# Patient Record
Sex: Female | Born: 1945
Health system: Southern US, Community
[De-identification: ages and names within clinical notes are randomized; demographics above are authoritative.]

## PROBLEM LIST (undated history)

## (undated) DIAGNOSIS — I10 Essential (primary) hypertension: Secondary | ICD-10-CM

## (undated) DIAGNOSIS — E669 Obesity, unspecified: Secondary | ICD-10-CM

## (undated) DIAGNOSIS — R0600 Dyspnea, unspecified: Secondary | ICD-10-CM

## (undated) DIAGNOSIS — J302 Other seasonal allergic rhinitis: Secondary | ICD-10-CM

## (undated) DIAGNOSIS — M069 Rheumatoid arthritis, unspecified: Secondary | ICD-10-CM

## (undated) DIAGNOSIS — J45909 Unspecified asthma, uncomplicated: Secondary | ICD-10-CM

## (undated) DIAGNOSIS — R011 Cardiac murmur, unspecified: Secondary | ICD-10-CM

## (undated) DIAGNOSIS — R7303 Prediabetes: Secondary | ICD-10-CM

## (undated) DIAGNOSIS — K219 Gastro-esophageal reflux disease without esophagitis: Secondary | ICD-10-CM

## (undated) DIAGNOSIS — M1990 Unspecified osteoarthritis, unspecified site: Secondary | ICD-10-CM

## (undated) DIAGNOSIS — I350 Nonrheumatic aortic (valve) stenosis: Secondary | ICD-10-CM

## (undated) DIAGNOSIS — E785 Hyperlipidemia, unspecified: Secondary | ICD-10-CM

## (undated) DIAGNOSIS — I2510 Atherosclerotic heart disease of native coronary artery without angina pectoris: Secondary | ICD-10-CM

## (undated) HISTORY — DX: Essential (primary) hypertension: I10

## (undated) HISTORY — DX: Obesity, unspecified: E66.9

## (undated) HISTORY — DX: Nonrheumatic aortic (valve) stenosis: I35.0

## (undated) HISTORY — DX: Hyperlipidemia, unspecified: E78.5

## (undated) HISTORY — DX: Rheumatoid arthritis, unspecified: M06.9

## (undated) HISTORY — DX: Atherosclerotic heart disease of native coronary artery without angina pectoris: I25.10

---

## 1998-03-30 ENCOUNTER — Other Ambulatory Visit: Admission: RE | Admit: 1998-03-30 | Discharge: 1998-03-30 | Payer: Self-pay | Admitting: Gynecology

## 1999-07-13 ENCOUNTER — Other Ambulatory Visit: Admission: RE | Admit: 1999-07-13 | Discharge: 1999-07-13 | Payer: Self-pay | Admitting: Gynecology

## 2000-09-10 ENCOUNTER — Other Ambulatory Visit: Admission: RE | Admit: 2000-09-10 | Discharge: 2000-09-10 | Payer: Self-pay | Admitting: Gynecology

## 2002-01-08 ENCOUNTER — Other Ambulatory Visit: Admission: RE | Admit: 2002-01-08 | Discharge: 2002-01-08 | Payer: Self-pay | Admitting: Gynecology

## 2003-05-25 ENCOUNTER — Other Ambulatory Visit: Admission: RE | Admit: 2003-05-25 | Discharge: 2003-05-25 | Payer: Self-pay | Admitting: Gynecology

## 2004-08-03 ENCOUNTER — Other Ambulatory Visit: Admission: RE | Admit: 2004-08-03 | Discharge: 2004-08-03 | Payer: Self-pay | Admitting: Gynecology

## 2015-02-28 DIAGNOSIS — E119 Type 2 diabetes mellitus without complications: Secondary | ICD-10-CM | POA: Diagnosis not present

## 2015-02-28 DIAGNOSIS — E782 Mixed hyperlipidemia: Secondary | ICD-10-CM | POA: Diagnosis not present

## 2015-03-04 DIAGNOSIS — E119 Type 2 diabetes mellitus without complications: Secondary | ICD-10-CM | POA: Diagnosis not present

## 2015-03-04 DIAGNOSIS — E782 Mixed hyperlipidemia: Secondary | ICD-10-CM | POA: Diagnosis not present

## 2015-03-04 DIAGNOSIS — I10 Essential (primary) hypertension: Secondary | ICD-10-CM | POA: Diagnosis not present

## 2015-03-04 DIAGNOSIS — Z9181 History of falling: Secondary | ICD-10-CM | POA: Diagnosis not present

## 2015-03-04 DIAGNOSIS — Z1389 Encounter for screening for other disorder: Secondary | ICD-10-CM | POA: Diagnosis not present

## 2015-03-04 DIAGNOSIS — E663 Overweight: Secondary | ICD-10-CM | POA: Diagnosis not present

## 2015-09-01 DIAGNOSIS — E119 Type 2 diabetes mellitus without complications: Secondary | ICD-10-CM | POA: Diagnosis not present

## 2015-09-01 DIAGNOSIS — E782 Mixed hyperlipidemia: Secondary | ICD-10-CM | POA: Diagnosis not present

## 2015-09-07 DIAGNOSIS — E119 Type 2 diabetes mellitus without complications: Secondary | ICD-10-CM | POA: Diagnosis not present

## 2015-09-07 DIAGNOSIS — I10 Essential (primary) hypertension: Secondary | ICD-10-CM | POA: Diagnosis not present

## 2015-09-07 DIAGNOSIS — E782 Mixed hyperlipidemia: Secondary | ICD-10-CM | POA: Diagnosis not present

## 2015-09-28 DIAGNOSIS — H2513 Age-related nuclear cataract, bilateral: Secondary | ICD-10-CM | POA: Diagnosis not present

## 2016-05-18 DIAGNOSIS — B369 Superficial mycosis, unspecified: Secondary | ICD-10-CM | POA: Diagnosis not present

## 2016-05-24 DIAGNOSIS — Z1231 Encounter for screening mammogram for malignant neoplasm of breast: Secondary | ICD-10-CM | POA: Diagnosis not present

## 2016-05-24 DIAGNOSIS — Z01419 Encounter for gynecological examination (general) (routine) without abnormal findings: Secondary | ICD-10-CM | POA: Diagnosis not present

## 2016-09-10 DIAGNOSIS — E119 Type 2 diabetes mellitus without complications: Secondary | ICD-10-CM | POA: Diagnosis not present

## 2016-09-10 DIAGNOSIS — E782 Mixed hyperlipidemia: Secondary | ICD-10-CM | POA: Diagnosis not present

## 2016-09-13 DIAGNOSIS — I10 Essential (primary) hypertension: Secondary | ICD-10-CM | POA: Diagnosis not present

## 2016-09-13 DIAGNOSIS — E119 Type 2 diabetes mellitus without complications: Secondary | ICD-10-CM | POA: Diagnosis not present

## 2016-09-13 DIAGNOSIS — Z139 Encounter for screening, unspecified: Secondary | ICD-10-CM | POA: Diagnosis not present

## 2016-09-13 DIAGNOSIS — Z1389 Encounter for screening for other disorder: Secondary | ICD-10-CM | POA: Diagnosis not present

## 2016-09-13 DIAGNOSIS — E782 Mixed hyperlipidemia: Secondary | ICD-10-CM | POA: Diagnosis not present

## 2016-09-13 DIAGNOSIS — Z9181 History of falling: Secondary | ICD-10-CM | POA: Diagnosis not present

## 2016-09-13 DIAGNOSIS — L239 Allergic contact dermatitis, unspecified cause: Secondary | ICD-10-CM | POA: Diagnosis not present

## 2016-10-16 DIAGNOSIS — Z23 Encounter for immunization: Secondary | ICD-10-CM | POA: Diagnosis not present

## 2016-10-18 DIAGNOSIS — Z23 Encounter for immunization: Secondary | ICD-10-CM | POA: Diagnosis not present

## 2016-10-18 DIAGNOSIS — Z0000 Encounter for general adult medical examination without abnormal findings: Secondary | ICD-10-CM | POA: Diagnosis not present

## 2016-10-18 DIAGNOSIS — Z139 Encounter for screening, unspecified: Secondary | ICD-10-CM | POA: Diagnosis not present

## 2016-10-18 DIAGNOSIS — Z136 Encounter for screening for cardiovascular disorders: Secondary | ICD-10-CM | POA: Diagnosis not present

## 2016-10-18 DIAGNOSIS — E785 Hyperlipidemia, unspecified: Secondary | ICD-10-CM | POA: Diagnosis not present

## 2016-11-01 DIAGNOSIS — H2513 Age-related nuclear cataract, bilateral: Secondary | ICD-10-CM | POA: Diagnosis not present

## 2016-11-01 DIAGNOSIS — E119 Type 2 diabetes mellitus without complications: Secondary | ICD-10-CM | POA: Diagnosis not present

## 2016-11-13 DIAGNOSIS — Z1212 Encounter for screening for malignant neoplasm of rectum: Secondary | ICD-10-CM | POA: Diagnosis not present

## 2016-11-13 DIAGNOSIS — Z1211 Encounter for screening for malignant neoplasm of colon: Secondary | ICD-10-CM | POA: Diagnosis not present

## 2016-11-15 DIAGNOSIS — L821 Other seborrheic keratosis: Secondary | ICD-10-CM | POA: Diagnosis not present

## 2016-11-15 DIAGNOSIS — L570 Actinic keratosis: Secondary | ICD-10-CM | POA: Diagnosis not present

## 2016-11-15 DIAGNOSIS — D225 Melanocytic nevi of trunk: Secondary | ICD-10-CM | POA: Diagnosis not present

## 2016-11-15 DIAGNOSIS — L814 Other melanin hyperpigmentation: Secondary | ICD-10-CM | POA: Diagnosis not present

## 2016-11-15 DIAGNOSIS — Z85828 Personal history of other malignant neoplasm of skin: Secondary | ICD-10-CM | POA: Diagnosis not present

## 2017-03-11 DIAGNOSIS — E782 Mixed hyperlipidemia: Secondary | ICD-10-CM | POA: Diagnosis not present

## 2017-03-11 DIAGNOSIS — E119 Type 2 diabetes mellitus without complications: Secondary | ICD-10-CM | POA: Diagnosis not present

## 2017-03-14 DIAGNOSIS — E782 Mixed hyperlipidemia: Secondary | ICD-10-CM | POA: Diagnosis not present

## 2017-03-14 DIAGNOSIS — I10 Essential (primary) hypertension: Secondary | ICD-10-CM | POA: Diagnosis not present

## 2017-03-14 DIAGNOSIS — Z1331 Encounter for screening for depression: Secondary | ICD-10-CM | POA: Diagnosis not present

## 2017-03-14 DIAGNOSIS — E119 Type 2 diabetes mellitus without complications: Secondary | ICD-10-CM | POA: Diagnosis not present

## 2017-03-14 DIAGNOSIS — Z9181 History of falling: Secondary | ICD-10-CM | POA: Diagnosis not present

## 2017-06-27 DIAGNOSIS — Z1231 Encounter for screening mammogram for malignant neoplasm of breast: Secondary | ICD-10-CM | POA: Diagnosis not present

## 2017-06-27 DIAGNOSIS — Z01419 Encounter for gynecological examination (general) (routine) without abnormal findings: Secondary | ICD-10-CM | POA: Diagnosis not present

## 2017-09-16 DIAGNOSIS — E782 Mixed hyperlipidemia: Secondary | ICD-10-CM | POA: Diagnosis not present

## 2017-09-16 DIAGNOSIS — E119 Type 2 diabetes mellitus without complications: Secondary | ICD-10-CM | POA: Diagnosis not present

## 2017-09-26 DIAGNOSIS — E119 Type 2 diabetes mellitus without complications: Secondary | ICD-10-CM | POA: Diagnosis not present

## 2017-09-26 DIAGNOSIS — I10 Essential (primary) hypertension: Secondary | ICD-10-CM | POA: Diagnosis not present

## 2017-09-26 DIAGNOSIS — E782 Mixed hyperlipidemia: Secondary | ICD-10-CM | POA: Diagnosis not present

## 2017-09-26 DIAGNOSIS — Z23 Encounter for immunization: Secondary | ICD-10-CM | POA: Diagnosis not present

## 2017-09-26 DIAGNOSIS — Z139 Encounter for screening, unspecified: Secondary | ICD-10-CM | POA: Diagnosis not present

## 2017-09-26 DIAGNOSIS — M8580 Other specified disorders of bone density and structure, unspecified site: Secondary | ICD-10-CM | POA: Diagnosis not present

## 2017-10-21 DIAGNOSIS — Z139 Encounter for screening, unspecified: Secondary | ICD-10-CM | POA: Diagnosis not present

## 2017-10-21 DIAGNOSIS — E785 Hyperlipidemia, unspecified: Secondary | ICD-10-CM | POA: Diagnosis not present

## 2017-10-21 DIAGNOSIS — Z0000 Encounter for general adult medical examination without abnormal findings: Secondary | ICD-10-CM | POA: Diagnosis not present

## 2017-10-21 DIAGNOSIS — Z136 Encounter for screening for cardiovascular disorders: Secondary | ICD-10-CM | POA: Diagnosis not present

## 2017-10-21 DIAGNOSIS — Z9181 History of falling: Secondary | ICD-10-CM | POA: Diagnosis not present

## 2017-11-21 DIAGNOSIS — L570 Actinic keratosis: Secondary | ICD-10-CM | POA: Diagnosis not present

## 2017-11-21 DIAGNOSIS — C44519 Basal cell carcinoma of skin of other part of trunk: Secondary | ICD-10-CM | POA: Diagnosis not present

## 2017-11-21 DIAGNOSIS — L814 Other melanin hyperpigmentation: Secondary | ICD-10-CM | POA: Diagnosis not present

## 2017-11-21 DIAGNOSIS — D0471 Carcinoma in situ of skin of right lower limb, including hip: Secondary | ICD-10-CM | POA: Diagnosis not present

## 2017-11-21 DIAGNOSIS — D1801 Hemangioma of skin and subcutaneous tissue: Secondary | ICD-10-CM | POA: Diagnosis not present

## 2017-11-21 DIAGNOSIS — L918 Other hypertrophic disorders of the skin: Secondary | ICD-10-CM | POA: Diagnosis not present

## 2017-11-21 DIAGNOSIS — Z85828 Personal history of other malignant neoplasm of skin: Secondary | ICD-10-CM | POA: Diagnosis not present

## 2018-01-09 DIAGNOSIS — C44722 Squamous cell carcinoma of skin of right lower limb, including hip: Secondary | ICD-10-CM | POA: Diagnosis not present

## 2018-01-09 DIAGNOSIS — C44519 Basal cell carcinoma of skin of other part of trunk: Secondary | ICD-10-CM | POA: Diagnosis not present

## 2018-03-25 DIAGNOSIS — E782 Mixed hyperlipidemia: Secondary | ICD-10-CM | POA: Diagnosis not present

## 2018-03-25 DIAGNOSIS — E119 Type 2 diabetes mellitus without complications: Secondary | ICD-10-CM | POA: Diagnosis not present

## 2018-03-31 DIAGNOSIS — E119 Type 2 diabetes mellitus without complications: Secondary | ICD-10-CM | POA: Diagnosis not present

## 2018-03-31 DIAGNOSIS — E782 Mixed hyperlipidemia: Secondary | ICD-10-CM | POA: Diagnosis not present

## 2018-03-31 DIAGNOSIS — I10 Essential (primary) hypertension: Secondary | ICD-10-CM | POA: Diagnosis not present

## 2018-03-31 DIAGNOSIS — M25541 Pain in joints of right hand: Secondary | ICD-10-CM | POA: Diagnosis not present

## 2018-03-31 DIAGNOSIS — M8580 Other specified disorders of bone density and structure, unspecified site: Secondary | ICD-10-CM | POA: Diagnosis not present

## 2018-04-10 DIAGNOSIS — M79643 Pain in unspecified hand: Secondary | ICD-10-CM | POA: Diagnosis not present

## 2018-04-10 DIAGNOSIS — M8580 Other specified disorders of bone density and structure, unspecified site: Secondary | ICD-10-CM | POA: Diagnosis not present

## 2018-04-10 DIAGNOSIS — M19042 Primary osteoarthritis, left hand: Secondary | ICD-10-CM | POA: Diagnosis not present

## 2018-04-10 DIAGNOSIS — R768 Other specified abnormal immunological findings in serum: Secondary | ICD-10-CM | POA: Diagnosis not present

## 2018-04-10 DIAGNOSIS — M1990 Unspecified osteoarthritis, unspecified site: Secondary | ICD-10-CM | POA: Diagnosis not present

## 2018-04-10 DIAGNOSIS — M8589 Other specified disorders of bone density and structure, multiple sites: Secondary | ICD-10-CM | POA: Diagnosis not present

## 2018-04-10 DIAGNOSIS — M79641 Pain in right hand: Secondary | ICD-10-CM | POA: Diagnosis not present

## 2018-04-10 DIAGNOSIS — M79642 Pain in left hand: Secondary | ICD-10-CM | POA: Diagnosis not present

## 2018-04-10 DIAGNOSIS — M064 Inflammatory polyarthropathy: Secondary | ICD-10-CM | POA: Diagnosis not present

## 2018-04-10 DIAGNOSIS — M19041 Primary osteoarthritis, right hand: Secondary | ICD-10-CM | POA: Diagnosis not present

## 2018-04-17 DIAGNOSIS — R768 Other specified abnormal immunological findings in serum: Secondary | ICD-10-CM | POA: Diagnosis not present

## 2018-05-15 DIAGNOSIS — M79643 Pain in unspecified hand: Secondary | ICD-10-CM | POA: Diagnosis not present

## 2018-05-15 DIAGNOSIS — R768 Other specified abnormal immunological findings in serum: Secondary | ICD-10-CM | POA: Diagnosis not present

## 2018-05-15 DIAGNOSIS — M8580 Other specified disorders of bone density and structure, unspecified site: Secondary | ICD-10-CM | POA: Diagnosis not present

## 2018-05-15 DIAGNOSIS — M1990 Unspecified osteoarthritis, unspecified site: Secondary | ICD-10-CM | POA: Diagnosis not present

## 2018-05-15 DIAGNOSIS — M064 Inflammatory polyarthropathy: Secondary | ICD-10-CM | POA: Diagnosis not present

## 2018-05-30 DIAGNOSIS — R768 Other specified abnormal immunological findings in serum: Secondary | ICD-10-CM | POA: Diagnosis not present

## 2018-05-30 DIAGNOSIS — M1990 Unspecified osteoarthritis, unspecified site: Secondary | ICD-10-CM | POA: Diagnosis not present

## 2018-05-30 DIAGNOSIS — M8580 Other specified disorders of bone density and structure, unspecified site: Secondary | ICD-10-CM | POA: Diagnosis not present

## 2018-05-30 DIAGNOSIS — M79643 Pain in unspecified hand: Secondary | ICD-10-CM | POA: Diagnosis not present

## 2018-05-30 DIAGNOSIS — M064 Inflammatory polyarthropathy: Secondary | ICD-10-CM | POA: Diagnosis not present

## 2018-08-14 DIAGNOSIS — Z1231 Encounter for screening mammogram for malignant neoplasm of breast: Secondary | ICD-10-CM | POA: Diagnosis not present

## 2018-08-21 DIAGNOSIS — N390 Urinary tract infection, site not specified: Secondary | ICD-10-CM | POA: Diagnosis not present

## 2018-08-28 DIAGNOSIS — M79643 Pain in unspecified hand: Secondary | ICD-10-CM | POA: Diagnosis not present

## 2018-08-28 DIAGNOSIS — M8580 Other specified disorders of bone density and structure, unspecified site: Secondary | ICD-10-CM | POA: Diagnosis not present

## 2018-08-28 DIAGNOSIS — M1990 Unspecified osteoarthritis, unspecified site: Secondary | ICD-10-CM | POA: Diagnosis not present

## 2018-08-28 DIAGNOSIS — M064 Inflammatory polyarthropathy: Secondary | ICD-10-CM | POA: Diagnosis not present

## 2018-08-28 DIAGNOSIS — R768 Other specified abnormal immunological findings in serum: Secondary | ICD-10-CM | POA: Diagnosis not present

## 2018-09-29 DIAGNOSIS — E119 Type 2 diabetes mellitus without complications: Secondary | ICD-10-CM | POA: Diagnosis not present

## 2018-09-29 DIAGNOSIS — E782 Mixed hyperlipidemia: Secondary | ICD-10-CM | POA: Diagnosis not present

## 2018-09-29 DIAGNOSIS — M0600 Rheumatoid arthritis without rheumatoid factor, unspecified site: Secondary | ICD-10-CM | POA: Diagnosis not present

## 2018-10-01 DIAGNOSIS — Z23 Encounter for immunization: Secondary | ICD-10-CM | POA: Diagnosis not present

## 2018-10-01 DIAGNOSIS — M0600 Rheumatoid arthritis without rheumatoid factor, unspecified site: Secondary | ICD-10-CM | POA: Diagnosis not present

## 2018-10-01 DIAGNOSIS — I10 Essential (primary) hypertension: Secondary | ICD-10-CM | POA: Diagnosis not present

## 2018-10-01 DIAGNOSIS — E782 Mixed hyperlipidemia: Secondary | ICD-10-CM | POA: Diagnosis not present

## 2018-10-01 DIAGNOSIS — E119 Type 2 diabetes mellitus without complications: Secondary | ICD-10-CM | POA: Diagnosis not present

## 2018-10-16 DIAGNOSIS — H2513 Age-related nuclear cataract, bilateral: Secondary | ICD-10-CM | POA: Diagnosis not present

## 2018-10-16 DIAGNOSIS — E119 Type 2 diabetes mellitus without complications: Secondary | ICD-10-CM | POA: Diagnosis not present

## 2018-11-06 DIAGNOSIS — Z9181 History of falling: Secondary | ICD-10-CM | POA: Diagnosis not present

## 2018-11-06 DIAGNOSIS — Z139 Encounter for screening, unspecified: Secondary | ICD-10-CM | POA: Diagnosis not present

## 2018-11-06 DIAGNOSIS — E785 Hyperlipidemia, unspecified: Secondary | ICD-10-CM | POA: Diagnosis not present

## 2018-11-06 DIAGNOSIS — Z0000 Encounter for general adult medical examination without abnormal findings: Secondary | ICD-10-CM | POA: Diagnosis not present

## 2018-11-20 DIAGNOSIS — M1990 Unspecified osteoarthritis, unspecified site: Secondary | ICD-10-CM | POA: Diagnosis not present

## 2018-11-20 DIAGNOSIS — M79643 Pain in unspecified hand: Secondary | ICD-10-CM | POA: Diagnosis not present

## 2018-11-20 DIAGNOSIS — M8580 Other specified disorders of bone density and structure, unspecified site: Secondary | ICD-10-CM | POA: Diagnosis not present

## 2018-11-20 DIAGNOSIS — M064 Inflammatory polyarthropathy: Secondary | ICD-10-CM | POA: Diagnosis not present

## 2018-11-20 DIAGNOSIS — R768 Other specified abnormal immunological findings in serum: Secondary | ICD-10-CM | POA: Diagnosis not present

## 2019-01-01 DIAGNOSIS — M0600 Rheumatoid arthritis without rheumatoid factor, unspecified site: Secondary | ICD-10-CM | POA: Diagnosis not present

## 2019-01-01 DIAGNOSIS — M1990 Unspecified osteoarthritis, unspecified site: Secondary | ICD-10-CM | POA: Diagnosis not present

## 2019-01-01 DIAGNOSIS — M8580 Other specified disorders of bone density and structure, unspecified site: Secondary | ICD-10-CM | POA: Diagnosis not present

## 2019-01-01 DIAGNOSIS — R768 Other specified abnormal immunological findings in serum: Secondary | ICD-10-CM | POA: Diagnosis not present

## 2019-01-01 DIAGNOSIS — Z79899 Other long term (current) drug therapy: Secondary | ICD-10-CM | POA: Diagnosis not present

## 2019-01-08 DIAGNOSIS — D225 Melanocytic nevi of trunk: Secondary | ICD-10-CM | POA: Diagnosis not present

## 2019-01-08 DIAGNOSIS — Z85828 Personal history of other malignant neoplasm of skin: Secondary | ICD-10-CM | POA: Diagnosis not present

## 2019-01-08 DIAGNOSIS — B078 Other viral warts: Secondary | ICD-10-CM | POA: Diagnosis not present

## 2019-01-08 DIAGNOSIS — L918 Other hypertrophic disorders of the skin: Secondary | ICD-10-CM | POA: Diagnosis not present

## 2019-01-08 DIAGNOSIS — L570 Actinic keratosis: Secondary | ICD-10-CM | POA: Diagnosis not present

## 2019-02-05 DIAGNOSIS — R768 Other specified abnormal immunological findings in serum: Secondary | ICD-10-CM | POA: Diagnosis not present

## 2019-03-30 DIAGNOSIS — E119 Type 2 diabetes mellitus without complications: Secondary | ICD-10-CM | POA: Diagnosis not present

## 2019-03-30 DIAGNOSIS — M0600 Rheumatoid arthritis without rheumatoid factor, unspecified site: Secondary | ICD-10-CM | POA: Diagnosis not present

## 2019-03-30 DIAGNOSIS — E782 Mixed hyperlipidemia: Secondary | ICD-10-CM | POA: Diagnosis not present

## 2019-04-01 DIAGNOSIS — M0600 Rheumatoid arthritis without rheumatoid factor, unspecified site: Secondary | ICD-10-CM | POA: Diagnosis not present

## 2019-04-01 DIAGNOSIS — I10 Essential (primary) hypertension: Secondary | ICD-10-CM | POA: Diagnosis not present

## 2019-04-01 DIAGNOSIS — E119 Type 2 diabetes mellitus without complications: Secondary | ICD-10-CM | POA: Diagnosis not present

## 2019-04-01 DIAGNOSIS — M8580 Other specified disorders of bone density and structure, unspecified site: Secondary | ICD-10-CM | POA: Diagnosis not present

## 2019-04-01 DIAGNOSIS — E782 Mixed hyperlipidemia: Secondary | ICD-10-CM | POA: Diagnosis not present

## 2019-04-02 DIAGNOSIS — M8580 Other specified disorders of bone density and structure, unspecified site: Secondary | ICD-10-CM | POA: Diagnosis not present

## 2019-04-02 DIAGNOSIS — R768 Other specified abnormal immunological findings in serum: Secondary | ICD-10-CM | POA: Diagnosis not present

## 2019-04-02 DIAGNOSIS — M0600 Rheumatoid arthritis without rheumatoid factor, unspecified site: Secondary | ICD-10-CM | POA: Diagnosis not present

## 2019-04-02 DIAGNOSIS — Z79899 Other long term (current) drug therapy: Secondary | ICD-10-CM | POA: Diagnosis not present

## 2019-04-02 DIAGNOSIS — M1990 Unspecified osteoarthritis, unspecified site: Secondary | ICD-10-CM | POA: Diagnosis not present

## 2019-07-02 DIAGNOSIS — Z79899 Other long term (current) drug therapy: Secondary | ICD-10-CM | POA: Diagnosis not present

## 2019-07-02 DIAGNOSIS — M0600 Rheumatoid arthritis without rheumatoid factor, unspecified site: Secondary | ICD-10-CM | POA: Diagnosis not present

## 2019-09-30 DIAGNOSIS — E119 Type 2 diabetes mellitus without complications: Secondary | ICD-10-CM | POA: Diagnosis not present

## 2019-09-30 DIAGNOSIS — E782 Mixed hyperlipidemia: Secondary | ICD-10-CM | POA: Diagnosis not present

## 2019-09-30 DIAGNOSIS — Z79899 Other long term (current) drug therapy: Secondary | ICD-10-CM | POA: Diagnosis not present

## 2019-10-02 DIAGNOSIS — Z23 Encounter for immunization: Secondary | ICD-10-CM | POA: Diagnosis not present

## 2019-10-02 DIAGNOSIS — E119 Type 2 diabetes mellitus without complications: Secondary | ICD-10-CM | POA: Diagnosis not present

## 2019-10-02 DIAGNOSIS — E782 Mixed hyperlipidemia: Secondary | ICD-10-CM | POA: Diagnosis not present

## 2019-10-02 DIAGNOSIS — K219 Gastro-esophageal reflux disease without esophagitis: Secondary | ICD-10-CM | POA: Diagnosis not present

## 2019-10-02 DIAGNOSIS — I10 Essential (primary) hypertension: Secondary | ICD-10-CM | POA: Diagnosis not present

## 2019-11-05 DIAGNOSIS — M0600 Rheumatoid arthritis without rheumatoid factor, unspecified site: Secondary | ICD-10-CM | POA: Diagnosis not present

## 2019-11-05 DIAGNOSIS — M1990 Unspecified osteoarthritis, unspecified site: Secondary | ICD-10-CM | POA: Diagnosis not present

## 2019-11-05 DIAGNOSIS — R768 Other specified abnormal immunological findings in serum: Secondary | ICD-10-CM | POA: Diagnosis not present

## 2019-11-05 DIAGNOSIS — Z79899 Other long term (current) drug therapy: Secondary | ICD-10-CM | POA: Diagnosis not present

## 2019-11-05 DIAGNOSIS — M8580 Other specified disorders of bone density and structure, unspecified site: Secondary | ICD-10-CM | POA: Diagnosis not present

## 2019-12-01 DIAGNOSIS — Z1211 Encounter for screening for malignant neoplasm of colon: Secondary | ICD-10-CM | POA: Diagnosis not present

## 2019-12-01 DIAGNOSIS — Z1212 Encounter for screening for malignant neoplasm of rectum: Secondary | ICD-10-CM | POA: Diagnosis not present

## 2019-12-08 LAB — COLOGUARD: COLOGUARD: NEGATIVE

## 2019-12-17 DIAGNOSIS — E119 Type 2 diabetes mellitus without complications: Secondary | ICD-10-CM | POA: Diagnosis not present

## 2019-12-17 DIAGNOSIS — M1380 Other specified arthritis, unspecified site: Secondary | ICD-10-CM | POA: Diagnosis not present

## 2019-12-17 DIAGNOSIS — H2513 Age-related nuclear cataract, bilateral: Secondary | ICD-10-CM | POA: Diagnosis not present

## 2019-12-17 DIAGNOSIS — Z79899 Other long term (current) drug therapy: Secondary | ICD-10-CM | POA: Diagnosis not present

## 2019-12-24 DIAGNOSIS — Z1231 Encounter for screening mammogram for malignant neoplasm of breast: Secondary | ICD-10-CM | POA: Diagnosis not present

## 2020-01-14 DIAGNOSIS — L308 Other specified dermatitis: Secondary | ICD-10-CM | POA: Diagnosis not present

## 2020-01-14 DIAGNOSIS — Z85828 Personal history of other malignant neoplasm of skin: Secondary | ICD-10-CM | POA: Diagnosis not present

## 2020-01-14 DIAGNOSIS — L814 Other melanin hyperpigmentation: Secondary | ICD-10-CM | POA: Diagnosis not present

## 2020-01-14 DIAGNOSIS — R21 Rash and other nonspecific skin eruption: Secondary | ICD-10-CM | POA: Diagnosis not present

## 2020-01-14 DIAGNOSIS — C44519 Basal cell carcinoma of skin of other part of trunk: Secondary | ICD-10-CM | POA: Diagnosis not present

## 2020-01-14 DIAGNOSIS — L570 Actinic keratosis: Secondary | ICD-10-CM | POA: Diagnosis not present

## 2020-01-14 DIAGNOSIS — D692 Other nonthrombocytopenic purpura: Secondary | ICD-10-CM | POA: Diagnosis not present

## 2020-01-14 DIAGNOSIS — L821 Other seborrheic keratosis: Secondary | ICD-10-CM | POA: Diagnosis not present

## 2020-01-14 DIAGNOSIS — L400 Psoriasis vulgaris: Secondary | ICD-10-CM | POA: Diagnosis not present

## 2020-01-14 DIAGNOSIS — L918 Other hypertrophic disorders of the skin: Secondary | ICD-10-CM | POA: Diagnosis not present

## 2020-02-11 DIAGNOSIS — M1990 Unspecified osteoarthritis, unspecified site: Secondary | ICD-10-CM | POA: Diagnosis not present

## 2020-02-11 DIAGNOSIS — M25519 Pain in unspecified shoulder: Secondary | ICD-10-CM | POA: Diagnosis not present

## 2020-02-11 DIAGNOSIS — Z79899 Other long term (current) drug therapy: Secondary | ICD-10-CM | POA: Diagnosis not present

## 2020-02-11 DIAGNOSIS — M0600 Rheumatoid arthritis without rheumatoid factor, unspecified site: Secondary | ICD-10-CM | POA: Diagnosis not present

## 2020-02-11 DIAGNOSIS — M8580 Other specified disorders of bone density and structure, unspecified site: Secondary | ICD-10-CM | POA: Diagnosis not present

## 2020-02-11 DIAGNOSIS — R768 Other specified abnormal immunological findings in serum: Secondary | ICD-10-CM | POA: Diagnosis not present

## 2020-03-29 DIAGNOSIS — Z79899 Other long term (current) drug therapy: Secondary | ICD-10-CM | POA: Diagnosis not present

## 2020-03-29 DIAGNOSIS — E1169 Type 2 diabetes mellitus with other specified complication: Secondary | ICD-10-CM | POA: Diagnosis not present

## 2020-04-01 DIAGNOSIS — E785 Hyperlipidemia, unspecified: Secondary | ICD-10-CM | POA: Diagnosis not present

## 2020-04-01 DIAGNOSIS — E782 Mixed hyperlipidemia: Secondary | ICD-10-CM | POA: Diagnosis not present

## 2020-04-01 DIAGNOSIS — M7918 Myalgia, other site: Secondary | ICD-10-CM | POA: Diagnosis not present

## 2020-04-01 DIAGNOSIS — Z9181 History of falling: Secondary | ICD-10-CM | POA: Diagnosis not present

## 2020-04-01 DIAGNOSIS — I10 Essential (primary) hypertension: Secondary | ICD-10-CM | POA: Diagnosis not present

## 2020-04-01 DIAGNOSIS — M0600 Rheumatoid arthritis without rheumatoid factor, unspecified site: Secondary | ICD-10-CM | POA: Diagnosis not present

## 2020-04-01 DIAGNOSIS — K219 Gastro-esophageal reflux disease without esophagitis: Secondary | ICD-10-CM | POA: Diagnosis not present

## 2020-04-01 DIAGNOSIS — R011 Cardiac murmur, unspecified: Secondary | ICD-10-CM | POA: Diagnosis not present

## 2020-04-01 DIAGNOSIS — Z139 Encounter for screening, unspecified: Secondary | ICD-10-CM | POA: Diagnosis not present

## 2020-04-01 DIAGNOSIS — E1169 Type 2 diabetes mellitus with other specified complication: Secondary | ICD-10-CM | POA: Diagnosis not present

## 2020-04-04 ENCOUNTER — Other Ambulatory Visit (HOSPITAL_COMMUNITY): Payer: Self-pay | Admitting: Family Medicine

## 2020-04-04 DIAGNOSIS — R011 Cardiac murmur, unspecified: Secondary | ICD-10-CM

## 2020-04-28 DIAGNOSIS — L400 Psoriasis vulgaris: Secondary | ICD-10-CM | POA: Diagnosis not present

## 2020-04-28 DIAGNOSIS — Z85828 Personal history of other malignant neoplasm of skin: Secondary | ICD-10-CM | POA: Diagnosis not present

## 2020-05-06 ENCOUNTER — Other Ambulatory Visit: Payer: Self-pay

## 2020-05-06 ENCOUNTER — Ambulatory Visit (HOSPITAL_COMMUNITY)
Admission: RE | Admit: 2020-05-06 | Discharge: 2020-05-06 | Disposition: A | Discharge status: Home / Self Care | Payer: Medicare Other | Source: Ambulatory Visit | Attending: Family Medicine | Admitting: Family Medicine

## 2020-05-06 DIAGNOSIS — I10 Essential (primary) hypertension: Secondary | ICD-10-CM | POA: Insufficient documentation

## 2020-05-06 DIAGNOSIS — M0600 Rheumatoid arthritis without rheumatoid factor, unspecified site: Secondary | ICD-10-CM | POA: Insufficient documentation

## 2020-05-06 DIAGNOSIS — E1169 Type 2 diabetes mellitus with other specified complication: Secondary | ICD-10-CM | POA: Diagnosis not present

## 2020-05-06 DIAGNOSIS — K219 Gastro-esophageal reflux disease without esophagitis: Secondary | ICD-10-CM | POA: Insufficient documentation

## 2020-05-06 DIAGNOSIS — E782 Mixed hyperlipidemia: Secondary | ICD-10-CM | POA: Diagnosis not present

## 2020-05-06 DIAGNOSIS — E785 Hyperlipidemia, unspecified: Secondary | ICD-10-CM | POA: Diagnosis not present

## 2020-05-06 DIAGNOSIS — R011 Cardiac murmur, unspecified: Secondary | ICD-10-CM

## 2020-05-06 DIAGNOSIS — M7918 Myalgia, other site: Secondary | ICD-10-CM | POA: Diagnosis not present

## 2020-05-06 LAB — ECHOCARDIOGRAM COMPLETE
AR max vel: 0.69 cm2
AV Area VTI: 0.7 cm2
AV Area mean vel: 0.63 cm2
AV Mean grad: 27.6 mmHg
AV Peak grad: 43 mmHg
Ao pk vel: 3.28 m/s
Area-P 1/2: 3.27 cm2
S' Lateral: 2.1 cm

## 2020-05-06 NOTE — Progress Notes (Signed)
  Echocardiogram 2D Echocardiogram has been performed.  Shelley Berg 05/06/2020, 4:04 PM

## 2020-07-05 ENCOUNTER — Other Ambulatory Visit: Payer: Self-pay

## 2020-07-05 ENCOUNTER — Ambulatory Visit: Payer: Medicare Other | Admitting: Cardiology

## 2020-07-05 ENCOUNTER — Encounter: Payer: Self-pay | Admitting: Cardiology

## 2020-07-05 VITALS — BP 138/74 | HR 71 | Ht 59.0 in | Wt 149.0 lb

## 2020-07-05 DIAGNOSIS — E7800 Pure hypercholesterolemia, unspecified: Secondary | ICD-10-CM

## 2020-07-05 DIAGNOSIS — R072 Precordial pain: Secondary | ICD-10-CM | POA: Diagnosis not present

## 2020-07-05 DIAGNOSIS — I350 Nonrheumatic aortic (valve) stenosis: Secondary | ICD-10-CM

## 2020-07-05 DIAGNOSIS — I10 Essential (primary) hypertension: Secondary | ICD-10-CM | POA: Diagnosis not present

## 2020-07-05 MED ORDER — NITROGLYCERIN 0.4 MG SL SUBL: 0.4000 mg | SUBLINGUAL_TABLET | SUBLINGUAL | 0 refills | Status: DC | PRN

## 2020-07-05 NOTE — Progress Notes (Signed)
Cardiology Office Note:    Date:  07/05/2020   ID:  Shelley Berg, DOB 10/16/1945, MRN [id number]  PCP:  Leonides Sake, MD   [facility] Providers Cardiologist:  Kate Sable, MD     Referring MD: Leonides Sake, MD   Chief Complaint  Patient presents with   New Patient (Initial Visit)    Referred by PCP for murmur. Meds reviewed verbally with patient.     History of Present Illness:    Shelley Berg is a 75 y.o. female with a hx of hypertension, hyperlipidemia who presents due to a cardiac murmur.  Patient went to see primary care provider for scheduled visit, murmur was noted on cardiac exam.  States having chest pain/tightness associated with exertion, ongoing over the past year.  Denies shortness of breath, denies edema.  Family history of MI in her dad age 73s, 2 brothers had bypass surgeries around age 20s.  Takes medications as prescribed.  Last cholesterol checked at PCP was noted to be normal.  Past Medical History:  Diagnosis Date   Aortic stenosis    Hyperlipidemia    Hypertension     History reviewed. No pertinent surgical history.  Current Medications: Current Meds  Medication Sig   aspirin 81 MG chewable tablet Chew 81 mg by mouth daily.   augmented betamethasone dipropionate (DIPROLENE-AF) 0.05 % cream Apply 1 application topically 2 (two) times daily.   folic acid (FOLVITE) 1 MG tablet Take 1 mg by mouth daily.   hydroxychloroquine (PLAQUENIL) 200 MG tablet Take 200 mg by mouth 2 (two) times daily.   lisinopril (ZESTRIL) 20 MG tablet Take 20 mg by mouth daily.   loratadine (CLARITIN) 10 MG tablet Claritin 10 mg tablet  Take 1 tablet every day by oral route.   methotrexate (RHEUMATREX) 2.5 MG tablet Take 4 tablets every week   nitroGLYCERIN (NITROSTAT) 0.4 MG SL tablet Place 1 tablet (0.4 mg total) under the tongue every 5 (five) minutes as needed for chest pain. Up to 3 tabs daily maximum.   omeprazole (PRILOSEC) 20 MG capsule Take 20 mg  by mouth daily.   pravastatin (PRAVACHOL) 80 MG tablet Take 80 mg by mouth daily.     Allergies:   Penicillins   Social History   Socioeconomic History   Marital status: Married    Spouse name: Not on file   Number of children: Not on file   Years of education: Not on file   Highest education level: Not on file  Occupational History   Not on file  Tobacco Use   Smoking status: Never    Passive exposure: Never   Smokeless tobacco: Never  Vaping Use   Vaping Use: Never used  Substance and Sexual Activity   Alcohol use: Never   Drug use: Never   Sexual activity: Not on file  Other Topics Concern   Not on file  Social History Narrative   Not on file   Social Determinants of Health   Financial Resource Strain: Not on file  Food Insecurity: Not on file  Transportation Needs: Not on file  Physical Activity: Not on file  Stress: Not on file  Social Connections: Not on file     Family History: The patient's family history includes Heart attack in her father; Stroke in her father and mother.  ROS:   Please see the history of present illness.     All other systems reviewed and are negative.  EKGs/Labs/Other Studies Reviewed:  The following studies were reviewed today:   EKG:  EKG is  ordered today.  The ekg ordered today demonstrates normal sinus rhythm, possible old inferior infarct  Recent Labs: No results found for requested labs within last 8760 hours.  Recent Lipid Panel No results found for: CHOL, TRIG, HDL, CHOLHDL, VLDL, LDLCALC, LDLDIRECT   Risk Assessment/Calculations:        Physical Exam:    VS:  BP 138/74 (BP Location: Right Arm, Patient Position: Sitting, Cuff Size: Normal)   Pulse 71   Ht 4\' 11"  (1.499 m)   Wt 149 lb (67.6 kg)   SpO2 96%   BMI 30.09 kg/m     Wt Readings from Last 3 Encounters:  07/05/20 149 lb (67.6 kg)     GEN:  Well nourished, well developed in no acute distress HEENT: Normal NECK: No JVD; No carotid  bruits LYMPHATICS: No lymphadenopathy CARDIAC: RRR, 2/6 systolic murmur RESPIRATORY:  Clear to auscultation without rales, wheezing or rhonchi  ABDOMEN: Soft, non-tender, non-distended MUSCULOSKELETAL:  No edema; No deformity  SKIN: Warm and dry NEUROLOGIC:  Alert and oriented x 3 PSYCHIATRIC:  Normal affect   ASSESSMENT:    1. Precordial pain   2. Aortic valve stenosis, etiology of cardiac valve disease unspecified   3. Primary hypertension   4. Pure hypercholesterolemia    PLAN:    In order of problems listed above:  Chest pain consistent with angina.  History of hypertension, hyperlipidemia, family history of CAD.  Continue aspirin, statin as prescribed.  Echo with normal systolic function, EF 29%.  Obtain Lexiscan Myoview to evaluate presence of ischemia.  Provide sublingual nitro glycerin as needed in the interim.  Patient advised to obtain stress testing either this week or early next week, states wanting to go on vacation and would like to do testing after. Moderate aortic valve stenosis, likely cause for murmur.  Monitor serially with echocardiograms yearly. Hypertension, BP controlled.  Continue lisinopril. Hyperlipidemia, continue Pravachol.  Obtain cholesterol results from PCP.  Follow-up after stress testing.   Shared Decision Making/Informed Consent The risks [chest pain, shortness of breath, cardiac arrhythmias, dizziness, blood pressure fluctuations, myocardial infarction, stroke/transient ischemic attack, nausea, vomiting, allergic reaction, radiation exposure, metallic taste sensation and life-threatening complications (estimated to be 1 in 10,000)], benefits (risk stratification, diagnosing coronary artery disease, treatment guidance) and alternatives of a nuclear stress test were discussed in detail with Ms. Lundquist and she agrees to proceed.    Medication Adjustments/Labs and Tests Ordered: Current medicines are reviewed at length with the patient today.   Concerns regarding medicines are outlined above.  Orders Placed This Encounter  Procedures   NM Myocar Multi W/Spect W/Wall Motion / EF   EKG 12-Lead    Meds ordered this encounter  Medications   nitroGLYCERIN (NITROSTAT) 0.4 MG SL tablet    Sig: Place 1 tablet (0.4 mg total) under the tongue every 5 (five) minutes as needed for chest pain. Up to 3 tabs daily maximum.    Dispense:  30 tablet    Refill:  0     Patient Instructions  Medication Instructions:   Your physician has recommended you make the following change in your medication:   Take Nitroglycerin 0.4 MG Sublingual every 5 minutes for a maximum of 3 doses as needed for chest pain.  *If you need a refill on your cardiac medications before your next appointment, please call your pharmacy*   Lab Work: None ordered If you have labs (blood work) drawn  today and your tests are completely normal, you will receive your results only by: MyChart Message (if you have MyChart) OR A paper copy in the mail If you have any lab test that is abnormal or we need to change your treatment, we will call you to review the results.   Testing/Procedures:  [place]   Your caregiver has ordered a Stress Test with nuclear imaging. The purpose of this test is to evaluate the blood supply to your heart muscle. This procedure is referred to as a "Non-Invasive Stress Test." This is because other than having an IV started in your vein, nothing is inserted or "invades" your body. Cardiac stress tests are done to find areas of poor blood flow to the heart by determining the extent of coronary artery disease (CAD). Some patients exercise on a treadmill, which naturally increases the blood flow to your heart, while others who are  unable to walk on a treadmill due to physical limitations have a pharmacologic/chemical stress agent called Lexiscan . This medicine will mimic walking on a treadmill by temporarily increasing your coronary blood flow.       PLEASE REPORT TO [facility] MEDICAL MALL ENTRANCE   THE VOLUNTEERS AT THE FIRST DESK WILL DIRECT YOU WHERE TO GO     *Please note: these test may take anywhere between 2-4 hours to complete       Date of Procedure:_____________________________________   Arrival Time for Procedure:______________________________    PLEASE NOTIFY THE OFFICE AT LEAST 24 HOURS IN ADVANCE IF YOU ARE UNABLE TO KEEP YOUR APPOINTMENT.  [place] 24 HOURS IN ADVANCE IF YOU ARE UNABLE TO KEEP YOUR APPOINTMENT. [phone]       How to prepare for your Myoview test:       1. Do not eat or drink after midnight  2. No caffeine for 24 hours prior to test  3. No smoking 24 hours prior to test.  4. Unless instructed otherwise, Take your medication with a small sips of water.    5.         Ladies, please do not wear dresses. Skirts or pants are appropriate. Please wear a short sleeve shirt.  6. No perfume, cologne or lotion.  7. Wear comfortable walking shoes. No heels!     Follow-Up: At [facility], you and your health needs are our priority.  As part of our continuing mission to provide you with exceptional heart care, we have created designated Provider Care Teams.  These Care Teams include your primary Cardiologist (physician) and Advanced Practice Providers (APPs -  Physician Assistants and Nurse Practitioners) who all work together to provide you with the care you need, when you need it.  We recommend signing up for the patient portal called "MyChart".  Sign up information is provided on this After Visit Summary.  MyChart is used to connect with patients for Virtual Visits (Telemedicine).  Patients are able to view lab/test results, encounter notes, upcoming appointments, etc.  Non-urgent messages can be sent to your provider as well.   To learn more about what you can do with MyChart, go to [email or web address].    Your next appointment:   Follow up  after testing   The format for your next appointment:   In Person  Provider:   Kate Sable, MD   Other Instructions    Signed, Kate Sable, MD  07/05/2020 10:37 AM    [place]

## 2020-07-05 NOTE — Patient Instructions (Signed)
Medication Instructions:   Your physician has recommended you make the following change in your medication:   Take Nitroglycerin 0.4 MG Sublingual every 5 minutes for a maximum of 3 doses as needed for chest pain.  *If you need a refill on your cardiac medications before your next appointment, please call your pharmacy*   Lab Work: None ordered If you have labs (blood work) drawn today and your tests are completely normal, you will receive your results only by: [place] (if you have MyChart) OR A paper copy in the mail If you have any lab test that is abnormal or we need to change your treatment, we will call you to review the results.   Testing/Procedures:  [place]   Your caregiver has ordered a Stress Test with nuclear imaging. The purpose of this test is to evaluate the blood supply to your heart muscle. This procedure is referred to as a "Non-Invasive Stress Test." This is because other than having an IV started in your vein, nothing is inserted or "invades" your body. Cardiac stress tests are done to find areas of poor blood flow to the heart by determining the extent of coronary artery disease (CAD). Some patients exercise on a treadmill, which naturally increases the blood flow to your heart, while others who are  unable to walk on a treadmill due to physical limitations have a pharmacologic/chemical stress agent called Lexiscan . This medicine will mimic walking on a treadmill by temporarily increasing your coronary blood flow.      PLEASE REPORT TO [facility] MEDICAL MALL ENTRANCE   THE VOLUNTEERS AT THE FIRST DESK WILL DIRECT YOU WHERE TO GO     *Please note: these test may take anywhere between 2-4 hours to complete       Date of Procedure:_____________________________________   Arrival Time for Procedure:______________________________    PLEASE NOTIFY THE OFFICE AT LEAST 24 HOURS IN ADVANCE IF YOU ARE UNABLE TO KEEP YOUR APPOINTMENT.  [place] 24 HOURS IN ADVANCE IF YOU ARE UNABLE TO KEEP YOUR APPOINTMENT. [phone]       How to prepare for your Myoview test:       1. Do not eat or drink after midnight  2. No caffeine for 24 hours prior to test  3. No smoking 24 hours prior to test.  4. Unless instructed otherwise, Take your medication with a small sips of water.    5.         Ladies, please do not wear dresses. Skirts or pants are appropriate. Please wear a short sleeve shirt.  6. No perfume, cologne or lotion.  7. Wear comfortable walking shoes. No heels!     Follow-Up: At [facility], you and your health needs are our priority.  As part of our continuing mission to provide you with exceptional heart care, we have created designated Provider Care Teams.  These Care Teams include your primary Cardiologist (physician) and Advanced Practice Providers (APPs -  Physician Assistants and Nurse Practitioners) who all work together to provide you with the care you need, when you need it.  We recommend signing up for the patient portal called "MyChart".  Sign up information is provided on this After Visit Summary.  MyChart is used to connect with patients for Virtual Visits (Telemedicine).  Patients are able to view lab/test results, encounter notes, upcoming appointments, etc.  Non-urgent messages can be sent to your provider as well.   To  learn more about what you can do with MyChart, go to [email or web address].    Your next appointment:   Follow up after testing   The format for your next appointment:   In Person  Provider:   Kate Sable, MD   Other Instructions

## 2020-07-28 ENCOUNTER — Other Ambulatory Visit: Payer: Self-pay

## 2020-07-28 ENCOUNTER — Ambulatory Visit
Admission: RE | Admit: 2020-07-28 | Discharge: 2020-07-28 | Disposition: A | Discharge status: Home / Self Care | Payer: Medicare Other | Source: Ambulatory Visit | Attending: Cardiology | Admitting: Cardiology

## 2020-07-28 DIAGNOSIS — M8580 Other specified disorders of bone density and structure, unspecified site: Secondary | ICD-10-CM | POA: Diagnosis not present

## 2020-07-28 DIAGNOSIS — R768 Other specified abnormal immunological findings in serum: Secondary | ICD-10-CM | POA: Diagnosis not present

## 2020-07-28 DIAGNOSIS — M0600 Rheumatoid arthritis without rheumatoid factor, unspecified site: Secondary | ICD-10-CM | POA: Diagnosis not present

## 2020-07-28 DIAGNOSIS — M1990 Unspecified osteoarthritis, unspecified site: Secondary | ICD-10-CM | POA: Diagnosis not present

## 2020-07-28 DIAGNOSIS — R072 Precordial pain: Secondary | ICD-10-CM | POA: Diagnosis not present

## 2020-07-28 DIAGNOSIS — M25519 Pain in unspecified shoulder: Secondary | ICD-10-CM | POA: Diagnosis not present

## 2020-07-28 DIAGNOSIS — Z79899 Other long term (current) drug therapy: Secondary | ICD-10-CM | POA: Diagnosis not present

## 2020-07-28 LAB — NM MYOCAR MULTI W/SPECT W/WALL MOTION / EF
Estimated workload: 1 METS
Exercise duration (min): 0 min
Exercise duration (sec): 0 s
LV dias vol: 82 mL (ref 46–106)
LV sys vol: 32 mL
MPHR: 147 {beats}/min
Peak HR: 90 {beats}/min
Percent HR: 61 %
Rest HR: 66 {beats}/min
SDS: 6
SRS: 9
SSS: 15
TID: 1.16

## 2020-07-28 MED ORDER — TECHNETIUM TC 99M TETROFOSMIN IV KIT
10.9000 | PACK | Freq: Once | INTRAVENOUS | Status: AC | PRN
  Administered 2020-07-28: 10.9 via INTRAVENOUS

## 2020-07-28 MED ORDER — REGADENOSON 0.4 MG/5ML IV SOLN
0.4000 mg | Freq: Once | INTRAVENOUS | Status: AC
  Administered 2020-07-28: 0.4 mg via INTRAVENOUS

## 2020-07-28 MED ORDER — TECHNETIUM TC 99M TETROFOSMIN IV KIT
31.6600 | PACK | Freq: Once | INTRAVENOUS | Status: AC | PRN
  Administered 2020-07-28: 31.66 via INTRAVENOUS

## 2020-08-01 ENCOUNTER — Telehealth: Payer: Self-pay | Admitting: Cardiology

## 2020-08-01 NOTE — Telephone Encounter (Signed)
Called the patient to give stress test results. Lmtcb on the home telephone listed. Tried the patients cell. Unable to lmom, voicemail is not set up.

## 2020-08-01 NOTE — Telephone Encounter (Signed)
Kate Sable, MD  08/01/2020  1:53 PM EDT Back to Top     Stress test was noted to be abnormal.  Please schedule follow-up appointment for patient with myself if schedule permits or an APP for left heart cathconsideration.  Thank you

## 2020-08-02 NOTE — Telephone Encounter (Signed)
Patient returning call.

## 2020-08-02 NOTE — Telephone Encounter (Signed)
Patient made aware of stress test results and Dr. Thereasa Solo recommendation with verbalized understanding. Patients appt with Dr. Garen Lah moved up to 08/08/20 @ 9am.

## 2020-08-08 ENCOUNTER — Encounter: Payer: Self-pay | Admitting: Cardiology

## 2020-08-08 ENCOUNTER — Ambulatory Visit: Payer: Medicare Other | Admitting: Cardiology

## 2020-08-08 ENCOUNTER — Other Ambulatory Visit: Payer: Self-pay

## 2020-08-08 VITALS — BP 138/82 | HR 72 | Ht 59.0 in | Wt 150.0 lb

## 2020-08-08 DIAGNOSIS — R072 Precordial pain: Secondary | ICD-10-CM | POA: Diagnosis not present

## 2020-08-08 DIAGNOSIS — I350 Nonrheumatic aortic (valve) stenosis: Secondary | ICD-10-CM | POA: Diagnosis not present

## 2020-08-08 DIAGNOSIS — E7800 Pure hypercholesterolemia, unspecified: Secondary | ICD-10-CM | POA: Diagnosis not present

## 2020-08-08 DIAGNOSIS — I10 Essential (primary) hypertension: Secondary | ICD-10-CM | POA: Diagnosis not present

## 2020-08-08 MED ORDER — ATORVASTATIN CALCIUM 80 MG PO TABS: 80.0000 mg | ORAL_TABLET | Freq: Every day | ORAL | 5 refills | Status: DC

## 2020-08-08 NOTE — Patient Instructions (Signed)
Medication Instructions:   Your physician has recommended you make the following change in your medication:    STOP taking Pravachol.  2.    START taking Lipitor 80 MG once a day.  *If you need a refill on your cardiac medications before your next appointment, please call your pharmacy*   Lab Work:  CBC and BMP  drawn in office.  Your physician recommends that you return for a FASTING lipid profile: at your earliest [place] will need to be fasting. Please do not have anything to eat or drink after midnight the morning you have the lab work. You may only have water or black coffee with no cream or sugar.  - Please go to the [facility]. You will check in at the front desk to the right as you walk into the atrium. Valet Parking is offered if needed. - No appointment needed. You may go any day between 7 am and 6 pm.   Testing/Procedures:   YAILENE BASSI  08/08/2020  You are scheduled for a Cardiac Catheterization on Wednesday, August 10 with Dr. Kathlyn [place].  1. Please arrive at the [facility] (Main Entrance A) at [facility]: [address] [place], [place] [address] at 10:30 AM (This time is two hours before your procedure to ensure your preparation). Free valet parking service is available.   Special note: Every effort is made to have your procedure done on time. Please understand that emergencies sometimes delay scheduled procedures.  2. Diet: Do not eat solid foods after midnight.  The patient may have clear liquids until 5am upon the day of the procedure.  3. Labs: Drawn In office  4. Medication instructions in preparation for your procedure:   Contrast Allergy: No    Stop taking, HTCZ (Hydrochlorothiazide) Wednesday, August 10,   On the morning of your procedure, take your Aspirin and any morning medicines NOT listed above.  You may use sips of water.  5. Plan for one night stay--bring personal belongings. 6. Bring a current list of  your medications and current insurance cards. 7. You MUST have a responsible person to drive you home. 8. Someone MUST be with you the first 24 hours after you arrive home or your discharge will be delayed. 9. Please wear clothes that are easy to get on and off and wear slip-on shoes.  Thank you for allowing [place] to care for you!   --  Invasive Cardiovascular services    Follow-Up: At [facility], you and your health needs are our priority.  As part of our continuing mission to provide you with exceptional heart care, we have created designated Provider Care Teams.  These Care Teams include your primary Cardiologist (physician) and Advanced Practice Providers (APPs -  Physician Assistants and Nurse Practitioners) who all work together to provide you with the care you need, when you need it.  We recommend signing up for the patient portal called "MyChart".  Sign up information is provided on this After Visit Summary.  MyChart is used to connect with patients for Virtual Visits (Telemedicine).  Patients are able to view lab/test results, encounter notes, upcoming appointments, etc.  Non-urgent messages can be sent to your provider as well.   To learn more about what you can do with MyChart, go to [email or web address].    Your next appointment:    6 weeks  The format for your next appointment:   In Person  Provider:   Kate Sable,  MD   Other Instructions

## 2020-08-08 NOTE — H&P (View-Only) (Signed)
Cardiology Office Note:    Date:  08/08/2020   ID:  Shelley Berg, DOB Nov 17, 1945, MRN [id number]  PCP:  Shelley Sake, MD   [facility] Shelley Berg Cardiologist:  Shelley Sable, MD     Referring MD: Shelley Sake, MD   Chief Complaint  Patient presents with   Follow-up    Follow up to discuss stress test results. Medications verbally reviewed with patient.     History of Present Illness:    Shelley Berg is a 75 y.o. female with a hx of hypertension, hyperlipidemia, moderate aortic valve stenosis who presents for follow-up.  He was last seen due to a cardiac murmur and chest pain.  Murmur due to moderate aortic valve stenosis.  Lexiscan Myoview was performed to evaluate presence of ischemia due to risk factors and symptoms of chest pain.  Now presents for results.  Still has occasional chest discomfort, not bad enough to require nitroglycerin.   Prior notes Echo 05/2020 EF 70 to 75%, moderate aortic stenosis, impaired relaxation  family history of MI in her dad age 44s, 2 brothers had bypass surgeries around age 71s.   Past Medical History:  Diagnosis Date   Aortic stenosis    Hyperlipidemia    Hypertension     History reviewed. No pertinent surgical history.  Current Medications: Current Meds  Medication Sig   aspirin 81 MG chewable tablet Chew 81 mg by mouth daily.   atorvastatin (LIPITOR) 80 MG tablet Take 1 tablet (80 mg total) by mouth daily.   augmented betamethasone dipropionate (DIPROLENE-AF) 0.05 % cream Apply 1 application topically 2 (two) times daily.   folic acid (FOLVITE) 1 MG tablet Take 1 mg by mouth daily.   hydrochlorothiazide (HYDRODIURIL) 25 MG tablet hydrochlorothiazide 25 mg tablet   hydroxychloroquine (PLAQUENIL) 200 MG tablet Take 200 mg by mouth 2 (two) times daily.   lisinopril (ZESTRIL) 20 MG tablet Take 20 mg by mouth daily.   loratadine (CLARITIN) 10 MG tablet Claritin 10 mg tablet  Take 1 tablet every day by oral route.    methotrexate (RHEUMATREX) 2.5 MG tablet Take 4 tablets every week   nitroGLYCERIN (NITROSTAT) 0.4 MG SL tablet Place 1 tablet (0.4 mg total) under the tongue every 5 (five) minutes as needed for chest pain. Up to 3 tabs daily maximum.   omeprazole (PRILOSEC) 20 MG capsule Take 20 mg by mouth daily.   [DISCONTINUED] pravastatin (PRAVACHOL) 80 MG tablet Take 80 mg by mouth daily.     Allergies:   Penicillins   Social History   Socioeconomic History   Marital status: Married    Spouse name: Not on file   Number of children: Not on file   Years of education: Not on file   Highest education level: Not on file  Occupational History   Not on file  Tobacco Use   Smoking status: Never    Passive exposure: Never   Smokeless tobacco: Never  Vaping Use   Vaping Use: Never used  Substance and Sexual Activity   Alcohol use: Never   Drug use: Never   Sexual activity: Not on file  Other Topics Concern   Not on file  Social History Narrative   Not on file   Social Determinants of Health   Financial Resource Strain: Not on file  Food Insecurity: Not on file  Transportation Needs: Not on file  Physical Activity: Not on file  Stress: Not on file  Social Connections: Not on file  Family History: The patient's family history includes Heart attack in her father; Stroke in her father and mother.  ROS:   Please see the history of present illness.     All other systems reviewed and are negative.  EKGs/Labs/Other Studies Reviewed:    The following studies were reviewed today:   EKG:  EKG not  ordered today.    Recent Labs: No results found for requested labs within last 8760 hours.  Recent Lipid Panel No results found for: CHOL, TRIG, HDL, CHOLHDL, VLDL, LDLCALC, LDLDIRECT   Risk Assessment/Calculations:        Physical Exam:    VS:  BP 138/82 (BP Location: Left Arm, Patient Position: Sitting, Cuff Size: Normal)   Pulse 72   Ht '4\' 11"'$  (1.499 m)   Wt 150 lb (68 kg)    SpO2 97%   BMI 30.30 kg/m     Wt Readings from Last 3 Encounters:  08/08/20 150 lb (68 kg)  07/05/20 149 lb (67.6 kg)     GEN:  Well nourished, well developed in no acute distress HEENT: Normal NECK: No JVD; No carotid bruits LYMPHATICS: No lymphadenopathy CARDIAC: RRR, 2/6 systolic murmur RESPIRATORY:  Clear to auscultation without rales, wheezing or rhonchi  ABDOMEN: Soft, non-tender, non-distended MUSCULOSKELETAL:  No edema; No deformity  SKIN: Warm and dry NEUROLOGIC:  Alert and oriented x 3 PSYCHIATRIC:  Normal affect   ASSESSMENT:    1. Precordial pain   2. Aortic valve stenosis, etiology of cardiac valve disease unspecified   3. Primary hypertension   4. Pure hypercholesterolemia     PLAN:    In order of problems listed above:  Chest pain consistent with angina.  History of hypertension, hyperlipidemia, family history of CAD.  Continue aspirin, statin as prescribed.  Echo with normal systolic function, EF XX123456.  Lexiscan Myoview showed inferior lateral wall ischemia.  Three-vessel coronary artery calcification noted on CT attenuated images.  Continue aspirin, stop Pravachol, start Lipitor 80 mg.  Obtain fasting lipid profile.  Schedule left heart cath. Moderate aortic valve stenosis.  Monitor serially with echocardiograms yearly. Hypertension, BP controlled.  Continue lisinopril. Hyperlipidemia, start high intensity statin/Lipitor 80 mg.  Stop Pravachol.  Follow-up after heart cath  Shared Decision Making/Informed Consent The risks [stroke (1 in 1000), death (1 in 1000), kidney failure [usually temporary] (1 in 500), bleeding (1 in 200), allergic reaction [possibly serious] (1 in 200)], benefits (diagnostic support and management of coronary artery disease) and alternatives of a cardiac catheterization were discussed in detail with Shelley Berg and she is willing to proceed.     Medication Adjustments/Labs and Tests Ordered: Current medicines are reviewed at  length with the patient today.  Concerns regarding medicines are outlined above.  Orders Placed This Encounter  Procedures   CBC   Basic metabolic panel   Lipid panel     Meds ordered this encounter  Medications   atorvastatin (LIPITOR) 80 MG tablet    Sig: Take 1 tablet (80 mg total) by mouth daily.    Dispense:  30 tablet    Refill:  5      Patient Instructions  Medication Instructions:   Your physician has recommended you make the following change in your medication:    STOP taking Pravachol.  2.    START taking Lipitor 80 MG once a day.  *If you need a refill on your cardiac medications before your next appointment, please call your pharmacy*   Lab Work:  CBC and BMP  drawn in office.  Your physician recommends that you return for a FASTING lipid profile: at your earliest [place] will need to be fasting. Please do not have anything to eat or drink after midnight the morning you have the lab work. You may only have water or black coffee with no cream or sugar.  - Please go to the [facility]. You will check in at the front desk to the right as you walk into the atrium. Valet Parking is offered if needed. - No appointment needed. You may go any day between 7 am and 6 pm.   Testing/Procedures:   YEWANDE PEYER  08/08/2020  You are scheduled for a Cardiac Catheterization on Wednesday, August 10 with Dr. Kathlyn Sacramento.  1. Please arrive at the [facility] (Main Entrance A) at [facility]: [address] [place], [place] [address] at 10:30 AM (This time is two hours before your procedure to ensure your preparation). Free valet parking service is available.   Special note: Every effort is made to have your procedure done on time. Please understand that emergencies sometimes delay scheduled procedures.  2. Diet: Do not eat solid foods after midnight.  The patient may have clear liquids until 5am upon the day of the procedure.  3. Labs:  Drawn In office  4. Medication instructions in preparation for your procedure:   Contrast Allergy: No    Stop taking, HTCZ (Hydrochlorothiazide) Wednesday, August 10,   On the morning of your procedure, take your Aspirin and any morning medicines NOT listed above.  You may use sips of water.  5. Plan for one night stay--bring personal belongings. 6. Bring a current list of your medications and current insurance cards. 7. You MUST have a responsible person to drive you home. 8. Someone MUST be with you the first 24 hours after you arrive home or your discharge will be delayed. 9. Please wear clothes that are easy to get on and off and wear slip-on shoes.  Thank you for allowing [place] to care for you!   -- [place] [facility]    Follow-Up: At [facility], you and your health needs are our priority.  As part of our continuing mission to provide you with exceptional heart care, we have created designated Provider Care Teams.  These Care Teams include your primary Cardiologist (physician) and Advanced Practice [facility] (APPs -  Physician Assistants and Nurse Practitioners) who all work together to provide you with the care you need, when you need it.  We recommend signing up for the patient portal called "MyChart".  Sign up information is provided on this After Visit Summary.  MyChart is used to connect with patients for Virtual Visits (Telemedicine).  Patients are able to view lab/test results, encounter notes, upcoming appointments, etc.  Non-urgent messages can be sent to your provider as well.   To learn more about what you can do with MyChart, go to [email or web address].    Your next appointment:    6 weeks  The format for your next appointment:   In Person  Provider:   Kate Sable, MD   Other Instructions    Signed, Shelley Sable, MD  08/08/2020 12:21 PM    [place]

## 2020-08-08 NOTE — Progress Notes (Signed)
Cardiology Office Note:    Date:  08/08/2020   ID:  Shelley Berg, DOB [birth year], MRN [id number]  PCP:  Shelley Sake, MD   [facility] Providers Cardiologist:  Shelley Sable, MD     Referring MD: Shelley Sake, MD   Chief Complaint  Patient presents with   Follow-up    Follow up to discuss stress test results. Medications verbally reviewed with patient.     History of Present Illness:    Shelley Berg is a 75 y.o. female with a hx of hypertension, hyperlipidemia, moderate aortic valve stenosis who presents for follow-up.  He was last seen due to a cardiac murmur and chest pain.  Murmur due to moderate aortic valve stenosis.  Lexiscan Myoview was performed to evaluate presence of ischemia due to risk factors and symptoms of chest pain.  Now presents for results.  Still has occasional chest discomfort, not bad enough to require nitroglycerin.   Prior notes Echo 05/2020 EF 70 to 75%, moderate aortic stenosis, impaired relaxation  family history of MI in her dad age 82s, 2 brothers had bypass surgeries around age 44s.   Past Medical History:  Diagnosis Date   Aortic stenosis    Hyperlipidemia    Hypertension     History reviewed. No pertinent surgical history.  Current Medications: Current Meds  Medication Sig   aspirin 81 MG chewable tablet Chew 81 mg by mouth daily.   atorvastatin (LIPITOR) 80 MG tablet Take 1 tablet (80 mg total) by mouth daily.   augmented betamethasone dipropionate (DIPROLENE-AF) 0.05 % cream Apply 1 application topically 2 (two) times daily.   folic acid (FOLVITE) 1 MG tablet Take 1 mg by mouth daily.   hydrochlorothiazide (HYDRODIURIL) 25 MG tablet hydrochlorothiazide 25 mg tablet   hydroxychloroquine (PLAQUENIL) 200 MG tablet Take 200 mg by mouth 2 (two) times daily.   lisinopril (ZESTRIL) 20 MG tablet Take 20 mg by mouth daily.   loratadine (CLARITIN) 10 MG tablet Claritin 10 mg tablet  Take 1 tablet every day by oral route.    methotrexate (RHEUMATREX) 2.5 MG tablet Take 4 tablets every week   nitroGLYCERIN (NITROSTAT) 0.4 MG SL tablet Place 1 tablet (0.4 mg total) under the tongue every 5 (five) minutes as needed for chest pain. Up to 3 tabs daily maximum.   omeprazole (PRILOSEC) 20 MG capsule Take 20 mg by mouth daily.   [DISCONTINUED] pravastatin (PRAVACHOL) 80 MG tablet Take 80 mg by mouth daily.     Allergies:   Penicillins   Social History   Socioeconomic History   Marital status: Married    Spouse name: Not on file   Number of children: Not on file   Years of education: Not on file   Highest education level: Not on file  Occupational History   Not on file  Tobacco Use   Smoking status: Never    Passive exposure: Never   Smokeless tobacco: Never  Vaping Use   Vaping Use: Never used  Substance and Sexual Activity   Alcohol use: Never   Drug use: Never   Sexual activity: Not on file  Other Topics Concern   Not on file  Social History Narrative   Not on file   Social Determinants of Health   Financial Resource Strain: Not on file  Food Insecurity: Not on file  Transportation Needs: Not on file  Physical Activity: Not on file  Stress: Not on file  Social Connections: Not on file  Family History: The patient's family history includes Heart attack in her father; Stroke in her father and mother.  ROS:   Please see the history of present illness.     All other systems reviewed and are negative.  EKGs/Labs/Other Studies Reviewed:    The following studies were reviewed today:   EKG:  EKG not  ordered today.    Recent Labs: No results found for requested labs within last 8760 hours.  Recent Lipid Panel No results found for: CHOL, TRIG, HDL, CHOLHDL, VLDL, LDLCALC, LDLDIRECT   Risk Assessment/Calculations:        Physical Exam:    VS:  BP 138/82 (BP Location: Left Arm, Patient Position: Sitting, Cuff Size: Normal)   Pulse 72   Ht '4\' 11"'$  (1.499 m)   Wt 150 lb (68 kg)    SpO2 97%   BMI 30.30 kg/m     Wt Readings from Last 3 Encounters:  08/08/20 150 lb (68 kg)  07/05/20 149 lb (67.6 kg)     GEN:  Well nourished, well developed in no acute distress HEENT: Normal NECK: No JVD; No carotid bruits LYMPHATICS: No lymphadenopathy CARDIAC: RRR, 2/6 systolic murmur RESPIRATORY:  Clear to auscultation without rales, wheezing or rhonchi  ABDOMEN: Soft, non-tender, non-distended MUSCULOSKELETAL:  No edema; No deformity  SKIN: Warm and dry NEUROLOGIC:  Alert and oriented x 3 PSYCHIATRIC:  Normal affect   ASSESSMENT:    1. Precordial pain   2. Aortic valve stenosis, etiology of cardiac valve disease unspecified   3. Primary hypertension   4. Pure hypercholesterolemia     PLAN:    In order of problems listed above:  Chest pain consistent with angina.  History of hypertension, hyperlipidemia, family history of CAD.  Continue aspirin, statin as prescribed.  Echo with normal systolic function, EF [id number].  Lexiscan Myoview showed inferior lateral wall ischemia.  Three-vessel coronary artery calcification noted on CT attenuated images.  Continue aspirin, stop Pravachol, start Lipitor 80 mg.  Obtain fasting lipid profile.  Schedule left heart cath. Moderate aortic valve stenosis.  Monitor serially with echocardiograms yearly. Hypertension, BP controlled.  Continue lisinopril. Hyperlipidemia, start high intensity statin/Lipitor 80 mg.  Stop Pravachol.  Follow-up after heart cath  Shared Decision Making/Informed Consent The risks [stroke (1 in 1000), death (1 in 1000), kidney failure [usually temporary] (1 in 500), bleeding (1 in 200), allergic reaction [possibly serious] (1 in 200)], benefits (diagnostic support and management of coronary artery disease) and alternatives of a cardiac catheterization were discussed in detail with Shelley Berg and she is willing to proceed.     Medication Adjustments/Labs and Tests Ordered: Current medicines are reviewed at  length with the patient today.  Concerns regarding medicines are outlined above.  Orders Placed This Encounter  Procedures   CBC   Basic metabolic panel   Lipid panel     Meds ordered this encounter  Medications   atorvastatin (LIPITOR) 80 MG tablet    Sig: Take 1 tablet (80 mg total) by mouth daily.    Dispense:  30 tablet    Refill:  5      Patient Instructions  Medication Instructions:   Your physician has recommended you make the following change in your medication:    STOP taking Pravachol.  2.    START taking Lipitor 80 MG once a day.  *If you need a refill on your cardiac medications before your next appointment, please call your pharmacy*   Lab Work:  CBC and BMP  drawn in office.  Your physician recommends that you return for a FASTING lipid profile: at your earliest [place] will need to be fasting. Please do not have anything to eat or drink after midnight the morning you have the lab work. You may only have water or black coffee with no cream or sugar.  - Please go to the [facility]. You will check in at the front desk to the right as you walk into the atrium. Valet Parking is offered if needed. - No appointment needed. You may go any day between 7 am and 6 pm.   Testing/Procedures:   Shelley Berg  08/08/2020  You are scheduled for a Cardiac Catheterization on Wednesday, August 10 with Dr. Kathlyn Sacramento.  1. Please arrive at the [facility] (Main Entrance A) at [facility]: [address] [place], [place] [address] at 10:30 AM (This time is two hours before your procedure to ensure your preparation). Free valet parking service is available.   Special note: Every effort is made to have your procedure done on time. Please understand that emergencies sometimes delay scheduled procedures.  2. Diet: Do not eat solid foods after midnight.  The patient may have clear liquids until 5am upon the day of the procedure.  3. Labs:  Drawn In office  4. Medication instructions in preparation for your procedure:   Contrast Allergy: No    Stop taking, HTCZ (Hydrochlorothiazide) Wednesday, August 10,   On the morning of your procedure, take your Aspirin and any morning medicines NOT listed above.  You may use sips of water.  5. Plan for one night stay--bring personal belongings. 6. Bring a current list of your medications and current insurance cards. 7. You MUST have a responsible person to drive you home. 8. Someone MUST be with you the first 24 hours after you arrive home or your discharge will be delayed. 9. Please wear clothes that are easy to get on and off and wear slip-on shoes.  Thank you for allowing [place] to care for you!   -- [place] [facility]    Follow-Up: At [facility], you and your health needs are our priority.  As part of our continuing mission to provide you with exceptional heart care, we have created designated Provider Care Teams.  These Care Teams include your primary Cardiologist (physician) and Advanced Practice Providers (APPs -  Physician Assistants and Nurse Practitioners) who all work together to provide you with the care you need, when you need it.  We recommend signing up for the patient portal called "MyChart".  Sign up information is provided on this After Visit Summary.  MyChart is used to connect with patients for Virtual Visits (Telemedicine).  Patients are able to view lab/test results, encounter notes, upcoming appointments, etc.  Non-urgent messages can be sent to your provider as well.   To learn more about what you can do with MyChart, go to [email or web address].    Your next appointment:    6 weeks  The format for your next appointment:   In Person  Provider:   Kate Sable, MD   Other Instructions    Signed, Shelley Sable, MD  08/08/2020 12:21 PM    [place]

## 2020-08-09 ENCOUNTER — Telehealth: Payer: Self-pay | Admitting: *Deleted

## 2020-08-09 LAB — BASIC METABOLIC PANEL
BUN/Creatinine Ratio: 15 (ref 12–28)
BUN: 10 mg/dL (ref 8–27)
CO2: 20 mmol/L (ref 20–29)
Calcium: 9.9 mg/dL (ref 8.7–10.3)
Chloride: 104 mmol/L (ref 96–106)
Creatinine, Ser: 0.65 mg/dL (ref 0.57–1.00)
Glucose: 104 mg/dL — ABNORMAL HIGH (ref 65–99)
Potassium: 4.7 mmol/L (ref 3.5–5.2)
Sodium: 142 mmol/L (ref 134–144)
eGFR: 93 mL/min/{1.73_m2} (ref >59)

## 2020-08-09 LAB — CBC
Hematocrit: 42.5 % (ref 34.0–46.6)
Hemoglobin: 14.2 g/dL (ref 11.1–15.9)
MCH: 30.7 pg (ref 26.6–33.0)
MCHC: 33.4 g/dL (ref 31.5–35.7)
MCV: 92 fL (ref 79–97)
Platelets: 334 10*3/uL (ref 150–450)
RBC: 4.62 x10E6/uL (ref 3.77–5.28)
RDW: 13.3 % (ref 11.7–15.4)
WBC: 7.2 10*3/uL (ref 3.4–10.8)

## 2020-08-09 NOTE — Telephone Encounter (Signed)
Cardiac catheterization scheduled at [facility] for: Wednesday August 10, 2020 12:30 PM [facility] Main Entrance A [facility]) at: 10:30 AM   No solid food after midnight prior to cath, clear liquids until 5 AM day of procedure.  Usual morning medications can be taken pre-cath with sips of water including aspirin 81 mg.    Confirmed patient has responsible adult to drive home post procedure and be with patient first 24 hours after arriving home.  Patients are allowed one visitor in the waiting room during the time they are at the hospital for their procedure. Both patient and visitor must wear a mask once they enter the hospital.   Patient reports does not currently have any symptoms concerning for COVID-19 and no household members with COVID-19 like illness.   Reviewed procedure/mask/visitor instructions with patient.

## 2020-08-10 ENCOUNTER — Ambulatory Visit (HOSPITAL_COMMUNITY)
Admission: RE | Admit: 2020-08-10 | Discharge: 2020-08-10 | Disposition: A | Discharge status: Home / Self Care | Payer: Medicare Other | Attending: Cardiovascular Disease | Admitting: Cardiovascular Disease

## 2020-08-10 ENCOUNTER — Telehealth: Payer: Self-pay

## 2020-08-10 ENCOUNTER — Other Ambulatory Visit: Payer: Self-pay

## 2020-08-10 ENCOUNTER — Encounter (HOSPITAL_COMMUNITY)
Admission: RE | Disposition: A | Discharge status: Home / Self Care | Payer: Self-pay | Source: Home / Self Care | Attending: Cardiovascular Disease

## 2020-08-10 DIAGNOSIS — I25118 Atherosclerotic heart disease of native coronary artery with other forms of angina pectoris: Secondary | ICD-10-CM

## 2020-08-10 DIAGNOSIS — Z8249 Family history of ischemic heart disease and other diseases of the circulatory system: Secondary | ICD-10-CM | POA: Diagnosis not present

## 2020-08-10 DIAGNOSIS — Z7982 Long term (current) use of aspirin: Secondary | ICD-10-CM | POA: Diagnosis not present

## 2020-08-10 DIAGNOSIS — Z79899 Other long term (current) drug therapy: Secondary | ICD-10-CM | POA: Insufficient documentation

## 2020-08-10 DIAGNOSIS — Z006 Encounter for examination for normal comparison and control in clinical research program: Secondary | ICD-10-CM

## 2020-08-10 DIAGNOSIS — Z880 Allergy status to penicillin: Secondary | ICD-10-CM | POA: Diagnosis not present

## 2020-08-10 DIAGNOSIS — E7800 Pure hypercholesterolemia, unspecified: Secondary | ICD-10-CM | POA: Diagnosis not present

## 2020-08-10 DIAGNOSIS — I2510 Atherosclerotic heart disease of native coronary artery without angina pectoris: Secondary | ICD-10-CM

## 2020-08-10 DIAGNOSIS — I25119 Atherosclerotic heart disease of native coronary artery with unspecified angina pectoris: Secondary | ICD-10-CM | POA: Insufficient documentation

## 2020-08-10 DIAGNOSIS — I10 Essential (primary) hypertension: Secondary | ICD-10-CM | POA: Insufficient documentation

## 2020-08-10 DIAGNOSIS — I2582 Chronic total occlusion of coronary artery: Secondary | ICD-10-CM | POA: Diagnosis not present

## 2020-08-10 DIAGNOSIS — R072 Precordial pain: Secondary | ICD-10-CM

## 2020-08-10 DIAGNOSIS — I350 Nonrheumatic aortic (valve) stenosis: Secondary | ICD-10-CM

## 2020-08-10 DIAGNOSIS — I272 Pulmonary hypertension, unspecified: Secondary | ICD-10-CM | POA: Insufficient documentation

## 2020-08-10 HISTORY — PX: RIGHT/LEFT HEART CATH AND CORONARY ANGIOGRAPHY: CATH118266

## 2020-08-10 LAB — POCT I-STAT EG7
Acid-base deficit: 1 mmol/L (ref 0.0–2.0)
Bicarbonate: 25.1 mmol/L (ref 20.0–28.0)
Calcium, Ion: 1.28 mmol/L (ref 1.15–1.40)
HCT: 37 % (ref 36.0–46.0)
Hemoglobin: 12.6 g/dL (ref 12.0–15.0)
O2 Saturation: 76 %
Potassium: 3.9 mmol/L (ref 3.5–5.1)
Sodium: 142 mmol/L (ref 135–145)
TCO2: 26 mmol/L (ref 22–32)
pCO2, Ven: 45.9 mmHg (ref 44.0–60.0)
pH, Ven: 7.346 (ref 7.250–7.430)
pO2, Ven: 44 mmHg (ref 32.0–45.0)

## 2020-08-10 LAB — POCT I-STAT 7, (LYTES, BLD GAS, ICA,H+H)
Acid-Base Excess: 1 mmol/L (ref 0.0–2.0)
Bicarbonate: 26 mmol/L (ref 20.0–28.0)
Calcium, Ion: 1.26 mmol/L (ref 1.15–1.40)
HCT: 37 % (ref 36.0–46.0)
Hemoglobin: 12.6 g/dL (ref 12.0–15.0)
O2 Saturation: 100 %
Potassium: 3.9 mmol/L (ref 3.5–5.1)
Sodium: 142 mmol/L (ref 135–145)
TCO2: 27 mmol/L (ref 22–32)
pCO2 arterial: 43.4 mmHg (ref 32.0–48.0)
pH, Arterial: 7.386 (ref 7.350–7.450)
pO2, Arterial: 197 mmHg — ABNORMAL HIGH (ref 83.0–108.0)

## 2020-08-10 SURGERY — RIGHT/LEFT HEART CATH AND CORONARY ANGIOGRAPHY
Anesthesia: LOCAL

## 2020-08-10 MED ORDER — SODIUM CHLORIDE 0.9% FLUSH: 3.0000 mL | Freq: Two times a day (BID) | INTRAVENOUS | Status: DC

## 2020-08-10 MED ORDER — FENTANYL CITRATE (PF) 100 MCG/2ML IJ SOLN
INTRAMUSCULAR | Status: DC | PRN
  Administered 2020-08-10: 25 ug via INTRAVENOUS

## 2020-08-10 MED ORDER — SODIUM CHLORIDE 0.9 % WEIGHT BASED INFUSION
3.0000 mL/kg/h | INTRAVENOUS | Status: AC
  Administered 2020-08-10: 3 mL/kg/h via INTRAVENOUS

## 2020-08-10 MED ORDER — SODIUM CHLORIDE 0.9 % IV SOLN: INTRAVENOUS | Status: DC

## 2020-08-10 MED ORDER — HEPARIN SODIUM (PORCINE) 1000 UNIT/ML IJ SOLN
INTRAMUSCULAR | Status: AC
  Filled 2020-08-10: qty 1

## 2020-08-10 MED ORDER — LIDOCAINE HCL (PF) 1 % IJ SOLN
INTRAMUSCULAR | Status: DC | PRN
  Administered 2020-08-10 (×2): 3 mL

## 2020-08-10 MED ORDER — SODIUM CHLORIDE 0.9 % WEIGHT BASED INFUSION: 1.0000 mL/kg/h | INTRAVENOUS | Status: DC

## 2020-08-10 MED ORDER — VERAPAMIL HCL 2.5 MG/ML IV SOLN
INTRAVENOUS | Status: DC | PRN
  Administered 2020-08-10: 10 mL via INTRA_ARTERIAL

## 2020-08-10 MED ORDER — SODIUM CHLORIDE 0.9 % IV SOLN: 250.0000 mL | INTRAVENOUS | Status: DC | PRN

## 2020-08-10 MED ORDER — HEPARIN (PORCINE) IN NACL 1000-0.9 UT/500ML-% IV SOLN
INTRAVENOUS | Status: DC | PRN
  Administered 2020-08-10: 500 mL

## 2020-08-10 MED ORDER — HEPARIN (PORCINE) IN NACL 1000-0.9 UT/500ML-% IV SOLN
INTRAVENOUS | Status: DC | PRN
Start: 2020-08-10 — End: 2020-08-10
  Administered 2020-08-10: 500 mL

## 2020-08-10 MED ORDER — MIDAZOLAM HCL 2 MG/2ML IJ SOLN
INTRAMUSCULAR | Status: DC | PRN
  Administered 2020-08-10: 1 mg via INTRAVENOUS

## 2020-08-10 MED ORDER — ACETAMINOPHEN 325 MG PO TABS: 650.0000 mg | ORAL_TABLET | ORAL | Status: DC | PRN

## 2020-08-10 MED ORDER — MIDAZOLAM HCL 2 MG/2ML IJ SOLN
INTRAMUSCULAR | Status: AC
  Filled 2020-08-10: qty 2

## 2020-08-10 MED ORDER — FENTANYL CITRATE (PF) 100 MCG/2ML IJ SOLN
INTRAMUSCULAR | Status: AC
  Filled 2020-08-10: qty 2

## 2020-08-10 MED ORDER — SODIUM CHLORIDE 0.9% FLUSH: 3.0000 mL | INTRAVENOUS | Status: DC | PRN

## 2020-08-10 MED ORDER — NITROGLYCERIN 0.4 MG SL SUBL: 0.4000 mg | SUBLINGUAL_TABLET | SUBLINGUAL | Status: DC | PRN

## 2020-08-10 MED ORDER — VERAPAMIL HCL 2.5 MG/ML IV SOLN
INTRAVENOUS | Status: AC
  Filled 2020-08-10: qty 2

## 2020-08-10 MED ORDER — HEPARIN SODIUM (PORCINE) 1000 UNIT/ML IJ SOLN
INTRAMUSCULAR | Status: DC | PRN
  Administered 2020-08-10: 3000 [IU] via INTRAVENOUS

## 2020-08-10 MED ORDER — LIDOCAINE HCL (PF) 1 % IJ SOLN
INTRAMUSCULAR | Status: AC
  Filled 2020-08-10: qty 30

## 2020-08-10 MED ORDER — ONDANSETRON HCL 4 MG/2ML IJ SOLN: 4.0000 mg | Freq: Four times a day (QID) | INTRAMUSCULAR | Status: DC | PRN

## 2020-08-10 MED ORDER — IOHEXOL 350 MG/ML SOLN
INTRAVENOUS | Status: DC | PRN
  Administered 2020-08-10: 60 mL

## 2020-08-10 MED ORDER — HEPARIN (PORCINE) IN NACL 1000-0.9 UT/500ML-% IV SOLN
INTRAVENOUS | Status: AC
  Filled 2020-08-10: qty 1000

## 2020-08-10 SURGICAL SUPPLY — 14 items
CATH BALLN WEDGE 5F 110CM (CATHETERS) ×2 IMPLANT
CATH INFINITI 5FR JK (CATHETERS) ×2 IMPLANT
CATH INFINITI JR4 5F (CATHETERS) ×2 IMPLANT
DEVICE RAD TR BAND REGULAR (VASCULAR PRODUCTS) ×2 IMPLANT
GLIDESHEATH SLEND SS 6F .021 (SHEATH) ×2 IMPLANT
GUIDEWIRE INQWIRE 1.5J.035X260 (WIRE) ×1 IMPLANT
INQWIRE 1.5J .035X260CM (WIRE) ×2
KIT HEART LEFT (KITS) ×2 IMPLANT
PACK CARDIAC CATHETERIZATION (CUSTOM PROCEDURE TRAY) ×2 IMPLANT
SHEATH GLIDE SLENDER 4/5FR (SHEATH) ×2 IMPLANT
SHEATH PROBE COVER 6X72 (BAG) ×2 IMPLANT
TRANSDUCER W/STOPCOCK (MISCELLANEOUS) ×2 IMPLANT
TUBING CIL FLEX 10 FLL-RA (TUBING) ×2 IMPLANT
WIRE HI TORQ VERSACORE-J 145CM (WIRE) ×2 IMPLANT

## 2020-08-10 NOTE — Discharge Instructions (Signed)
Radial Site Care  This sheet gives you information about how to care for yourself after your procedure. Your health care provider may also give you more specific instructions. If you have problems or questions, contact your health care provider. What can I expect after the procedure? After the procedure, it is common to have: Bruising and tenderness at the catheter insertion area. Follow these instructions at home: Medicines Take over-the-counter and prescription medicines only as told by your health care provider. Insertion site care Follow instructions from your health care provider about how to take care of your insertion site. Make sure you: Wash your hands with soap and water before you remove your bandage (dressing). If soap and water are not available, use hand sanitizer. May remove dressing in 24 hours. Check your insertion site every day for signs of infection. Check for: Redness, swelling, or pain. Fluid or blood. Pus or a bad smell. Warmth. Do no take baths, swim, or use a hot tub for 5 days. You may shower 24-48 hours after the procedure. Remove the dressing and gently wash the site with plain soap and water. Pat the area dry with a clean towel. Do not rub the site. That could cause bleeding. Do not apply powder or lotion to the site. Activity  For 24 hours after the procedure, or as directed by your health care provider: Do not flex or bend the affected arm. Do not push or pull heavy objects with the affected arm. Do not drive yourself home from the hospital or clinic. You may drive 24 hours after the procedure. Do not operate machinery or power tools. KEEP ARM ELEVATED THE REMAINDER OF THE DAY. Do not push, pull or lift anything that is heavier than 10 lb for 5 days. Ask your health care provider when it is okay to: Return to work or school. Resume usual physical activities or sports. Resume sexual activity. General instructions If the catheter site starts to  bleed, raise your arm and put firm pressure on the site. If the bleeding does not stop, get help right away. This is a medical emergency. DRINK PLENTY OF FLUIDS FOR THE NEXT 2-3 DAYS. No alcohol consumption for 24 hours after receiving sedation. If you went home on the same day as your procedure, a responsible adult should be with you for the first 24 hours after you arrive home. Keep all follow-up visits as told by your health care provider. This is important. Contact a health care provider if: You have a fever. You have redness, swelling, or yellow drainage around your insertion site. Get help right away if: You have unusual pain at the radial site. The catheter insertion area swells very fast. The insertion area is bleeding, and the bleeding does not stop when you hold steady pressure on the area. Your arm or hand becomes pale, cool, tingly, or numb. These symptoms may represent a serious problem that is an emergency. Do not wait to see if the symptoms will go away. Get medical help right away. Call your local emergency services (911 in the [place] Do not drive yourself to the hospital. Summary After the procedure, it is common to have bruising and tenderness at the site. Follow instructions from your health care provider about how to take care of your radial site wound. Check the wound every day for signs of infection.  This information is not intended to replace advice given to you by your health care provider. Make sure you discuss any questions you have with   your health care provider. Document Revised: 01/23/2017 Document Reviewed: 01/23/2017 Elsevier Patient Education  2020 [employer]  

## 2020-08-10 NOTE — Progress Notes (Addendum)
Upon arrival to short stay, pt's LAC iv site was infiltrated. IV removed, pressure held, wrapped in coban and heat applied to site.   Prior to DC, pt drank 720cc of PO liquids for hydration since pt did not have IV access.

## 2020-08-10 NOTE — Telephone Encounter (Addendum)
IDENTIFY Informed Consent                  Error

## 2020-08-10 NOTE — Interval H&P Note (Signed)
Cath Lab Visit (complete for each Cath Lab visit)  Clinical Evaluation Leading to the Procedure:   ACS: No.  Non-ACS:    Anginal Classification: CCS III  Anti-ischemic medical therapy: Minimal Therapy (1 class of medications)  Non-Invasive Test Results: High-risk stress test findings: cardiac mortality >3%/year  Prior CABG: No previous CABG      History and Physical Interval Note:  08/10/2020 12:37 PM  Shelley Berg  has presented today for surgery, with the diagnosis of cad.  The various methods of treatment have been discussed with the patient and family. After consideration of risks, benefits and other options for treatment, the patient has consented to  Procedure(s): LEFT HEART CATH AND CORONARY ANGIOGRAPHY (N/A) as a surgical intervention.  The patient's history has been reviewed, patient examined, no change in status, stable for surgery.  I have reviewed the patient's chart and labs.  Questions were answered to the patient's satisfaction.     Kathlyn [place]

## 2020-08-10 NOTE — Progress Notes (Signed)
Pt ambulated without difficulty or bleeding.   Discharged home with her daughter who will drive and stay with pt x 24 hrs. 

## 2020-08-11 ENCOUNTER — Encounter (HOSPITAL_COMMUNITY): Payer: Self-pay | Admitting: Cardiovascular Disease

## 2020-08-15 NOTE — Research (Signed)
IDENTIFY Informed Consent                  Subject Name: Shelley Berg    Subject met inclusion and exclusion criteria.  The informed consent form, study requirements and expectations were reviewed with the subject and questions and concerns were addressed prior to the signing of the consent form.  The subject verbalized understanding of the trial requirements.  The subject agreed to participate in the IDENTIFY trial and signed the informed consent at 10:53AM on 08/10/2020.  The informed consent was obtained prior to performance of any protocol-specific procedures for the subject.  A copy of the signed informed consent was given to the subject and a copy was placed in the subject's medical record.    Ledon Snare, Research Assistant

## 2020-08-16 ENCOUNTER — Other Ambulatory Visit
Admission: RE | Admit: 2020-08-16 | Discharge: 2020-08-16 | Disposition: A | Discharge status: Home / Self Care | Payer: Medicare Other | Attending: Cardiology | Admitting: Cardiology

## 2020-08-16 DIAGNOSIS — E7800 Pure hypercholesterolemia, unspecified: Secondary | ICD-10-CM | POA: Insufficient documentation

## 2020-08-16 LAB — LIPID PANEL
Cholesterol: 163 mg/dL (ref 0–200)
HDL: 60 mg/dL (ref >40)
LDL Cholesterol: 77 mg/dL (ref 0–99)
Total CHOL/HDL Ratio: 2.7 RATIO
Triglycerides: 128 mg/dL (ref <150)
VLDL: 26 mg/dL (ref 0–40)

## 2020-08-18 ENCOUNTER — Encounter: Payer: Self-pay | Admitting: Cardiovascular Disease

## 2020-08-18 ENCOUNTER — Ambulatory Visit: Payer: Medicare Other | Admitting: Cardiology

## 2020-08-18 ENCOUNTER — Ambulatory Visit: Payer: Medicare Other | Admitting: Cardiovascular Disease

## 2020-08-18 ENCOUNTER — Other Ambulatory Visit: Payer: Self-pay

## 2020-08-18 VITALS — BP 136/72 | HR 78 | Ht 59.0 in | Wt 150.2 lb

## 2020-08-18 DIAGNOSIS — I350 Nonrheumatic aortic (valve) stenosis: Secondary | ICD-10-CM

## 2020-08-18 NOTE — Progress Notes (Signed)
[facility]   MULTIDISCIPLINARY HEART VALVE TEAM  Date:  08/19/2020   ID:  Shelley Berg, DOB [birth year], MRN [id number]  PCP:  Leonides Sake, MD   Chief Complaint  Patient presents with   Shortness of Breath   Chest Pain      HISTORY OF PRESENT ILLNESS: Shelley Berg is a 75 y.o. female who presents for evaluation of aortic stenosis and coronary artery disease, referred by Dr Garen Lah.  The patient has been followed by Dr Garen Lah.  She has known to have moderate aortic stenosis, hypertension, and mixed hyperlipidemia.  She recently underwent a Lexiscan Myoview stress test that demonstrated inferolateral ischemia.  She was also noted to have three-vessel coronary artery calcification on CT imaging.  She was referred for cardiac catheterization demonstrating severe stenosis of the mid circumflex, moderate LAD stenosis, and total occlusion of the RCA.  She is here with her sister today. She was first told of a heart murmur earlier this year by a home health nurse. She then was referred to Dr Garen Lah for further evaluation outlined above. There is no hx of valvular heart disease or aneurysm in the family. She has 2 brothers who had bypass surgeries in their 31's and 24's.   The patient has a tightness in her chest and shortness of breath associated with physical activity. She has had these symptoms with climbing one flight of stairs. Symptoms are relieved with rest. She would experience symptoms with one block of walking.   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 She is a lifelong non-smoker. She is married and reports that her husband is not in very good health. They have 2 children and 4 grandchildren.   The patient has had regular dental care and reports no problems.   Past Medical History:  Diagnosis Date   Aortic stenosis    Hyperlipidemia    Hypertension     Current Outpatient Medications  Medication Sig Dispense  Refill   aspirin EC 81 MG tablet Take 81 mg by mouth in the morning. Swallow whole.     augmented betamethasone dipropionate (DIPROLENE-AF) 0.05 % cream Apply 1 application topically 2 (two) times daily as needed (rash on legs).     Calcium Carb-Cholecalciferol (CALCIUM 600+D3 PO) Take 1 tablet by mouth in the morning.     Cholecalciferol (VITAMIN D3) 125 MCG (5000 UT) TABS Take 5,000 Units by mouth.     folic acid (FOLVITE) 1 MG tablet Take 1 mg by mouth in the morning.     hydroxychloroquine (PLAQUENIL) 200 MG tablet Take 200 mg by mouth in the morning.     lisinopril (ZESTRIL) 20 MG tablet Take 20 mg by mouth in the morning.     loratadine (CLARITIN) 10 MG tablet Take 10 mg by mouth in the morning.     methotrexate (RHEUMATREX) 2.5 MG tablet Take 10 mg by mouth every Monday.     nitroGLYCERIN (NITROSTAT) 0.4 MG SL tablet Place 1 tablet (0.4 mg total) under the tongue every 5 (five) minutes x 3 doses as needed for chest pain. Up to 3 tabs daily maximum.     Omega-3 1000 MG CAPS Take 1,000 mg by mouth in the morning.     omeprazole (PRILOSEC) 20 MG capsule Take 20 mg by mouth in the morning.     atorvastatin (LIPITOR) 80 MG tablet Take 1 tablet (80 mg total) by mouth daily. 90 tablet 1   pravastatin (PRAVACHOL)  80 MG tablet Take 80 mg by mouth every evening.     No current facility-administered medications for this visit.    ALLERGIES:   Penicillins   SOCIAL HISTORY:  The patient  reports that she has never smoked. She has never been exposed to tobacco smoke. She has never used smokeless tobacco. She reports that she does not drink alcohol and does not use drugs.   FAMILY HISTORY:  The patient's family history includes Heart attack in her father; Stroke in her father and mother.   REVIEW OF SYSTEMS:  Positive for fatigue.   All other systems are reviewed and negative.   PHYSICAL EXAM: VS:  BP 136/72   Pulse 78   Ht '4\' 11"'$  (1.499 m)   Wt 150 lb 3.2 oz (68.1 kg)   SpO2 96%   BMI 30.34  kg/m  , BMI Body mass index is 30.34 kg/m. GEN: Well nourished, well developed, in no acute distress HEENT: normal Neck: No JVD. carotids 2+ without bruits or masses Cardiac: The heart is RRR with a 3/6 late peaking harsh crescendo decrescendo murmur at the right upper sternal border with diminished A2.  Trace bilateral pretibial edema. Pedal pulses 2+ = bilaterally  Respiratory:  clear to auscultation bilaterally GI: soft, nontender, nondistended, + BS MS: no deformity or atrophy Skin: warm and dry, no rash Neuro:  Strength and sensation are intact Psych: euthymic mood, full affect  EKG:  EKG from 08/10/2020 reviewed and demonstrates normal sinus rhythm, age-indeterminate inferior infarct, heart rate 61 bpm.  RECENT LABS: 08/08/2020: BUN 10; Creatinine, Ser 0.65; Platelets 334 08/10/2020: Hemoglobin 12.6; Potassium 3.9; Sodium 142  08/16/2020: Cholesterol 163; HDL 60; LDL Cholesterol 77; Total CHOL/HDL Ratio 2.7; Triglycerides 128; VLDL 26   Estimated Creatinine Clearance: 52.6 mL/min (by C-G formula based on SCr of 0.65 mg/dL).   Wt Readings from Last 3 Encounters:  08/18/20 150 lb 3.2 oz (68.1 kg)  08/10/20 150 lb (68 kg)  08/08/20 150 lb (68 kg)     CARDIAC STUDIES: Echo:  IMPRESSIONS     1. Left ventricular ejection fraction, by estimation, is 70 to 75%. The  left ventricle has hyperdynamic function. Left ventricular diastolic  parameters are consistent with Grade I diastolic dysfunction (impaired  relaxation). Elevated left atrial  pressure.   2. Right ventricular systolic function is normal. The right ventricular  size is normal. There is normal pulmonary artery systolic pressure.   3. Left atrial size was mildly dilated.   4. Mild mitral valve regurgitation.   5. AV is difficult to see. It is thickened, calcified. Peak and mean  gradients through the valve are 47 and 28 mm Hg respectively Dimensionless  index is 0.3 consistent with moderate severe \ AS. Marland Kitchen The aortic  valve was  not well visualized. Aortic valve  regurgitation is not visualized.   6. The inferior vena cava is normal in size with greater than 50%  respiratory variability, suggesting right atrial pressure of 3 mmHg.   7. Aortic dilatation noted. There is mild dilatation of the ascending  aorta, measuring 42 mm.   Conclusion(s)/Recommendation(s): Would recommend follow up echo in 6  months to reevaluate aortic valve.  LEFT VENTRICLE  PLAX 2D  LVIDd:         3.50 cm  Diastology  LVIDs:         2.10 cm  LV e' medial:    4.22 cm/s  LV PW:         1.00 cm  LV E/e' medial:  21.4  LV IVS:        0.90 cm  LV e' lateral:   3.24 cm/s  LVOT diam:     1.80 cm  LV E/e' lateral: 27.8  LV SV:         54  LVOT Area:     2.54 cm      RIGHT VENTRICLE  RV S prime:     8.70 cm/s  TAPSE (M-mode): 1.2 cm   LEFT ATRIUM             RIGHT ATRIUM  LA diam:        3.50 cm RA Area:     7.01 cm  LA Vol (A2C):   20.3 ml RA Volume:   10.30 ml  LA Vol (A4C):   38.6 ml  LA Biplane Vol: 28.8 ml   AORTIC VALVE  AV Area (Vmax):    0.69 cm  AV Area (Vmean):   0.63 cm  AV Area (VTI):     0.70 cm  AV Vmax:           327.80 cm/s  AV Vmean:          248.600 cm/s  AV VTI:            0.770 m  AV Peak Grad:      43.0 mmHg  AV Mean Grad:      27.6 mmHg  LVOT Vmax:         89.40 cm/s  LVOT Vmean:        62.000 cm/s  LVOT VTI:          0.211 m  LVOT/AV VTI ratio: 0.27     AORTA  Ao Root diam: 3.20 cm  Ao Asc diam:  4.20 cm   MITRAL VALVE                TRICUSPID VALVE  MV Area (PHT): 3.27 cm     TR Peak grad:   13.1 mmHg  MV Decel Time: 232 msec     TR Vmax:        181.00 cm/s  MV E velocity: 90.20 cm/s  MV A velocity: 112.00 cm/s  SHUNTS  MV E/A ratio:  0.81         Systemic VTI:  0.21 m                              Systemic Diam: 1.80 cm   Cardiac Cath:  Conclusion      Prox LAD lesion is 60% stenosed.   Mid LAD lesion is 40% stenosed.   Ost Cx to Prox Cx lesion is 30% stenosed.   Mid Cx  lesion is 85% stenosed.   Mid RCA lesion is 100% stenosed.   1.  Significant calcified three-vessel coronary artery disease.  Chronically occluded mid right coronary artery with collaterals.  LAD disease is borderline. 2.  Left ventricular angiography was not performed.  EF was normal by echo. Right heart catheterization was performed due to presence of aortic stenosis.  It showed normal right and left-sided filling pressures, mild pulmonary hypertension and normal cardiac output. 3.  Moderate to severe aortic stenosis with peak to peak gradient of 42 mmHg and valve area of 1.00 cm.   Recommendations: Given the presence of calcified three-vessel coronary artery disease and moderate to severe aortic stenosis, we have to discuss management options with the heart team  including CABG plus AVR versus PCI plus TAVR.  The LAD disease is not critical and RCA is already collateralized.  Options for PCI include treatment of the left circumflex and leaving everything else to be treated medically with plans for TAVR in the near future.  I will discuss with colleagues.  Diagnostic Dominance: Right Left Main  Vessel is angiographically normal.  Left Anterior Descending  Prox LAD lesion is 60% stenosed. The lesion is severely calcified.  Mid LAD lesion is 40% stenosed. The lesion is moderately calcified.  Second Diagonal Branch  There is mild disease in the vessel.  Third Diagonal Branch  The vessel exhibits minimal luminal irregularities.  Left Circumflex  Ost Cx to Prox Cx lesion is 30% stenosed. The lesion is moderately calcified.  Mid Cx lesion is 85% stenosed. The lesion is type C. The lesion is moderately calcified.  Second Obtuse Marginal Branch  Vessel is small in size.  Right Coronary Artery  Mid RCA lesion is 100% stenosed.  Right Posterior Descending Artery  Collaterals  RPDA filled by collaterals from Dist LAD.    Right Posterior Atrioventricular Artery  Collaterals  RPAV filled by  collaterals from RV Branch.    Intervention  No interventions have been documented. Coronary Diagrams  Diagnostic Dominance: Right Intervention  Implants     No implant documentation for this case.   Syngo Images   Show images for CARDIAC CATHETERIZATION Images on Long Term Storage   Show images for Loder, Keyaria B Link to Procedure Log  Procedure Log    Hemo Data  Flowsheet Row Most Recent Value  Fick Cardiac Output 4.68 L/min  Fick Cardiac Output Index 2.87 (L/min)/BSA  Aortic Mean Gradient 24.79 mmHg  Aortic Peak Gradient 42 mmHg  Aortic Valve Area 1.00  Aortic Value Area Index 0.61 cm2/BSA  RA A Wave 8 mmHg  RA V Wave 5 mmHg  RA Mean 4 mmHg  RV Systolic Pressure 39 mmHg  RV Diastolic Pressure 3 mmHg  RV EDP 6 mmHg  PA Systolic Pressure 37 mmHg  PA Diastolic Pressure 12 mmHg  PA Mean 23 mmHg  PW A Wave 13 mmHg  PW V Wave 15 mmHg  PW Mean 11 mmHg  AO Systolic Pressure 0000000 mmHg  AO Diastolic Pressure 79 mmHg  AO Mean 99991111 mmHg  LV Systolic Pressure 99991111 mmHg  LV Diastolic Pressure 12 mmHg  LV EDP 18 mmHg  AOp Systolic Pressure 0000000 mmHg  AOp Diastolic Pressure 76 mmHg  AOp Mean Pressure A999333 mmHg  LVp Systolic Pressure 123XX123 mmHg  LVp Diastolic Pressure 12 mmHg  LVp EDP Pressure 18 mmHg  QP/QS 1  TPVR Index 8.03 HRUI  TSVR Index 38.73 HRUI  PVR SVR Ratio 0.11  TPVR/TSVR Ratio 0.21   Myoview Scan: Pharmacological myocardial perfusion imaging study with moderate sized region of ischemia noted in the inferolateral wall  Mild hypokinesis in the inferolateral region, EF estimated at 52% No EKG changes concerning for ischemia at peak stress or in recovery. CT attenuation correction images with mild aortic atherosclerosis, three-vessel coronary calcification High risk scan  STS RISK CALCULATOR: Procedure: AVR + CAB Risk of Mortality: 2.386% Renal Failure: 1.383% Permanent Stroke: 2.668% Prolonged Ventilation: 8.906% DSW  Infection: 0.205% Reoperation: 2.791% Morbidity or Mortality: 12.885% Short Length of Stay: 28.896% Long Length of Stay: 7.030%  ASSESSMENT AND PLAN: 75 year old woman with symptomatic aortic stenosis and coronary artery disease with a combination of shortness of breath and exertional angina, functional class III symptoms. I have  reviewed the natural history of aortic stenosis with the patient and their family members who are present today. We have discussed the limitations of medical therapy and the poor prognosis associated with symptomatic aortic stenosis. We have reviewed potential treatment options, including palliative medical therapy, conventional surgical aortic valve replacement, and transcatheter aortic valve replacement. We discussed treatment options in the context of the patient's specific comorbid medical conditions.  Comorbid medical conditions include hypertension and rheumatoid arthritis on methotrexate and Plaquenil.  The patient's echocardiogram is reviewed which demonstrates calcification and restriction of the aortic valve leaflets with a peak transaortic velocity of 3.4 m/s, peak and mean transvalvular gradients of 46 and 30 mmHg respectively, and a dimensionless index of 0.26.  The patient's cardiac catheterization demonstrates moderate proximal LAD stenosis with a small mid and distal LAD, severe mid circumflex stenosis with a graftable obtuse marginal branch, and total occlusion of the RCA with a collateralized PDA branch.  I discussed potential treatment options with the patient which include PCI of the left circumflex and medical therapy for her residual CAD.  TAVR would be performed if she remains symptomatic after PCI.  Alternatively, she could be treated with multivessel CABG and aortic valve replacement.  I think her PDA branch and circumflex/OM are probably graftable.  This may be the most definitive treatment for her as her aortic stenosis is at least moderately severe.   I have recommended a gated cardiac CTA and a CTA of the chest, abdomen, and pelvis.  Once her CTA is completed, she will be referred for formal cardiac surgical consultation for final recommendation on best treatment.  She understands that anatomic features defined by CTA studies might influence treatment decisions regarding open surgery versus PCI and TAVR.   Deatra James 08/19/2020 5:20 PM     [facility] [address] [place] [address]  [phone] (office) [phone] (fax)

## 2020-08-18 NOTE — Patient Instructions (Addendum)
Medication Instructions:  *If you need a refill on your cardiac medications before your next appointment, please call your pharmacy*  Lab Work: If you have labs (blood work) drawn today and your tests are completely normal, you will receive your results only by: [place] (if you have MyChart) OR A paper copy in the mail If you have any lab test that is abnormal or we need to change your treatment, we will call you to review the results.  Testing/Procedures: Your physician has requested that you have cardiac CT for TAVR. Cardiac computed tomography (CT) is a painless test that uses an x-ray machine to take clear, detailed pictures of your heart. For further information please visit [email or web address]. Please follow instruction sheet as given.  Follow-Up: At [facility], you and your health needs are our priority.  As part of our continuing mission to provide you with exceptional heart care, we have created designated Provider Care Teams.  These Care Teams include your primary Cardiologist (physician) and Advanced Practice Providers (APPs -  Physician Assistants and Nurse Practitioners) who all work together to provide you with the care you need, when you need it.  We recommend signing up for the patient portal called "MyChart".  Sign up information is provided on this After Visit Summary.  MyChart is used to connect with patients for Virtual Visits (Telemedicine).  Patients are able to view lab/test results, encounter notes, upcoming appointments, etc.  Non-urgent messages can be sent to your provider as well.   To learn more about what you can do with MyChart, go to [email or web address].    Your next appointment:   Someone from our team will call you to schedule follow-up.

## 2020-08-19 ENCOUNTER — Encounter: Payer: Self-pay | Admitting: Cardiovascular Disease

## 2020-08-19 ENCOUNTER — Other Ambulatory Visit: Payer: Self-pay

## 2020-08-19 MED ORDER — ATORVASTATIN CALCIUM 80 MG PO TABS: 80.0000 mg | ORAL_TABLET | Freq: Every day | ORAL | 1 refills | Status: DC

## 2020-08-23 ENCOUNTER — Other Ambulatory Visit: Payer: Self-pay

## 2020-08-23 DIAGNOSIS — I350 Nonrheumatic aortic (valve) stenosis: Secondary | ICD-10-CM

## 2020-09-08 ENCOUNTER — Other Ambulatory Visit: Payer: Self-pay

## 2020-09-08 ENCOUNTER — Encounter (HOSPITAL_COMMUNITY): Payer: Self-pay

## 2020-09-08 ENCOUNTER — Ambulatory Visit (HOSPITAL_COMMUNITY)
Admission: RE | Admit: 2020-09-08 | Discharge: 2020-09-08 | Disposition: A | Discharge status: Home / Self Care | Payer: Medicare Other | Source: Ambulatory Visit | Attending: Cardiovascular Disease | Admitting: Cardiovascular Disease

## 2020-09-08 DIAGNOSIS — K5730 Diverticulosis of large intestine without perforation or abscess without bleeding: Secondary | ICD-10-CM | POA: Diagnosis not present

## 2020-09-08 DIAGNOSIS — N200 Calculus of kidney: Secondary | ICD-10-CM | POA: Diagnosis not present

## 2020-09-08 DIAGNOSIS — I350 Nonrheumatic aortic (valve) stenosis: Secondary | ICD-10-CM | POA: Insufficient documentation

## 2020-09-08 DIAGNOSIS — I700 Atherosclerosis of aorta: Secondary | ICD-10-CM | POA: Diagnosis not present

## 2020-09-08 MED ORDER — IOHEXOL 350 MG/ML SOLN
95.0000 mL | Freq: Once | INTRAVENOUS | Status: AC | PRN
  Administered 2020-09-08: 95 mL via INTRAVENOUS

## 2020-09-12 ENCOUNTER — Encounter: Payer: Self-pay | Admitting: Physician Assistant

## 2020-09-12 DIAGNOSIS — E785 Hyperlipidemia, unspecified: Secondary | ICD-10-CM | POA: Insufficient documentation

## 2020-09-12 DIAGNOSIS — I10 Essential (primary) hypertension: Secondary | ICD-10-CM | POA: Insufficient documentation

## 2020-09-22 ENCOUNTER — Ambulatory Visit: Payer: Medicare Other | Admitting: Nurse Practitioner

## 2020-09-28 ENCOUNTER — Other Ambulatory Visit: Payer: Self-pay | Admitting: *Deleted

## 2020-09-28 ENCOUNTER — Encounter: Payer: Self-pay | Admitting: Surgery

## 2020-09-28 ENCOUNTER — Institutional Professional Consult (permissible substitution): Payer: Medicare Other | Admitting: Surgery

## 2020-09-28 ENCOUNTER — Encounter: Payer: Self-pay | Admitting: *Deleted

## 2020-09-28 ENCOUNTER — Other Ambulatory Visit: Payer: Self-pay

## 2020-09-28 VITALS — BP 170/100 | HR 76 | Resp 20 | Ht 59.0 in | Wt 150.0 lb

## 2020-09-28 DIAGNOSIS — I2510 Atherosclerotic heart disease of native coronary artery without angina pectoris: Secondary | ICD-10-CM

## 2020-09-28 DIAGNOSIS — I350 Nonrheumatic aortic (valve) stenosis: Secondary | ICD-10-CM

## 2020-09-28 DIAGNOSIS — M0600 Rheumatoid arthritis without rheumatoid factor, unspecified site: Secondary | ICD-10-CM | POA: Insufficient documentation

## 2020-09-28 DIAGNOSIS — M1990 Unspecified osteoarthritis, unspecified site: Secondary | ICD-10-CM | POA: Insufficient documentation

## 2020-09-28 NOTE — Progress Notes (Addendum)
Cardiothoracic Surgery Consultation   PCP is Hamrick, Lorin Mercy, MD Referring Provider is Kate Sable, MD  Chief Complaint  Patient presents with   Aortic Stenosis   Coronary Artery Disease    Surgical consult, Cardiac Cath 08/10/20, ECHO 05/06/20, Coronary CT and CTA Chest 09/08/20    HPI:  The patient is a 75 year old woman with a history of hypertension, hyperlipidemia, and rheumatoid arthritis on methotrexate for the past few years who was evaluated for a heart murmur and exertional substernal chest discomfort.  A 2D echocardiogram on 05/06/2020 showed a thickened and calcified aortic valve with mean gradient of 28 mmHg and a peak gradient of 47 mmHg.  Dimensionless index was 0.27 consistent with moderate aortic stenosis.  Aortic valve area by VTI was 0.7 cm.  Stroke-volume was 54 cc.  Left ventricular ejection fraction was 70 to 75% with grade 1 diastolic dysfunction.  There is mild mitral valve regurgitation.  She subsequent underwent a nuclear stress test on 07/28/2020 showing mild inferolateral hypokinesis with an ejection fraction of 52%.  There was a moderate size region of ischemia noted in the inferolateral wall.  There were no electrocardiogram changes.  This was a high risk scan.  She underwent cardiac catheterization on 08/10/2020 showing significant three-vessel coronary disease.  The left circumflex had an 85% mid vessel stenosis compromising a large marginal branch.  The LAD has 60% proximal and 40% mid vessel stenosis.  The right coronary was occluded with filling of the distal vessel by collaterals.  A peak to peak gradient across aortic valve was 42 mmHg and the mean gradient was 25 mmHg with a valve area of 1 cm.  The patient reports exertional substernal chest discomfort and shortness of breath with exertion such as walking up steps or an incline.  She has had no symptoms at rest.  She did not think much about the symptoms prior to being told that she had aortic stenosis and  coronary disease.  She denies any dizziness or syncope.  She has had no peripheral edema.  Past Medical History:  Diagnosis Date   CAD (coronary artery disease)    Hyperlipidemia    Hypertension    Obesity     Past Surgical History:  Procedure Laterality Date   RIGHT/LEFT HEART CATH AND CORONARY ANGIOGRAPHY N/A 08/10/2020   Procedure: RIGHT/LEFT HEART CATH AND CORONARY ANGIOGRAPHY;  Surgeon: Wellington Hampshire, MD;  Location: [facility];  Service: Cardiovascular;  Laterality: N/A;    Family History  Problem Relation Age of Onset   Stroke Mother    Stroke Father    Heart attack Father     Social History Social History   Tobacco Use   Smoking status: Never    Passive exposure: Never   Smokeless tobacco: Never  Vaping Use   Vaping Use: Never used  Substance Use Topics   Alcohol use: Never   Drug use: Never    Current Outpatient Medications  Medication Sig Dispense Refill   aspirin EC 81 MG tablet Take 81 mg by mouth in the morning. Swallow whole.     atorvastatin (LIPITOR) 80 MG tablet Take 1 tablet (80 mg total) by mouth daily. 90 tablet 1   augmented betamethasone dipropionate (DIPROLENE-AF) 0.05 % cream Apply 1 application topically 2 (two) times daily as needed (rash on legs).     Calcium Carb-Cholecalciferol (CALCIUM 600+D3 PO) Take 1 tablet by mouth in the morning.     Cholecalciferol (VITAMIN D3) 125 MCG (5000 UT)  TABS Take 5,000 Units by mouth.     folic acid (FOLVITE) 1 MG tablet Take 1 mg by mouth in the morning.     hydroxychloroquine (PLAQUENIL) 200 MG tablet Take 200 mg by mouth in the morning.     lisinopril (ZESTRIL) 20 MG tablet Take 20 mg by mouth in the morning.     loratadine (CLARITIN) 10 MG tablet Take 10 mg by mouth in the morning.     methotrexate (RHEUMATREX) 2.5 MG tablet Take 10 mg by mouth every Monday.     nitroGLYCERIN (NITROSTAT) 0.4 MG SL tablet Place 1 tablet (0.4 mg total) under the tongue every 5 (five) minutes x 3 doses as needed  for chest pain. Up to 3 tabs daily maximum.     Omega-3 1000 MG CAPS Take 1,000 mg by mouth in the morning.     omeprazole (PRILOSEC) 20 MG capsule Take 20 mg by mouth in the morning.     No current facility-administered medications for this visit.    Allergies  Allergen Reactions   Penicillins Rash    Review of Systems  Constitutional:  Positive for activity change and fatigue. Negative for chills and fever.  HENT: Negative.         Last dental visit was 4-1/2 months ago.  Eyes: Negative.   Respiratory:  Positive for shortness of breath.   Cardiovascular:  Positive for chest pain. Negative for palpitations and leg swelling.  Gastrointestinal: Negative.   Endocrine: Negative.   Genitourinary: Negative.   Musculoskeletal:  Positive for arthralgias and joint swelling.       Rheumatoid arthritis primarily affecting her hands.  Skin: Negative.   Allergic/Immunologic: Negative.   Neurological:  Negative for dizziness and syncope.  Hematological: Negative.    BP (!) 170/100   Pulse 76   Resp 20   Ht 4\' 11"  (1.499 m)   Wt 150 lb (68 kg)   SpO2 98% Comment: RA  BMI 30.30 kg/m  Physical Exam Constitutional:      Appearance: Normal appearance.  HENT:     Head: Normocephalic and atraumatic.  Eyes:     Extraocular Movements: Extraocular movements intact.     Conjunctiva/sclera: Conjunctivae normal.     Pupils: Pupils are equal, round, and reactive to light.  Neck:     Comments: Transmitted murmur to both sides of the neck. Cardiovascular:     Rate and Rhythm: Normal rate and regular rhythm.     Pulses: Normal pulses.     Heart sounds: Murmur heard.     Comments: 3/6 systolic murmur along the right sternal border.  There is no diastolic murmur. Pulmonary:     Effort: Pulmonary effort is normal.     Breath sounds: Normal breath sounds.  Abdominal:     General: Bowel sounds are normal. There is no distension.     Tenderness: There is no abdominal tenderness.   Musculoskeletal:        General: No swelling. Normal range of motion.     Cervical back: Normal range of motion and neck supple.  Skin:    General: Skin is warm and dry.  Neurological:     General: No focal deficit present.     Mental Status: She is alert and oriented to person, place, and time.  Psychiatric:        Mood and Affect: Mood normal.        Behavior: Behavior normal.     Diagnostic Tests:  ECHOCARDIOGRAM REPORT  Patient Name:   Shelley Berg Date of Exam: 05/06/2020  Medical Rec #:  [id number]         Height:  Accession #:    [id number]        Weight:  Date of Birth:  [birth year]          BSA:  Patient Age:    3 years          BP:           168/83 mmHg  Patient Gender: F                 HR:           67 bpm.  Exam Location:  Outpatient   Procedure: 2D Echo, Color Doppler and Cardiac Doppler   Indications:    Murmur     History:        Patient has no prior history of Echocardiogram  examinations.     Sonographer:    Bernadene Person RDCS  Referring Phys: [id number] [place]     1. Left ventricular ejection fraction, by estimation, is 70 to 75%. The  left ventricle has hyperdynamic function. Left ventricular diastolic  parameters are consistent with Grade I diastolic dysfunction (impaired  relaxation). Elevated left atrial  pressure.   2. Right ventricular systolic function is normal. The right ventricular  size is normal. There is normal pulmonary artery systolic pressure.   3. Left atrial size was mildly dilated.   4. Mild mitral valve regurgitation.   5. AV is difficult to see. It is thickened, calcified. Peak and mean  gradients through the valve are 47 and 28 mm Hg respectively Dimensionless  index is 0.3 consistent with moderate severe \ AS. Marland Kitchen The aortic valve was  not well visualized. Aortic valve  regurgitation is not visualized.   6. The inferior vena cava is normal in size with greater than 50%  respiratory variability,  suggesting right atrial pressure of 3 mmHg.   7. Aortic dilatation noted. There is mild dilatation of the ascending  aorta, measuring 42 mm.   Conclusion(s)/Recommendation(s): Would recommend follow up echo in 6  months to reevaluate aortic valve.   FINDINGS   Left Ventricle: Left ventricular ejection fraction, by estimation, is 70  to 75%. The left ventricle has hyperdynamic function. The left ventricular  internal cavity size was normal in size. Left ventricular diastolic  parameters are consistent with Grade  I diastolic dysfunction (impaired relaxation). Elevated left atrial  pressure.   Right Ventricle: The right ventricular size is normal. Right vetricular  wall thickness was not assessed. Right ventricular systolic function is  normal. There is normal pulmonary artery systolic pressure. The tricuspid  regurgitant velocity is 1.81 m/s,  and with an assumed right atrial pressure of 3 mmHg, the estimated right  ventricular systolic pressure is 45.6 mmHg.   Left Atrium: Left atrial size was mildly dilated.   Right Atrium: Right atrial size was normal in size.   Pericardium: There is no evidence of pericardial effusion.   Mitral Valve: There is mild thickening of the mitral valve leaflet(s).  Mild mitral annular calcification. Mild mitral valve regurgitation.   Tricuspid Valve: The tricuspid valve is grossly normal. Tricuspid valve  regurgitation is trivial.   Aortic Valve: AV is difficult to see. It is thickened, calcified. Peak and  mean gradients through the valve are 47 and 28 mm Hg respectively  Dimensionless index is 0.3 consistent with moderately  AS. The aortic valve  was not well visualized. Aortic valve  regurgitation is not visualized. Aortic valve mean gradient measures 27.6  mmHg. Aortic valve peak gradient measures 43.0 mmHg. Aortic valve area, by  VTI measures 0.70 cm.   Pulmonic Valve: The pulmonic valve was normal in structure. Pulmonic valve   regurgitation is not visualized.   Aorta: The aortic root is normal in size and structure and aortic  dilatation noted. There is mild dilatation of the ascending aorta,  measuring 42 mm.   Venous: The inferior vena cava is normal in size with greater than 50%  respiratory variability, suggesting right atrial pressure of 3 mmHg.   IAS/Shunts: The interatrial septum was not assessed.      LEFT VENTRICLE  PLAX 2D  LVIDd:         3.50 cm  Diastology  LVIDs:         2.10 cm  LV e' medial:    4.22 cm/s  LV PW:         1.00 cm  LV E/e' medial:  21.4  LV IVS:        0.90 cm  LV e' lateral:   3.24 cm/s  LVOT diam:     1.80 cm  LV E/e' lateral: 27.8  LV SV:         54  LVOT Area:     2.54 cm      RIGHT VENTRICLE  RV S prime:     8.70 cm/s  TAPSE (M-mode): 1.2 cm   LEFT ATRIUM             RIGHT ATRIUM  LA diam:        3.50 cm RA Area:     7.01 cm  LA Vol (A2C):   20.3 ml RA Volume:   10.30 ml  LA Vol (A4C):   38.6 ml  LA Biplane Vol: 28.8 ml   AORTIC VALVE  AV Area (Vmax):    0.69 cm  AV Area (Vmean):   0.63 cm  AV Area (VTI):     0.70 cm  AV Vmax:           327.80 cm/s  AV Vmean:          248.600 cm/s  AV VTI:            0.770 m  AV Peak Grad:      43.0 mmHg  AV Mean Grad:      27.6 mmHg  LVOT Vmax:         89.40 cm/s  LVOT Vmean:        62.000 cm/s  LVOT VTI:          0.211 m  LVOT/AV VTI ratio: 0.27     AORTA  Ao Root diam: 3.20 cm  Ao Asc diam:  4.20 cm   MITRAL VALVE                TRICUSPID VALVE  MV Area (PHT): 3.27 cm     TR Peak grad:   13.1 mmHg  MV Decel Time: 232 msec     TR Vmax:        181.00 cm/s  MV E velocity: 90.20 cm/s  MV A velocity: 112.00 cm/s  SHUNTS  MV E/A ratio:  0.81         Systemic VTI:  0.21 m  Systemic Diam: 1.80 cm   Dorris Carnes MD  Electronically signed by Dorris Carnes MD  Signature Date/Time: 05/06/2020/6:06:30 PM      Physicians Panel Physicians Referring Physician Case Authorizing Physician   Wellington Hampshire, MD (Primary)    Procedures RIGHT/LEFT HEART CATH AND CORONARY ANGIOGRAPHY  Conclusion  Prox LAD lesion is 60% stenosed.   Mid LAD lesion is 40% stenosed.   Ost Cx to Prox Cx lesion is 30% stenosed.   Mid Cx lesion is 85% stenosed.   Mid RCA lesion is 100% stenosed.  1. Significant calcified three-vessel coronary artery disease. Chronically occluded mid right coronary artery with collaterals. LAD disease is borderline.  2. Left ventricular angiography was not performed. EF was normal by echo.  Right heart catheterization was performed due to presence of aortic stenosis. It showed normal right and left-sided filling pressures, mild pulmonary hypertension and normal cardiac output.  3. Moderate to severe aortic stenosis with peak to peak gradient of 42 mmHg and valve area of 1.00 cm.  Recommendations:  Given the presence of calcified three-vessel coronary artery disease and moderate to severe aortic stenosis, we have to discuss management options with the heart team including CABG plus AVR versus PCI plus TAVR. The LAD disease is not critical and RCA is already collateralized. Options for PCI include treatment of the left circumflex and leaving everything else to be treated medically with plans for TAVR in the near future. I will discuss with colleagues.  Procedural Details Technical Details Procedural Details: Using the modified Seldinger technique, a 5 French sheath was introduced into the right radial artery. 3 mg of verapamil was administered through the sheath, weight-based unfractionated heparin was administered intravenously. A Jackie catheter was used for selective coronary angiography and LV pressure with pullback. A JR4 catheter was needed to engage the right coronary artery. Catheter exchanges were performed over an exchange length guidewire. Due to presence of aortic stenosis, I elected to perform right heart catheterization for better decision-making. The pre-existing  IV in the right antecubital vein was exchanged under sterile fashion to a slender sheath. The right wrist was prepped, draped, and anesthetized with 1% lidocaine. Right heart catheterization was performed using a 5 French Swan-Ganz catheter. Cardiac output was calculated by the Fick method. There were no immediate procedural complications. A TR band was used for radial hemostasis at the completion of the procedure. The patient was transferred to the post catheterization recovery area for further monitoring.    Estimated blood loss <50 mL.   During this procedure medications were administered to achieve and maintain moderate conscious sedation while the patient's heart rate, blood pressure, and oxygen saturation were continuously monitored and I was present face-to-face 100% of this time.  Medications (Filter: Administrations occurring from 1215 to 1327 on 08/10/20)  important Continuous medications are totaled by the amount administered until 08/10/20 1327.  Heparin (Porcine) in NaCl 1000-0.9 UT/500ML-% SOLN (mL) Total volume: 500 mL  Date/Time Rate/Dose/Volume Action    08/10/20 1226 500 mL Given   Heparin (Porcine) in NaCl 1000-0.9 UT/500ML-% SOLN (mL) Total volume: 500 mL  Date/Time Rate/Dose/Volume Action    08/10/20 1226 500 mL Given   midazolam (VERSED) injection (mg) Total dose: 1 mg  Date/Time Rate/Dose/Volume Action    08/10/20 1233 1 mg Given   fentaNYL (SUBLIMAZE) injection (mcg) Total dose: 25 mcg  Date/Time Rate/Dose/Volume Action    08/10/20 1233 25 mcg Given   lidocaine (PF) (XYLOCAINE) 1 % injection (mL) Total volume: 6 mL  Date/Time Rate/Dose/Volume Action    08/10/20 1242 3 mL Given   1302 3 mL Given   Radial Cocktail/Verapamil only (mL) Total volume: 10 mL  Date/Time Rate/Dose/Volume Action    08/10/20 1244 10 mL Given   heparin sodium (porcine) injection (Units) Total dose: 3,000 Units  Date/Time Rate/Dose/Volume Action    08/10/20 1248 3,000 Units  Given   iohexol (OMNIPAQUE) 350 MG/ML injection (mL) Total volume: 60 mL  Date/Time Rate/Dose/Volume Action    08/10/20 1324 60 mL Given   Sedation Time Sedation Time Physician-1: 41 minutes 46 seconds  Contrast Medication Name Total Dose  iohexol (OMNIPAQUE) 350 MG/ML injection 60 mL  Radiation/Fluoro Fluoro time: 5.1 (min)  DAP: 09628 (mGycm2)  Cumulative Air Kerma: 366 (mGy)  Coronary Findings Diagnostic Dominance: Right  Left Main  Vessel is angiographically normal.   Left Anterior Descending  Prox LAD lesion is 60% stenosed. The lesion is severely calcified.  Mid LAD lesion is 40% stenosed. The lesion is moderately calcified.   Second Diagonal Branch  There is mild disease in the vessel.   Third Diagonal Branch  The vessel exhibits minimal luminal irregularities.   Left Circumflex  Ost Cx to Prox Cx lesion is 30% stenosed. The lesion is moderately calcified.  Mid Cx lesion is 85% stenosed. The lesion is type C. The lesion is moderately calcified.   Second Obtuse Marginal Branch  Vessel is small in size.   Right Coronary Artery  Mid RCA lesion is 100% stenosed.   Right Posterior Descending Artery  Collaterals  RPDA filled by collaterals from Dist LAD.     Right Posterior Atrioventricular Artery  Collaterals  RPAV filled by collaterals from RV Branch.    Intervention No interventions have been documented.  Coronary Diagrams Diagnostic Dominance: Right  &&&&&&  Intervention Implants  No implant documentation for this case.   Syngo Images Show images for CARDIAC CATHETERIZATION  Images on Long Term Storage Show images for Winchel, NAIMA VELDHUIZEN to Procedure Log   Procedure Log  Hemo Data Flowsheet Row Most Recent Value  Fick Cardiac Output 4.68 L/min  Fick Cardiac Output Index 2.87 (L/min)/BSA  Aortic Mean Gradient 24.79 mmHg  Aortic Peak Gradient 42 mmHg  Aortic Valve Area 1.00  Aortic Value Area Index 0.61 cm2/BSA  RA A Wave 8 mmHg  RA V  Wave 5 mmHg  RA Mean 4 mmHg  RV Systolic Pressure 39 mmHg  RV Diastolic Pressure 3 mmHg  RV EDP 6 mmHg  PA Systolic Pressure 37 mmHg  PA Diastolic Pressure 12 mmHg  PA Mean 23 mmHg  PW A Wave 13 mmHg  PW V Wave 15 mmHg  PW Mean 11 mmHg  AO Systolic Pressure 294 mmHg  AO Diastolic Pressure 79 mmHg  AO Mean 765 mmHg  LV Systolic Pressure 465 mmHg  LV Diastolic Pressure 12 mmHg  LV EDP 18 mmHg  AOp Systolic Pressure 035 mmHg  AOp Diastolic Pressure 76 mmHg  AOp Mean Pressure 465 mmHg  LVp Systolic Pressure 681 mmHg  LVp Diastolic Pressure 12 mmHg  LVp EDP Pressure 18 mmHg  QP/QS 1  TPVR Index 8.03 HRUI  TSVR Index 38.73 HRUI  PVR SVR Ratio 0.11  TPVR/TSVR Ratio 0.21    Impression:  This 75 year old woman has stage D, moderate to severe symptomatic aortic stenosis and severe three-vessel coronary disease with New York Heart Association class II symptoms of exertional chest discomfort and shortness of breath consistent with chronic diastolic congestive heart failure and stable exertional  angina.  I have personally reviewed her 2D echocardiogram and cardiac catheterization studies.  Her echo shows a calcified and thickened aortic valve with restricted leaflet mobility.  The mean gradient is 28 mmHg with a dimensionless index of 0.27 and a valve area of 0.7 cm consistent with moderate to severe aortic stenosis.  Left ventricular systolic function is normal.  Cardiac catheterization shows significant three-vessel coronary disease with the most significant lesion being 85% mid left circumflex stenosis compromising a large marginal branch.  There is also mid RCA occlusion with faint filling of the posterior descending branch by collaterals.  The LAD has moderate proximal to mid vessel disease that is probably not flow-limiting with a relatively small distal vessel.  I agree that the best treatment for this patient is coronary bypass graft surgery and aortic valve replacement.  We discussed the  alternatives of PCI of the left circumflex followed by TAVR if she remains symptomatic but her annulus is borderline for a 23 mm SAPIEN 3 valve and since she has a calcified bicuspid aortic valve I think she would be better off with open surgical aortic valve replacement.  Her gated cardiac CTA shows severely reduced cusp separation with severely thickened and calcified aortic valve cusps consistent with severe aortic stenosis.  I reviewed all of the above findings with her and my recommendation for open surgical AVR and CABG using a bioprosthetic valve.  I would probably leave the LAD alone since the degree of stenosis is only moderate, it is a relatively small vessel distally, and she is a relatively short woman who is likely to have a short, small LIMA that would probably not be ideal.  I discussed the operative procedure with the patient and her daughter including alternatives, benefits and risks; including but not limited to bleeding, blood transfusion, infection, stroke, myocardial infarction, graft failure, heart block requiring a permanent pacemaker, organ dysfunction, and death.  Shelley Berg understands and agrees to proceed.    Plan:  AVR using a bioprosthetic valve and CABG on 11/14/20 since she has a trip planned to the mountains in late [place] that she does not want to cancel. She understands that if her symptoms worsen she is to let us know immediately. She will need to stop her Methotrexate 2 weeks preop and I discussed this with her and her daughter.  I spent 60 minutes performing this consultation and > 50% of this time was spent face to face counseling and coordinating the care of this patient's symptomatic aortic stenosis and multi-vessel coronary artery disease.   Gaye Pollack, MD [facility] [phone]

## 2020-10-04 DIAGNOSIS — E1169 Type 2 diabetes mellitus with other specified complication: Secondary | ICD-10-CM | POA: Diagnosis not present

## 2020-10-04 DIAGNOSIS — Z79899 Other long term (current) drug therapy: Secondary | ICD-10-CM | POA: Diagnosis not present

## 2020-10-04 DIAGNOSIS — E782 Mixed hyperlipidemia: Secondary | ICD-10-CM | POA: Diagnosis not present

## 2020-10-06 DIAGNOSIS — I2510 Atherosclerotic heart disease of native coronary artery without angina pectoris: Secondary | ICD-10-CM | POA: Diagnosis not present

## 2020-10-06 DIAGNOSIS — E782 Mixed hyperlipidemia: Secondary | ICD-10-CM | POA: Diagnosis not present

## 2020-10-06 DIAGNOSIS — E1169 Type 2 diabetes mellitus with other specified complication: Secondary | ICD-10-CM | POA: Diagnosis not present

## 2020-10-06 DIAGNOSIS — Z23 Encounter for immunization: Secondary | ICD-10-CM | POA: Diagnosis not present

## 2020-10-06 DIAGNOSIS — E785 Hyperlipidemia, unspecified: Secondary | ICD-10-CM | POA: Diagnosis not present

## 2020-10-06 DIAGNOSIS — I10 Essential (primary) hypertension: Secondary | ICD-10-CM | POA: Diagnosis not present

## 2020-10-06 DIAGNOSIS — I350 Nonrheumatic aortic (valve) stenosis: Secondary | ICD-10-CM | POA: Diagnosis not present

## 2020-11-03 DIAGNOSIS — R768 Other specified abnormal immunological findings in serum: Secondary | ICD-10-CM | POA: Diagnosis not present

## 2020-11-03 DIAGNOSIS — M8580 Other specified disorders of bone density and structure, unspecified site: Secondary | ICD-10-CM | POA: Diagnosis not present

## 2020-11-03 DIAGNOSIS — M0600 Rheumatoid arthritis without rheumatoid factor, unspecified site: Secondary | ICD-10-CM | POA: Diagnosis not present

## 2020-11-03 DIAGNOSIS — Z79899 Other long term (current) drug therapy: Secondary | ICD-10-CM | POA: Diagnosis not present

## 2020-11-03 DIAGNOSIS — M1990 Unspecified osteoarthritis, unspecified site: Secondary | ICD-10-CM | POA: Diagnosis not present

## 2020-11-08 NOTE — Progress Notes (Signed)
Surgical Instructions    Your procedure is scheduled on 11/11/20.  Report to [facility] Main Entrance "A" at 5:30 A.M., then check in with the Admitting office.  Call this number if you have problems the morning of surgery:  [phone]   If you have any questions prior to your surgery date call [phone]: Open Monday-Friday 8am-4pm    Remember:  Do not eat or drink after midnight the night before your surgery    Take these medicines the morning of surgery with A SIP OF WATER  atorvastatin (LIPITOR) loratadine (CLARITIN)  omeprazole (PRILOSEC)   IF NEEDED: acetaminophen (TYLENOL) nitroGLYCERIN (NITROSTAT)   As of today, STOP taking any Aspirin (unless otherwise instructed by your surgeon) Aleve, Naproxen, Ibuprofen, Motrin, Advil, Goody's, BC's, all herbal medications, fish oil, and all vitamins.   STOP taking hydroxychloroquine (PLAQUENIL) and methotrexate (RHEUMATREX) 3 days prior to surgery.    After your COVID test   You are not required to quarantine however you are required to wear a well-fitting mask when you are out and around people not in your household.  If your mask becomes wet or soiled, replace with a new one.  Wash your hands often with soap and water for 20 seconds or clean your hands with an alcohol-based hand sanitizer that contains at least 60% alcohol.  Do not share personal items.  Notify your provider: if you are in close contact with someone who has COVID  or if you develop a fever of 100.4 or greater, sneezing, cough, sore throat, shortness of breath or body aches.             Do not wear jewelry or makeup Do not wear lotions, powders, perfumes/colognes, or deodorant. Do not shave 48 hours prior to surgery.  Men may shave face and neck. Do not bring valuables to the hospital. DO Not wear nail polish, gel polish, artificial nails, or any other type of covering on natural nails including finger and toenails. If patients have artificial nails,  gel coating, etc. that need to be removed by a nail salon, please have this removed prior to surgery or surgery may need to be canceled/delayed if the surgeon/ anesthesia feels like the patient is unable to be adequately monitored.             [facility] is not responsible for any belongings or valuables.  Do NOT Smoke (Tobacco/Vaping)  24 hours prior to your procedure  If you use a CPAP at night, you may bring your mask for your overnight stay.   Contacts, glasses, hearing aids, dentures or partials may not be worn into surgery, please bring cases for these belongings   For patients admitted to the hospital, discharge time will be determined by your treatment team.   Patients discharged the day of surgery will not be allowed to drive home, and someone needs to stay with them for 24 hours.  NO VISITORS WILL BE ALLOWED IN PRE-OP WHERE PATIENTS ARE PREPPED FOR SURGERY.  ONLY 1 SUPPORT PERSON MAY BE PRESENT IN THE WAITING ROOM WHILE YOU ARE IN SURGERY.  IF YOU ARE TO BE ADMITTED, ONCE YOU ARE IN YOUR ROOM YOU WILL BE ALLOWED TWO (2) VISITORS. 1 (ONE) VISITOR MAY STAY OVERNIGHT BUT MUST ARRIVE TO THE ROOM BY 8pm.  Minor children may have two parents present. Special consideration for safety and communication needs will be reviewed on a case by case basis.  Special instructions:    Oral Hygiene is also important to  reduce your risk of infection.  Remember - BRUSH YOUR TEETH THE MORNING OF SURGERY WITH YOUR REGULAR TOOTHPASTE   [facility]- Preparing For Surgery  Before surgery, you can play an important role. Because skin is not sterile, your skin needs to be as free of germs as possible. You can reduce the number of germs on your skin by washing with CHG (chlorahexidine gluconate) Soap before surgery.  CHG is an antiseptic cleaner which kills germs and bonds with the skin to continue killing germs even after washing.     Please do not use if you have an allergy to CHG or antibacterial soaps.  If your skin becomes reddened/irritated stop using the CHG.  Do not shave (including legs and underarms) for at least 48 hours prior to first CHG shower. It is OK to shave your face.  Please follow these instructions carefully.     Shower the NIGHT BEFORE SURGERY and the MORNING OF SURGERY with CHG Soap.   If you chose to wash your hair, wash your hair first as usual with your normal shampoo. After you shampoo, rinse your hair and body thoroughly to remove the shampoo.  Then [employer] and genitals (private parts) with your normal soap and rinse thoroughly to remove soap.  After that Use CHG Soap as you would any other liquid soap. You can apply CHG directly to the skin and wash gently with a scrungie or a clean washcloth.   Apply the CHG Soap to your body ONLY FROM THE NECK DOWN.  Do not use on open wounds or open sores. Avoid contact with your eyes, ears, mouth and genitals (private parts). Wash Face and genitals (private parts)  with your normal soap.   Wash thoroughly, paying special attention to the area where your surgery will be performed.  Thoroughly rinse your body with warm water from the neck down.  DO NOT shower/wash with your normal soap after using and rinsing off the CHG Soap.  Pat yourself dry with a CLEAN TOWEL.  Wear CLEAN PAJAMAS to bed the night before surgery  Place CLEAN SHEETS on your bed the night before your surgery  DO NOT SLEEP WITH PETS.   Day of Surgery:  Take a shower with CHG soap. Wear Clean/Comfortable clothing the morning of surgery Do not apply any deodorants/lotions.   Remember to brush your teeth WITH YOUR REGULAR TOOTHPASTE.   Please read over the following fact sheets that you were given.

## 2020-11-09 ENCOUNTER — Other Ambulatory Visit: Payer: Self-pay

## 2020-11-09 ENCOUNTER — Encounter (HOSPITAL_COMMUNITY): Payer: Self-pay

## 2020-11-09 ENCOUNTER — Encounter (HOSPITAL_COMMUNITY)
Admission: RE | Admit: 2020-11-09 | Discharge: 2020-11-09 | Disposition: A | Discharge status: Home / Self Care | Payer: Medicare Other | Source: Ambulatory Visit | Attending: Surgery | Admitting: Surgery

## 2020-11-09 ENCOUNTER — Ambulatory Visit (HOSPITAL_BASED_OUTPATIENT_CLINIC_OR_DEPARTMENT_OTHER)
Admission: RE | Admit: 2020-11-09 | Discharge: 2020-11-09 | Disposition: A | Discharge status: Home / Self Care | Payer: Medicare Other | Source: Ambulatory Visit | Attending: Surgery | Admitting: Surgery

## 2020-11-09 ENCOUNTER — Ambulatory Visit (HOSPITAL_COMMUNITY)
Admission: RE | Admit: 2020-11-09 | Discharge: 2020-11-09 | Disposition: A | Discharge status: Home / Self Care | Payer: Medicare Other | Source: Ambulatory Visit | Attending: Surgery | Admitting: Surgery

## 2020-11-09 ENCOUNTER — Encounter (HOSPITAL_COMMUNITY): Payer: Self-pay | Admitting: Surgery

## 2020-11-09 VITALS — BP 144/87 | HR 72 | Temp 98.6°F | Resp 17 | Ht 59.0 in | Wt 149.3 lb

## 2020-11-09 DIAGNOSIS — Z951 Presence of aortocoronary bypass graft: Secondary | ICD-10-CM | POA: Insufficient documentation

## 2020-11-09 DIAGNOSIS — E785 Hyperlipidemia, unspecified: Secondary | ICD-10-CM | POA: Insufficient documentation

## 2020-11-09 DIAGNOSIS — I2510 Atherosclerotic heart disease of native coronary artery without angina pectoris: Secondary | ICD-10-CM

## 2020-11-09 DIAGNOSIS — I10 Essential (primary) hypertension: Secondary | ICD-10-CM | POA: Insufficient documentation

## 2020-11-09 DIAGNOSIS — Z01818 Encounter for other preprocedural examination: Secondary | ICD-10-CM | POA: Insufficient documentation

## 2020-11-09 DIAGNOSIS — Z20822 Contact with and (suspected) exposure to covid-19: Secondary | ICD-10-CM | POA: Insufficient documentation

## 2020-11-09 DIAGNOSIS — I350 Nonrheumatic aortic (valve) stenosis: Secondary | ICD-10-CM | POA: Insufficient documentation

## 2020-11-09 HISTORY — DX: Unspecified asthma, uncomplicated: J45.909

## 2020-11-09 HISTORY — DX: Prediabetes: R73.03

## 2020-11-09 HISTORY — DX: Cardiac murmur, unspecified: R01.1

## 2020-11-09 HISTORY — DX: Other seasonal allergic rhinitis: J30.2

## 2020-11-09 HISTORY — DX: Dyspnea, unspecified: R06.00

## 2020-11-09 LAB — CBC
HCT: 40.7 % (ref 36.0–46.0)
Hemoglobin: 13.8 g/dL (ref 12.0–15.0)
MCH: 31.8 pg (ref 26.0–34.0)
MCHC: 33.9 g/dL (ref 30.0–36.0)
MCV: 93.8 fL (ref 80.0–100.0)
Platelets: 311 10*3/uL (ref 150–400)
RBC: 4.34 MIL/uL (ref 3.87–5.11)
RDW: 12.8 % (ref 11.5–15.5)
WBC: 8.1 10*3/uL (ref 4.0–10.5)
nRBC: 0 % (ref 0.0–0.2)

## 2020-11-09 LAB — URINALYSIS, ROUTINE W REFLEX MICROSCOPIC
Bacteria, UA: NONE SEEN
Bilirubin Urine: NEGATIVE
Glucose, UA: NEGATIVE mg/dL
Hgb urine dipstick: NEGATIVE
Ketones, ur: NEGATIVE mg/dL
Nitrite: NEGATIVE
Protein, ur: NEGATIVE mg/dL
Specific Gravity, Urine: 1.006 (ref 1.005–1.030)
pH: 6 (ref 5.0–8.0)

## 2020-11-09 LAB — COMPREHENSIVE METABOLIC PANEL
ALT: 22 U/L (ref 0–44)
AST: 27 U/L (ref 15–41)
Albumin: 3.8 g/dL (ref 3.5–5.0)
Alkaline Phosphatase: 68 U/L (ref 38–126)
Anion gap: 9 (ref 5–15)
BUN: 10 mg/dL (ref 8–23)
CO2: 25 mmol/L (ref 22–32)
Calcium: 9.2 mg/dL (ref 8.9–10.3)
Chloride: 106 mmol/L (ref 98–111)
Creatinine, Ser: 0.79 mg/dL (ref 0.44–1.00)
GFR, Estimated: >60 mL/min (ref >60)
Glucose, Bld: 144 mg/dL — ABNORMAL HIGH (ref 70–99)
Potassium: 3.8 mmol/L (ref 3.5–5.1)
Sodium: 140 mmol/L (ref 135–145)
Total Bilirubin: 0.8 mg/dL (ref 0.3–1.2)
Total Protein: 6.6 g/dL (ref 6.5–8.1)

## 2020-11-09 LAB — BLOOD GAS, ARTERIAL
Acid-base deficit: 1 mmol/L (ref 0.0–2.0)
Bicarbonate: 22.8 mmol/L (ref 20.0–28.0)
Drawn by: 58793
FIO2: 21
O2 Saturation: 96.7 %
Patient temperature: 37
pCO2 arterial: 35.6 mmHg (ref 32.0–48.0)
pH, Arterial: 7.423 (ref 7.350–7.450)
pO2, Arterial: 82.7 mmHg — ABNORMAL LOW (ref 83.0–108.0)

## 2020-11-09 LAB — SURGICAL PCR SCREEN
MRSA, PCR: NEGATIVE
Staphylococcus aureus: NEGATIVE

## 2020-11-09 LAB — HEMOGLOBIN A1C
Hgb A1c MFr Bld: 6.2 % — ABNORMAL HIGH (ref 4.8–5.6)
Mean Plasma Glucose: 131.24 mg/dL

## 2020-11-09 LAB — PROTIME-INR
INR: 0.9 (ref 0.8–1.2)
Prothrombin Time: 12.6 seconds (ref 11.4–15.2)

## 2020-11-09 LAB — APTT: aPTT: 25 seconds (ref 24–36)

## 2020-11-09 NOTE — Progress Notes (Signed)
DUE TO COVID-19 ONLY ONE VISITOR IS ALLOWED TO COME WITH YOU AND STAY IN THE WAITING ROOM ONLY DURING PRE OP AND PROCEDURE DAY OF SURGERY.  Two  VISITORS  MAY VISIT WITH YOU AFTER SURGERY IN YOUR PRIVATE ROOM DURING VISITING HOURS ONLY!   PCP - Dr Cristela Blue [facility] Cardiologist - Dr Aaron Edelman Ag[place]Etang  Chest x-ray - 11/09/20 EKG - 11/09/20 Stress Test - 07/28/20 ECHO - 05/06/20 Cardiac Cath - 08/10/20  ICD Pacemaker/Loop - n/a  Sleep Study -  n/a CPAP - none  Aspirin Instructions: Follow your surgeon's instructions on when to stop aspirin prior to surgery,  If no instructions were given by your surgeon then you will need to call the office for those instructions.  Anesthesia review: Yes  STOP now taking any Aspirin (unless otherwise instructed by your surgeon), Aleve, Naproxen, Ibuprofen, Motrin, Advil, Goody's, BC's, all herbal medications, fish oil, and all vitamins.   Coronavirus Screening Covid test is scheduled at PAT appt.  Do you have any of the following symptoms:  Cough yes/no: No Fever (>100.68F)  yes/no: No Runny nose yes/no: No Sore throat yes/no: No Difficulty breathing/shortness of breath  yes/no: No  Have you traveled in the last 14 days and where? yes/no: No  Patient verbalized understanding of instructions that were given to them at the PAT appointment.

## 2020-11-09 NOTE — Progress Notes (Signed)
DUE TO COVID-19 ONLY ONE VISITOR IS ALLOWED TO COME WITH YOU AND STAY IN THE WAITING ROOM ONLY DURING PRE OP AND PROCEDURE DAY OF SURGERY.   Two  VISITORS  MAY VISIT WITH YOU AFTER SURGERY IN YOUR PRIVATE ROOM DURING VISITING HOURS ONLY!  PCP - Dr Cristela Blue [facility] Cardiologist - Dr Sherren Mocha  Chest x-ray - 11/09/20 EKG - 11/09/20 Stress Test - 07/28/20 ECHO - 05/06/20 Cardiac Cath - 08/10/20  ICD Pacemaker/Loop - n/a  Sleep Study -  n/a CPAP - none  Aspirin Instructions: Follow your surgeon's instructions on when to stop aspirin prior to surgery,  If no instructions were given by your surgeon then you will need to call the office for those instructions.  Anesthesia review: Yes  STOP now taking any Aspirin (unless otherwise instructed by your surgeon), Aleve, Naproxen, Ibuprofen, Motrin, Advil, Goody's, BC's, all herbal medications, fish oil, and all vitamins.   Coronavirus Screening Covid test is scheduled on 11/09/20 Do you have any of the following symptoms:  Cough yes/no: No Fever (>100.43F)  yes/no: No Runny nose yes/no: No Sore throat yes/no: No Difficulty breathing/shortness of breath  yes/no: No  Have you traveled in the last 14 days and where? yes/no: No  Patient verbalized understanding of instructions that were given to them at the PAT appointment. Patient was also re-instructed for DOS and asked patient to review the PAT instructions that was printed and given at the PAT appointment prior to surgery.

## 2020-11-10 LAB — SARS CORONAVIRUS 2 (TAT 6-24 HRS): SARS Coronavirus 2: NEGATIVE

## 2020-11-10 MED ORDER — TRANEXAMIC ACID (OHS) PUMP PRIME SOLUTION
2.0000 mg/kg | INTRAVENOUS | Status: DC
  Filled 2020-11-10: qty 1.35

## 2020-11-10 MED ORDER — PHENYLEPHRINE HCL-NACL 20-0.9 MG/250ML-% IV SOLN
30.0000 ug/min | INTRAVENOUS | Status: AC
  Administered 2020-11-11: 30 ug/min via INTRAVENOUS
  Filled 2020-11-10: qty 250

## 2020-11-10 MED ORDER — VANCOMYCIN HCL 1250 MG/250ML IV SOLN
1250.0000 mg | INTRAVENOUS | Status: AC
  Administered 2020-11-11: 1250 mg via INTRAVENOUS
  Filled 2020-11-10: qty 250

## 2020-11-10 MED ORDER — INSULIN REGULAR(HUMAN) IN NACL 100-0.9 UT/100ML-% IV SOLN
INTRAVENOUS | Status: AC
  Administered 2020-11-11: 1.3 [IU]/h via INTRAVENOUS
  Filled 2020-11-10: qty 100

## 2020-11-10 MED ORDER — TRANEXAMIC ACID (OHS) BOLUS VIA INFUSION
15.0000 mg/kg | INTRAVENOUS | Status: AC
  Administered 2020-11-11: 1015.5 mg via INTRAVENOUS
  Filled 2020-11-10: qty 1016

## 2020-11-10 MED ORDER — MILRINONE LACTATE IN DEXTROSE 20-5 MG/100ML-% IV SOLN
0.3000 ug/kg/min | INTRAVENOUS | Status: DC
  Filled 2020-11-10: qty 100

## 2020-11-10 MED ORDER — NITROGLYCERIN IN D5W 200-5 MCG/ML-% IV SOLN
2.0000 ug/min | INTRAVENOUS | Status: AC
  Administered 2020-11-11: 5 ug/min via INTRAVENOUS
  Filled 2020-11-10: qty 250

## 2020-11-10 MED ORDER — PLASMA-LYTE A IV SOLN
INTRAVENOUS | Status: DC
  Filled 2020-11-10: qty 5

## 2020-11-10 MED ORDER — DEXMEDETOMIDINE HCL IN NACL 400 MCG/100ML IV SOLN
0.1000 ug/kg/h | INTRAVENOUS | Status: AC
  Administered 2020-11-11: .5 ug/kg/h via INTRAVENOUS
  Filled 2020-11-10: qty 100

## 2020-11-10 MED ORDER — MANNITOL 20 % IV SOLN
INTRAVENOUS | Status: DC
  Filled 2020-11-10: qty 13

## 2020-11-10 MED ORDER — POTASSIUM CHLORIDE 2 MEQ/ML IV SOLN
80.0000 meq | INTRAVENOUS | Status: DC
  Filled 2020-11-10: qty 40

## 2020-11-10 MED ORDER — EPINEPHRINE HCL 5 MG/250ML IV SOLN IN NS
0.0000 ug/min | INTRAVENOUS | Status: DC
  Filled 2020-11-10: qty 250

## 2020-11-10 MED ORDER — HEPARIN 30,000 UNITS/1000 ML (OHS) CELLSAVER SOLUTION
Status: DC
  Filled 2020-11-10: qty 1000

## 2020-11-10 MED ORDER — LEVOFLOXACIN IN D5W 500 MG/100ML IV SOLN
500.0000 mg | INTRAVENOUS | Status: AC
  Administered 2020-11-11: 500 mg via INTRAVENOUS
  Filled 2020-11-10: qty 100

## 2020-11-10 MED ORDER — TRANEXAMIC ACID 1000 MG/10ML IV SOLN
1.5000 mg/kg/h | INTRAVENOUS | Status: AC
  Administered 2020-11-11: 1.5 mg/kg/h via INTRAVENOUS
  Filled 2020-11-10: qty 25

## 2020-11-10 MED ORDER — NOREPINEPHRINE 4 MG/250ML-% IV SOLN
0.0000 ug/min | INTRAVENOUS | Status: DC
  Filled 2020-11-10: qty 250

## 2020-11-10 NOTE — Anesthesia Preprocedure Evaluation (Addendum)
Anesthesia Evaluation  Patient identified by MRN, date of birth, ID band Patient awake    Reviewed: Allergy & Precautions, NPO status , Patient's Chart, lab work & pertinent test results  History of Anesthesia Complications Negative for: history of anesthetic complications  Airway Mallampati: II  TM Distance: >3 FB Neck ROM: Full    Dental no notable dental hx. (+) Dental Advisory Given   Pulmonary neg pulmonary ROS,    Pulmonary exam normal        Cardiovascular hypertension, Pt. on medications + CAD  Normal cardiovascular exam+ Valvular Problems/Murmurs AS   Cardiac Cath .  Prox LAD lesion is 60% stenosed. .  Mid LAD lesion is 40% stenosed. Colon Flattery Cx to Prox Cx lesion is 30% stenosed. .  Mid Cx lesion is 85% stenosed. .  Mid RCA lesion is 100% stenosed.  1.  Significant calcified three-vessel coronary artery disease.  Chronically occluded mid right coronary artery with collaterals.  LAD disease is borderline. 2.  Left ventricular angiography was not performed.  EF was normal by echo. Right heart catheterization was performed due to presence of aortic stenosis.  It showed normal right and left-sided filling pressures, mild pulmonary hypertension and normal cardiac output. 3.  Moderate to severe aortic stenosis with peak to peak gradient of 42 mmHg and valve area of 1.00 cm.  Recommendations: Given the presence of calcified three-vessel coronary artery disease and moderate to severe aortic stenosis, we have to discuss management options with the heart team including CABG plus AVR versus PCI plus TAVR.  The LAD disease is not critical and RCA is already collateralized.  Options for PCI include treatment of the left circumflex and leaving everything else to be treated medically with plans for TAVR in the near future.  I will discuss with colleagues.      Neuro/Psych negative neurological ROS     GI/Hepatic Neg liver ROS,  GERD  ,  Endo/Other  negative endocrine ROS  Renal/GU negative Renal ROS     Musculoskeletal negative musculoskeletal ROS (+)   Abdominal   Peds  Hematology negative hematology ROS (+)   Anesthesia Other Findings   Reproductive/Obstetrics                            Anesthesia Physical Anesthesia Plan  ASA: 4  Anesthesia Plan: General   Post-op Pain Management:    Induction: Intravenous  PONV Risk Score and Plan: 4 or greater and Ondansetron, Dexamethasone and Midazolam  Airway Management Planned: Oral ETT  Additional Equipment: Arterial line, PA Cath, 3D TEE and Ultrasound Guidance Line Placement  Intra-op Plan:   Post-operative Plan: Post-operative intubation/ventilation  Informed Consent: I have reviewed the patients History and Physical, chart, labs and discussed the procedure including the risks, benefits and alternatives for the proposed anesthesia with the patient or authorized representative who has indicated his/her understanding and acceptance.     Dental advisory given  Plan Discussed with: Anesthesiologist and CRNA  Anesthesia Plan Comments:        Anesthesia Quick Evaluation

## 2020-11-10 NOTE — H&P (Signed)
[address]       [place] [address]             [phone]      Cardiothoracic Surgery Admission History and Physical  PCP is Hamrick, Lorin Mercy, MD Referring Provider is Kate Sable, MD       Chief Complaint  Patient presents with   Aortic Stenosis   Coronary Artery Disease            HPI:   The patient is a 75 year old woman with a history of hypertension, hyperlipidemia, and rheumatoid arthritis on methotrexate for the past few years who was evaluated for a heart murmur and exertional substernal chest discomfort.  A 2D echocardiogram on 05/06/2020 showed a thickened and calcified aortic valve with mean gradient of 28 mmHg and a peak gradient of 47 mmHg.  Dimensionless index was 0.27 consistent with moderate aortic stenosis.  Aortic valve area by VTI was 0.7 cm.  Stroke-volume was 54 cc.  Left ventricular ejection fraction was 70 to 75% with grade 1 diastolic dysfunction.  There is mild mitral valve regurgitation.  She subsequent underwent a nuclear stress test on 07/28/2020 showing mild inferolateral hypokinesis with an ejection fraction of 52%.  There was a moderate size region of ischemia noted in the inferolateral wall.  There were no electrocardiogram changes.  This was a high risk scan.  She underwent cardiac catheterization on 08/10/2020 showing significant three-vessel coronary disease.  The left circumflex had an 85% mid vessel stenosis compromising a large marginal branch.  The LAD has 60% proximal and 40% mid vessel stenosis.  The right coronary was occluded with filling of the distal vessel by collaterals.  A peak to peak gradient across aortic valve was 42 mmHg and the mean gradient was 25 mmHg with a valve area of 1 cm.   The patient reports exertional substernal chest discomfort and shortness of breath with exertion such as walking up steps or an incline.  She has had no symptoms at rest.  She did not think much about the symptoms prior to  being told that she had aortic stenosis and coronary disease.  She denies any dizziness or syncope.  She has had no peripheral edema.       Past Medical History:  Diagnosis Date   CAD (coronary artery disease)     Hyperlipidemia     Hypertension     Obesity             Past Surgical History:  Procedure Laterality Date   RIGHT/LEFT HEART CATH AND CORONARY ANGIOGRAPHY N/A 08/10/2020    Procedure: RIGHT/LEFT HEART CATH AND CORONARY ANGIOGRAPHY;  Surgeon: Wellington Hampshire, MD;  Location: [facility];  Service: Cardiovascular;  Laterality: N/A;           Family History  Problem Relation Age of Onset   Stroke Mother     Stroke Father     Heart attack Father        Social History Social History         Tobacco Use   Smoking status: Never      Passive exposure: Never   Smokeless tobacco: Never  Vaping Use   Vaping Use: Never used  Substance Use Topics   Alcohol use: Never   Drug use: Never            Current Outpatient Medications  Medication Sig Dispense Refill   aspirin EC 81 MG tablet  Take 81 mg by mouth in the morning. Swallow whole.       atorvastatin (LIPITOR) 80 MG tablet Take 1 tablet (80 mg total) by mouth daily. 90 tablet 1   augmented betamethasone dipropionate (DIPROLENE-AF) 0.05 % cream Apply 1 application topically 2 (two) times daily as needed (rash on legs).       Calcium Carb-Cholecalciferol (CALCIUM 600+D3 PO) Take 1 tablet by mouth in the morning.       Cholecalciferol (VITAMIN D3) 125 MCG (5000 UT) TABS Take 5,000 Units by mouth.       folic acid (FOLVITE) 1 MG tablet Take 1 mg by mouth in the morning.       hydroxychloroquine (PLAQUENIL) 200 MG tablet Take 200 mg by mouth in the morning.       lisinopril (ZESTRIL) 20 MG tablet Take 20 mg by mouth in the morning.       loratadine (CLARITIN) 10 MG tablet Take 10 mg by mouth in the morning.       methotrexate (RHEUMATREX) 2.5 MG tablet Take 10 mg by mouth every Monday.       nitroGLYCERIN  (NITROSTAT) 0.4 MG SL tablet Place 1 tablet (0.4 mg total) under the tongue every 5 (five) minutes x 3 doses as needed for chest pain. Up to 3 tabs daily maximum.       Omega-3 1000 MG CAPS Take 1,000 mg by mouth in the morning.       omeprazole (PRILOSEC) 20 MG capsule Take 20 mg by mouth in the morning.        No current facility-administered medications for this visit.          Allergies  Allergen Reactions   Penicillins Rash      Review of Systems  Constitutional:  Positive for activity change and fatigue. Negative for chills and fever.  HENT: Negative.         Last dental visit was 4-1/2 months ago.  Eyes: Negative.   Respiratory:  Positive for shortness of breath.   Cardiovascular:  Positive for chest pain. Negative for palpitations and leg swelling.  Gastrointestinal: Negative.   Endocrine: Negative.   Genitourinary: Negative.   Musculoskeletal:  Positive for arthralgias and joint swelling.       Rheumatoid arthritis primarily affecting her hands.  Skin: Negative.   Allergic/Immunologic: Negative.   Neurological:  Negative for dizziness and syncope.  Hematological: Negative.     BP (!) 170/100   Pulse 76   Resp 20   Ht 4\' 11"  (1.499 m)   Wt 150 lb (68 kg)   SpO2 98% Comment: RA  BMI 30.30 kg/m  Physical Exam Constitutional:      Appearance: Normal appearance.  HENT:     Head: Normocephalic and atraumatic.  Eyes:     Extraocular Movements: Extraocular movements intact.     Conjunctiva/sclera: Conjunctivae normal.     Pupils: Pupils are equal, round, and reactive to light.  Neck:     Comments: Transmitted murmur to both sides of the neck. Cardiovascular:     Rate and Rhythm: Normal rate and regular rhythm.     Pulses: Normal pulses.     Heart sounds: Murmur heard.     Comments: 3/6 systolic murmur along the right sternal border.  There is no diastolic murmur. Pulmonary:     Effort: Pulmonary effort is normal.     Breath sounds: Normal breath sounds.   Abdominal:     General: Bowel sounds are normal. There  is no distension.     Tenderness: There is no abdominal tenderness.  Musculoskeletal:        General: No swelling. Normal range of motion.     Cervical back: Normal range of motion and neck supple.  Skin:    General: Skin is warm and dry.  Neurological:     General: No focal deficit present.     Mental Status: She is alert and oriented to person, place, and time.  Psychiatric:        Mood and Affect: Mood normal.        Behavior: Behavior normal.        Diagnostic Tests:   ECHOCARDIOGRAM REPORT         Patient Name:   Shelley Berg Date of Exam: 05/06/2020  Medical Rec #:  [id number]         Height:  Accession #:    [id number]        Weight:  Date of Birth:  1945/04/13          BSA:  Patient Age:    89 years          BP:           168/83 mmHg  Patient Gender: F                 HR:           67 bpm.  Exam Location:  Outpatient   Procedure: 2D Echo, Color Doppler and Cardiac Doppler   Indications:    Murmur     History:        Patient has no prior history of Echocardiogram  examinations.     Sonographer:    Bernadene Person RDCS  Referring Phys: [id number]     1. Left ventricular ejection fraction, by estimation, is 70 to 75%. The  left ventricle has hyperdynamic function. Left ventricular diastolic  parameters are consistent with Grade I diastolic dysfunction (impaired  relaxation). Elevated left atrial  pressure.   2. Right ventricular systolic function is normal. The right ventricular  size is normal. There is normal pulmonary artery systolic pressure.   3. Left atrial size was mildly dilated.   4. Mild mitral valve regurgitation.   5. AV is difficult to see. It is thickened, calcified. Peak and mean  gradients through the valve are 47 and 28 mm Hg respectively Dimensionless  index is 0.3 consistent with moderate severe \ AS. Marland Kitchen The aortic valve was  not well visualized. Aortic valve   regurgitation is not visualized.   6. The inferior vena cava is normal in size with greater than 50%  respiratory variability, suggesting right atrial pressure of 3 mmHg.   7. Aortic dilatation noted. There is mild dilatation of the ascending  aorta, measuring 42 mm.   Conclusion(s)/Recommendation(s): Would recommend follow up echo in 6  months to reevaluate aortic valve.   FINDINGS   Left Ventricle: Left ventricular ejection fraction, by estimation, is 70  to 75%. The left ventricle has hyperdynamic function. The left ventricular  internal cavity size was normal in size. Left ventricular diastolic  parameters are consistent with Grade  I diastolic dysfunction (impaired relaxation). Elevated left atrial  pressure.   Right Ventricle: The right ventricular size is normal. Right vetricular  wall thickness was not assessed. Right ventricular systolic function is  normal. There is normal pulmonary artery systolic pressure. The tricuspid  regurgitant velocity is 1.81 m/s,  and with an assumed right atrial pressure of 3 mmHg, the estimated right  ventricular systolic pressure is 71.6 mmHg.   Left Atrium: Left atrial size was mildly dilated.   Right Atrium: Right atrial size was normal in size.   Pericardium: There is no evidence of pericardial effusion.   Mitral Valve: There is mild thickening of the mitral valve leaflet(s).  Mild mitral annular calcification. Mild mitral valve regurgitation.   Tricuspid Valve: The tricuspid valve is grossly normal. Tricuspid valve  regurgitation is trivial.   Aortic Valve: AV is difficult to see. It is thickened, calcified. Peak and  mean gradients through the valve are 47 and 28 mm Hg respectively  Dimensionless index is 0.3 consistent with moderately AS. The aortic valve  was not well visualized. Aortic valve  regurgitation is not visualized. Aortic valve mean gradient measures 27.6  mmHg. Aortic valve peak gradient measures 43.0 mmHg. Aortic  valve area, by  VTI measures 0.70 cm.   Pulmonic Valve: The pulmonic valve was normal in structure. Pulmonic valve  regurgitation is not visualized.   Aorta: The aortic root is normal in size and structure and aortic  dilatation noted. There is mild dilatation of the ascending aorta,  measuring 42 mm.   Venous: The inferior vena cava is normal in size with greater than 50%  respiratory variability, suggesting right atrial pressure of 3 mmHg.   IAS/Shunts: The interatrial septum was not assessed.      LEFT VENTRICLE  PLAX 2D  LVIDd:         3.50 cm  Diastology  LVIDs:         2.10 cm  LV e' medial:    4.22 cm/s  LV PW:         1.00 cm  LV E/e' medial:  21.4  LV IVS:        0.90 cm  LV e' lateral:   3.24 cm/s  LVOT diam:     1.80 cm  LV E/e' lateral: 27.8  LV SV:         54  LVOT Area:     2.54 cm      RIGHT VENTRICLE  RV S prime:     8.70 cm/s  TAPSE (M-mode): 1.2 cm   LEFT ATRIUM             RIGHT ATRIUM  LA diam:        3.50 cm RA Area:     7.01 cm  LA Vol (A2C):   20.3 ml RA Volume:   10.30 ml  LA Vol (A4C):   38.6 ml  LA Biplane Vol: 28.8 ml   AORTIC VALVE  AV Area (Vmax):    0.69 cm  AV Area (Vmean):   0.63 cm  AV Area (VTI):     0.70 cm  AV Vmax:           327.80 cm/s  AV Vmean:          248.600 cm/s  AV VTI:            0.770 m  AV Peak Grad:      43.0 mmHg  AV Mean Grad:      27.6 mmHg  LVOT Vmax:         89.40 cm/s  LVOT Vmean:        62.000 cm/s  LVOT VTI:          0.211 m  LVOT/AV VTI ratio: 0.27  AORTA  Ao Root diam: 3.20 cm  Ao Asc diam:  4.20 cm   MITRAL VALVE                TRICUSPID VALVE  MV Area (PHT): 3.27 cm     TR Peak grad:   13.1 mmHg  MV Decel Time: 232 msec     TR Vmax:        181.00 cm/s  MV E velocity: 90.20 cm/s  MV A velocity: 112.00 cm/s  SHUNTS  MV E/A ratio:  0.81         Systemic VTI:  0.21 m                              Systemic Diam: 1.80 cm   Dorris Carnes MD  Electronically signed by Dorris Carnes MD  Signature  Date/Time: 05/06/2020/6:06:30 PM       Physicians Panel Physicians Referring Physician Case Authorizing Physician  Wellington Hampshire, MD (Primary)      Procedures RIGHT/LEFT HEART CATH AND CORONARY ANGIOGRAPHY  Conclusion  Prox LAD lesion is 60% stenosed.   Mid LAD lesion is 40% stenosed.   Ost Cx to Prox Cx lesion is 30% stenosed.   Mid Cx lesion is 85% stenosed.   Mid RCA lesion is 100% stenosed.  1. Significant calcified three-vessel coronary artery disease. Chronically occluded mid right coronary artery with collaterals. LAD disease is borderline.  2. Left ventricular angiography was not performed. EF was normal by echo.  Right heart catheterization was performed due to presence of aortic stenosis. It showed normal right and left-sided filling pressures, mild pulmonary hypertension and normal cardiac output.  3. Moderate to severe aortic stenosis with peak to peak gradient of 42 mmHg and valve area of 1.00 cm.  Recommendations:  Given the presence of calcified three-vessel coronary artery disease and moderate to severe aortic stenosis, we have to discuss management options with the heart team including CABG plus AVR versus PCI plus TAVR. The LAD disease is not critical and RCA is already collateralized. Options for PCI include treatment of the left circumflex and leaving everything else to be treated medically with plans for TAVR in the near future. I will discuss with colleagues.  Procedural Details Technical Details Procedural Details: Using the modified Seldinger technique, a 5 French sheath was introduced into the right radial artery. 3 mg of verapamil was administered through the sheath, weight-based unfractionated heparin was administered intravenously. A Jackie catheter was used for selective coronary angiography and LV pressure with pullback. A JR4 catheter was needed to engage the right coronary artery. Catheter exchanges were performed over an exchange length guidewire. Due to  presence of aortic stenosis, I elected to perform right heart catheterization for better decision-making. The pre-existing IV in the right antecubital vein was exchanged under sterile fashion to a slender sheath. The right wrist was prepped, draped, and anesthetized with 1% lidocaine. Right heart catheterization was performed using a 5 French Swan-Ganz catheter. Cardiac output was calculated by the Fick method. There were no immediate procedural complications. A TR band was used for radial hemostasis at the completion of the procedure. The patient was transferred to the post catheterization recovery area for further monitoring.    Estimated blood loss <50 mL.   During this procedure medications were administered to achieve and maintain moderate conscious sedation while the patient's heart rate, blood pressure, and oxygen saturation were continuously monitored and I was present  face-to-face 100% of this time.  Medications (Filter: Administrations occurring from 1215 to 1327 on 08/10/20)  important Continuous medications are totaled by the amount administered until 08/10/20 1327.  Heparin (Porcine) in NaCl 1000-0.9 UT/500ML-% SOLN (mL) Total volume: 500 mL  Date/Time Rate/Dose/Volume Action     08/10/20 1226 500 mL Given    Heparin (Porcine) in NaCl 1000-0.9 UT/500ML-% SOLN (mL) Total volume: 500 mL  Date/Time Rate/Dose/Volume Action     08/10/20 1226 500 mL Given    midazolam (VERSED) injection (mg) Total dose: 1 mg  Date/Time Rate/Dose/Volume Action     08/10/20 1233 1 mg Given    fentaNYL (SUBLIMAZE) injection (mcg) Total dose: 25 mcg  Date/Time Rate/Dose/Volume Action     08/10/20 1233 25 mcg Given    lidocaine (PF) (XYLOCAINE) 1 % injection (mL) Total volume: 6 mL  Date/Time Rate/Dose/Volume Action     08/10/20 1242 3 mL Given    1302 3 mL Given    Radial Cocktail/Verapamil only (mL) Total volume: 10 mL  Date/Time Rate/Dose/Volume Action     08/10/20 1244 10 mL Given     heparin sodium (porcine) injection (Units) Total dose: 3,000 Units  Date/Time Rate/Dose/Volume Action     08/10/20 1248 3,000 Units Given    iohexol (OMNIPAQUE) 350 MG/ML injection (mL) Total volume: 60 mL  Date/Time Rate/Dose/Volume Action     08/10/20 1324 60 mL Given    Sedation Time Sedation Time Physician-1: 41 minutes 46 seconds  Contrast Medication Name Total Dose  iohexol (OMNIPAQUE) 350 MG/ML injection 60 mL  Radiation/Fluoro Fluoro time: 5.1 (min)  DAP: 45563 (mGycm2)  Cumulative Air Kerma: 944 (mGy)  Coronary Findings Diagnostic Dominance: Right  Left Main  Vessel is angiographically normal.    Left Anterior Descending  Prox LAD lesion is 60% stenosed. The lesion is severely calcified.  Mid LAD lesion is 40% stenosed. The lesion is moderately calcified.    Second Diagonal Branch  There is mild disease in the vessel.    Third Diagonal Branch  The vessel exhibits minimal luminal irregularities.    Left Circumflex  Ost Cx to Prox Cx lesion is 30% stenosed. The lesion is moderately calcified.  Mid Cx lesion is 85% stenosed. The lesion is type C. The lesion is moderately calcified.    Second Obtuse Marginal Branch  Vessel is small in size.    Right Coronary Artery  Mid RCA lesion is 100% stenosed.    Right Posterior Descending Artery  Collaterals  RPDA filled by collaterals from Dist LAD.       Right Posterior Atrioventricular Artery  Collaterals  RPAV filled by collaterals from RV Branch.     Intervention No interventions have been documented.  Coronary Diagrams Diagnostic Dominance: Right  &&&&&&  Intervention Implants  No implant documentation for this case.    Syngo Images Show images for CARDIAC CATHETERIZATION  Images on Long Term Storage Show images for Vanwart, Silva B Link to Procedure Log   Procedure Log  Hemo Data Flowsheet Row Most Recent Value  Fick Cardiac Output 4.68 L/min  Fick Cardiac Output Index 2.87 (L/min)/BSA   Aortic Mean Gradient 24.79 mmHg  Aortic Peak Gradient 42 mmHg  Aortic Valve Area 1.00  Aortic Value Area Index 0.61 cm2/BSA  RA A Wave 8 mmHg  RA V Wave 5 mmHg  RA Mean 4 mmHg  RV Systolic Pressure 39 mmHg  RV Diastolic Pressure 3 mmHg  RV EDP 6 mmHg  PA Systolic Pressure 37 mmHg  PA  Diastolic Pressure 12 mmHg  PA Mean 23 mmHg  PW A Wave 13 mmHg  PW V Wave 15 mmHg  PW Mean 11 mmHg  AO Systolic Pressure 213 mmHg  AO Diastolic Pressure 79 mmHg  AO Mean 086 mmHg  LV Systolic Pressure 578 mmHg  LV Diastolic Pressure 12 mmHg  LV EDP 18 mmHg  AOp Systolic Pressure 469 mmHg  AOp Diastolic Pressure 76 mmHg  AOp Mean Pressure 629 mmHg  LVp Systolic Pressure 528 mmHg  LVp Diastolic Pressure 12 mmHg  LVp EDP Pressure 18 mmHg  QP/QS 1  TPVR Index 8.03 HRUI  TSVR Index 38.73 HRUI  PVR SVR Ratio 0.11  TPVR/TSVR Ratio 0.21      Impression:   This 75 year old woman has stage D, moderate to severe symptomatic aortic stenosis and severe three-vessel coronary disease with New York Heart Association class II symptoms of exertional chest discomfort and shortness of breath consistent with chronic diastolic congestive heart failure and stable exertional angina.  I have personally reviewed her 2D echocardiogram and cardiac catheterization studies.  Her echo shows a calcified and thickened aortic valve with restricted leaflet mobility.  The mean gradient is 28 mmHg with a dimensionless index of 0.27 and a valve area of 0.7 cm consistent with moderate to severe aortic stenosis.  Left ventricular systolic function is normal.  Cardiac catheterization shows significant three-vessel coronary disease with the most significant lesion being 85% mid left circumflex stenosis compromising a large marginal branch.  There is also mid RCA occlusion with faint filling of the posterior descending branch by collaterals.  The LAD has moderate proximal to mid vessel disease that is probably not flow-limiting with a  relatively small distal vessel.  I agree that the best treatment for this patient is coronary bypass graft surgery and aortic valve replacement.  We discussed the alternatives of PCI of the left circumflex followed by TAVR if she remains symptomatic but her annulus is borderline for a 23 mm SAPIEN 3 valve and since she has a calcified bicuspid aortic valve I think she would be better off with open surgical aortic valve replacement.  Her gated cardiac CTA shows severely reduced cusp separation with severely thickened and calcified aortic valve cusps consistent with severe aortic stenosis.  I reviewed all of the above findings with her and my recommendation for open surgical AVR and CABG using a bioprosthetic valve.  I would probably leave the LAD alone since the degree of stenosis is only moderate, it is a relatively small vessel distally, and she is a relatively short woman who is likely to have a short, small LIMA that would probably not be ideal.   I discussed the operative procedure with the patient and her daughter including alternatives, benefits and risks; including but not limited to bleeding, blood transfusion, infection, stroke, myocardial infarction, graft failure, heart block requiring a permanent pacemaker, organ dysfunction, and death.  Rosalynn B Boeding understands and agrees to proceed.     Plan:   AVR using a bioprosthetic valve and CABG   Gaye Pollack, MD [facility] [phone]

## 2020-11-11 ENCOUNTER — Other Ambulatory Visit: Payer: Self-pay

## 2020-11-11 ENCOUNTER — Inpatient Hospital Stay (HOSPITAL_COMMUNITY): Payer: Medicare Other

## 2020-11-11 ENCOUNTER — Inpatient Hospital Stay (HOSPITAL_COMMUNITY)
Admission: RE | Admit: 2020-11-11 | Discharge: 2020-11-16 | DRG: 220 | Disposition: A | Discharge status: Home / Self Care | Payer: Medicare Other | Attending: Surgery | Admitting: Surgery

## 2020-11-11 ENCOUNTER — Encounter (HOSPITAL_COMMUNITY): Payer: Self-pay | Admitting: Surgery

## 2020-11-11 ENCOUNTER — Inpatient Hospital Stay (HOSPITAL_COMMUNITY): Payer: Medicare Other | Admitting: Anesthesiology

## 2020-11-11 ENCOUNTER — Inpatient Hospital Stay (HOSPITAL_COMMUNITY)
Admission: RE | Disposition: A | Discharge status: Home / Self Care | Payer: Self-pay | Source: Home / Self Care | Attending: Surgery

## 2020-11-11 ENCOUNTER — Inpatient Hospital Stay (HOSPITAL_COMMUNITY): Payer: Medicare Other | Admitting: Physician Assistant

## 2020-11-11 DIAGNOSIS — I517 Cardiomegaly: Secondary | ICD-10-CM | POA: Diagnosis not present

## 2020-11-11 DIAGNOSIS — I2584 Coronary atherosclerosis due to calcified coronary lesion: Secondary | ICD-10-CM | POA: Diagnosis present

## 2020-11-11 DIAGNOSIS — J9811 Atelectasis: Secondary | ICD-10-CM | POA: Diagnosis not present

## 2020-11-11 DIAGNOSIS — M069 Rheumatoid arthritis, unspecified: Secondary | ICD-10-CM | POA: Diagnosis present

## 2020-11-11 DIAGNOSIS — I2510 Atherosclerotic heart disease of native coronary artery without angina pectoris: Secondary | ICD-10-CM

## 2020-11-11 DIAGNOSIS — Z880 Allergy status to penicillin: Secondary | ICD-10-CM | POA: Diagnosis not present

## 2020-11-11 DIAGNOSIS — Z952 Presence of prosthetic heart valve: Secondary | ICD-10-CM | POA: Diagnosis not present

## 2020-11-11 DIAGNOSIS — Z20822 Contact with and (suspected) exposure to covid-19: Secondary | ICD-10-CM | POA: Diagnosis not present

## 2020-11-11 DIAGNOSIS — I083 Combined rheumatic disorders of mitral, aortic and tricuspid valves: Secondary | ICD-10-CM | POA: Diagnosis not present

## 2020-11-11 DIAGNOSIS — Z006 Encounter for examination for normal comparison and control in clinical research program: Secondary | ICD-10-CM

## 2020-11-11 DIAGNOSIS — E8770 Fluid overload, unspecified: Secondary | ICD-10-CM | POA: Diagnosis present

## 2020-11-11 DIAGNOSIS — I10 Essential (primary) hypertension: Secondary | ICD-10-CM | POA: Diagnosis not present

## 2020-11-11 DIAGNOSIS — E669 Obesity, unspecified: Secondary | ICD-10-CM | POA: Diagnosis present

## 2020-11-11 DIAGNOSIS — R0602 Shortness of breath: Secondary | ICD-10-CM | POA: Diagnosis present

## 2020-11-11 DIAGNOSIS — J90 Pleural effusion, not elsewhere classified: Secondary | ICD-10-CM

## 2020-11-11 DIAGNOSIS — Z7982 Long term (current) use of aspirin: Secondary | ICD-10-CM

## 2020-11-11 DIAGNOSIS — K8010 Calculus of gallbladder with chronic cholecystitis without obstruction: Secondary | ICD-10-CM | POA: Diagnosis not present

## 2020-11-11 DIAGNOSIS — Z823 Family history of stroke: Secondary | ICD-10-CM

## 2020-11-11 DIAGNOSIS — I25118 Atherosclerotic heart disease of native coronary artery with other forms of angina pectoris: Secondary | ICD-10-CM | POA: Diagnosis not present

## 2020-11-11 DIAGNOSIS — I272 Pulmonary hypertension, unspecified: Secondary | ICD-10-CM | POA: Diagnosis not present

## 2020-11-11 DIAGNOSIS — Z8249 Family history of ischemic heart disease and other diseases of the circulatory system: Secondary | ICD-10-CM

## 2020-11-11 DIAGNOSIS — I119 Hypertensive heart disease without heart failure: Secondary | ICD-10-CM | POA: Diagnosis not present

## 2020-11-11 DIAGNOSIS — Z6831 Body mass index (BMI) 31.0-31.9, adult: Secondary | ICD-10-CM

## 2020-11-11 DIAGNOSIS — Z79899 Other long term (current) drug therapy: Secondary | ICD-10-CM | POA: Diagnosis not present

## 2020-11-11 DIAGNOSIS — I77810 Thoracic aortic ectasia: Secondary | ICD-10-CM | POA: Diagnosis present

## 2020-11-11 DIAGNOSIS — E785 Hyperlipidemia, unspecified: Secondary | ICD-10-CM | POA: Diagnosis present

## 2020-11-11 DIAGNOSIS — Z951 Presence of aortocoronary bypass graft: Secondary | ICD-10-CM

## 2020-11-11 DIAGNOSIS — D62 Acute posthemorrhagic anemia: Secondary | ICD-10-CM | POA: Diagnosis not present

## 2020-11-11 DIAGNOSIS — R079 Chest pain, unspecified: Secondary | ICD-10-CM | POA: Diagnosis not present

## 2020-11-11 DIAGNOSIS — Z79631 Long term (current) use of antimetabolite agent: Secondary | ICD-10-CM | POA: Diagnosis not present

## 2020-11-11 DIAGNOSIS — Z4682 Encounter for fitting and adjustment of non-vascular catheter: Secondary | ICD-10-CM | POA: Diagnosis not present

## 2020-11-11 DIAGNOSIS — I350 Nonrheumatic aortic (valve) stenosis: Secondary | ICD-10-CM | POA: Diagnosis present

## 2020-11-11 HISTORY — PX: ENDOVEIN HARVEST OF GREATER SAPHENOUS VEIN: SHX5059

## 2020-11-11 HISTORY — PX: AORTIC VALVE REPLACEMENT: SHX41

## 2020-11-11 HISTORY — PX: TEE WITHOUT CARDIOVERSION: SHX5443

## 2020-11-11 HISTORY — DX: Unspecified osteoarthritis, unspecified site: M19.90

## 2020-11-11 HISTORY — PX: CORONARY ARTERY BYPASS GRAFT: SHX141

## 2020-11-11 HISTORY — DX: Gastro-esophageal reflux disease without esophagitis: K21.9

## 2020-11-11 LAB — POCT I-STAT, CHEM 8
BUN: 9 mg/dL (ref 8–23)
BUN: 8 mg/dL (ref 8–23)
BUN: 7 mg/dL — ABNORMAL LOW (ref 8–23)
BUN: 7 mg/dL — ABNORMAL LOW (ref 8–23)
BUN: 6 mg/dL — ABNORMAL LOW (ref 8–23)
Calcium, Ion: 1.26 mmol/L (ref 1.15–1.40)
Calcium, Ion: 1.26 mmol/L (ref 1.15–1.40)
Calcium, Ion: 0.9 mmol/L — ABNORMAL LOW (ref 1.15–1.40)
Calcium, Ion: 1.01 mmol/L — ABNORMAL LOW (ref 1.15–1.40)
Calcium, Ion: 1.23 mmol/L (ref 1.15–1.40)
Chloride: 107 mmol/L (ref 98–111)
Chloride: 107 mmol/L (ref 98–111)
Chloride: 104 mmol/L (ref 98–111)
Chloride: 104 mmol/L (ref 98–111)
Chloride: 108 mmol/L (ref 98–111)
Creatinine, Ser: 0.4 mg/dL — ABNORMAL LOW (ref 0.44–1.00)
Creatinine, Ser: 0.5 mg/dL (ref 0.44–1.00)
Creatinine, Ser: 0.3 mg/dL — ABNORMAL LOW (ref 0.44–1.00)
Creatinine, Ser: 0.4 mg/dL — ABNORMAL LOW (ref 0.44–1.00)
Creatinine, Ser: 0.3 mg/dL — ABNORMAL LOW (ref 0.44–1.00)
Glucose, Bld: 138 mg/dL — ABNORMAL HIGH (ref 70–99)
Glucose, Bld: 131 mg/dL — ABNORMAL HIGH (ref 70–99)
Glucose, Bld: 132 mg/dL — ABNORMAL HIGH (ref 70–99)
Glucose, Bld: 135 mg/dL — ABNORMAL HIGH (ref 70–99)
Glucose, Bld: 147 mg/dL — ABNORMAL HIGH (ref 70–99)
HCT: 36 % (ref 36.0–46.0)
HCT: 32 % — ABNORMAL LOW (ref 36.0–46.0)
HCT: 25 % — ABNORMAL LOW (ref 36.0–46.0)
HCT: 26 % — ABNORMAL LOW (ref 36.0–46.0)
HCT: 20 % — ABNORMAL LOW (ref 36.0–46.0)
Hemoglobin: 12.2 g/dL (ref 12.0–15.0)
Hemoglobin: 10.9 g/dL — ABNORMAL LOW (ref 12.0–15.0)
Hemoglobin: 8.5 g/dL — ABNORMAL LOW (ref 12.0–15.0)
Hemoglobin: 8.8 g/dL — ABNORMAL LOW (ref 12.0–15.0)
Hemoglobin: 6.8 g/dL — CL (ref 12.0–15.0)
Potassium: 4.1 mmol/L (ref 3.5–5.1)
Potassium: 3.9 mmol/L (ref 3.5–5.1)
Potassium: 4.6 mmol/L (ref 3.5–5.1)
Potassium: 4.7 mmol/L (ref 3.5–5.1)
Potassium: 4.3 mmol/L (ref 3.5–5.1)
Sodium: 142 mmol/L (ref 135–145)
Sodium: 142 mmol/L (ref 135–145)
Sodium: 138 mmol/L (ref 135–145)
Sodium: 141 mmol/L (ref 135–145)
Sodium: 142 mmol/L (ref 135–145)
TCO2: 21 mmol/L — ABNORMAL LOW (ref 22–32)
TCO2: 25 mmol/L (ref 22–32)
TCO2: 25 mmol/L (ref 22–32)
TCO2: 25 mmol/L (ref 22–32)
TCO2: 23 mmol/L (ref 22–32)

## 2020-11-11 LAB — POCT I-STAT 7, (LYTES, BLD GAS, ICA,H+H)
Acid-Base Excess: 1 mmol/L (ref 0.0–2.0)
Acid-Base Excess: 0 mmol/L (ref 0.0–2.0)
Acid-Base Excess: 0 mmol/L (ref 0.0–2.0)
Acid-base deficit: 2 mmol/L (ref 0.0–2.0)
Acid-base deficit: 1 mmol/L (ref 0.0–2.0)
Acid-base deficit: 5 mmol/L — ABNORMAL HIGH (ref 0.0–2.0)
Acid-base deficit: 3 mmol/L — ABNORMAL HIGH (ref 0.0–2.0)
Acid-base deficit: 6 mmol/L — ABNORMAL HIGH (ref 0.0–2.0)
Bicarbonate: 22.6 mmol/L (ref 20.0–28.0)
Bicarbonate: 25.8 mmol/L (ref 20.0–28.0)
Bicarbonate: 24.1 mmol/L (ref 20.0–28.0)
Bicarbonate: 24.7 mmol/L (ref 20.0–28.0)
Bicarbonate: 22.8 mmol/L (ref 20.0–28.0)
Bicarbonate: 21 mmol/L (ref 20.0–28.0)
Bicarbonate: 22.3 mmol/L (ref 20.0–28.0)
Bicarbonate: 19.1 mmol/L — ABNORMAL LOW (ref 20.0–28.0)
Calcium, Ion: 1.25 mmol/L (ref 1.15–1.40)
Calcium, Ion: 0.93 mmol/L — ABNORMAL LOW (ref 1.15–1.40)
Calcium, Ion: 0.9 mmol/L — ABNORMAL LOW (ref 1.15–1.40)
Calcium, Ion: 0.86 mmol/L — CL (ref 1.15–1.40)
Calcium, Ion: 1.22 mmol/L (ref 1.15–1.40)
Calcium, Ion: 1.19 mmol/L (ref 1.15–1.40)
Calcium, Ion: 1.17 mmol/L (ref 1.15–1.40)
Calcium, Ion: 1.15 mmol/L (ref 1.15–1.40)
HCT: 34 % — ABNORMAL LOW (ref 36.0–46.0)
HCT: 25 % — ABNORMAL LOW (ref 36.0–46.0)
HCT: 24 % — ABNORMAL LOW (ref 36.0–46.0)
HCT: 22 % — ABNORMAL LOW (ref 36.0–46.0)
HCT: 22 % — ABNORMAL LOW (ref 36.0–46.0)
HCT: 27 % — ABNORMAL LOW (ref 36.0–46.0)
HCT: 27 % — ABNORMAL LOW (ref 36.0–46.0)
HCT: 25 % — ABNORMAL LOW (ref 36.0–46.0)
Hemoglobin: 11.6 g/dL — ABNORMAL LOW (ref 12.0–15.0)
Hemoglobin: 8.5 g/dL — ABNORMAL LOW (ref 12.0–15.0)
Hemoglobin: 8.2 g/dL — ABNORMAL LOW (ref 12.0–15.0)
Hemoglobin: 7.5 g/dL — ABNORMAL LOW (ref 12.0–15.0)
Hemoglobin: 7.5 g/dL — ABNORMAL LOW (ref 12.0–15.0)
Hemoglobin: 9.2 g/dL — ABNORMAL LOW (ref 12.0–15.0)
Hemoglobin: 9.2 g/dL — ABNORMAL LOW (ref 12.0–15.0)
Hemoglobin: 8.5 g/dL — ABNORMAL LOW (ref 12.0–15.0)
O2 Saturation: 100 %
O2 Saturation: 100 %
O2 Saturation: 100 %
O2 Saturation: 100 %
O2 Saturation: 100 %
O2 Saturation: 94 %
O2 Saturation: 98 %
O2 Saturation: 98 %
Patient temperature: 35.7
Patient temperature: 36.4
Patient temperature: 36.7
Potassium: 4.1 mmol/L (ref 3.5–5.1)
Potassium: 4.8 mmol/L (ref 3.5–5.1)
Potassium: 4.5 mmol/L (ref 3.5–5.1)
Potassium: 5.2 mmol/L — ABNORMAL HIGH (ref 3.5–5.1)
Potassium: 4.3 mmol/L (ref 3.5–5.1)
Potassium: 4.3 mmol/L (ref 3.5–5.1)
Potassium: 5.5 mmol/L — ABNORMAL HIGH (ref 3.5–5.1)
Potassium: 4.3 mmol/L (ref 3.5–5.1)
Sodium: 142 mmol/L (ref 135–145)
Sodium: 143 mmol/L (ref 135–145)
Sodium: 141 mmol/L (ref 135–145)
Sodium: 141 mmol/L (ref 135–145)
Sodium: 142 mmol/L (ref 135–145)
Sodium: 143 mmol/L (ref 135–145)
Sodium: 140 mmol/L (ref 135–145)
Sodium: 143 mmol/L (ref 135–145)
TCO2: 24 mmol/L (ref 22–32)
TCO2: 27 mmol/L (ref 22–32)
TCO2: 25 mmol/L (ref 22–32)
TCO2: 26 mmol/L (ref 22–32)
TCO2: 24 mmol/L (ref 22–32)
TCO2: 22 mmol/L (ref 22–32)
TCO2: 23 mmol/L (ref 22–32)
TCO2: 20 mmol/L — ABNORMAL LOW (ref 22–32)
pCO2 arterial: 38.7 mmHg (ref 32.0–48.0)
pCO2 arterial: 39.5 mmHg (ref 32.0–48.0)
pCO2 arterial: 40.6 mmHg (ref 32.0–48.0)
pCO2 arterial: 41.1 mmHg (ref 32.0–48.0)
pCO2 arterial: 30.2 mmHg — ABNORMAL LOW (ref 32.0–48.0)
pCO2 arterial: 39.6 mmHg (ref 32.0–48.0)
pCO2 arterial: 39.4 mmHg (ref 32.0–48.0)
pCO2 arterial: 36.7 mmHg (ref 32.0–48.0)
pH, Arterial: 7.375 (ref 7.350–7.450)
pH, Arterial: 7.424 (ref 7.350–7.450)
pH, Arterial: 7.381 (ref 7.350–7.450)
pH, Arterial: 7.387 (ref 7.350–7.450)
pH, Arterial: 7.487 — ABNORMAL HIGH (ref 7.350–7.450)
pH, Arterial: 7.327 — ABNORMAL LOW (ref 7.350–7.450)
pH, Arterial: 7.358 (ref 7.350–7.450)
pH, Arterial: 7.323 — ABNORMAL LOW (ref 7.350–7.450)
pO2, Arterial: 233 mmHg — ABNORMAL HIGH (ref 83.0–108.0)
pO2, Arterial: 413 mmHg — ABNORMAL HIGH (ref 83.0–108.0)
pO2, Arterial: 416 mmHg — ABNORMAL HIGH (ref 83.0–108.0)
pO2, Arterial: 345 mmHg — ABNORMAL HIGH (ref 83.0–108.0)
pO2, Arterial: 296 mmHg — ABNORMAL HIGH (ref 83.0–108.0)
pO2, Arterial: 70 mmHg — ABNORMAL LOW (ref 83.0–108.0)
pO2, Arterial: 115 mmHg — ABNORMAL HIGH (ref 83.0–108.0)
pO2, Arterial: 120 mmHg — ABNORMAL HIGH (ref 83.0–108.0)

## 2020-11-11 LAB — CBC
HCT: 29.4 % — ABNORMAL LOW (ref 36.0–46.0)
HCT: 30 % — ABNORMAL LOW (ref 36.0–46.0)
Hemoglobin: 9.9 g/dL — ABNORMAL LOW (ref 12.0–15.0)
Hemoglobin: 10.2 g/dL — ABNORMAL LOW (ref 12.0–15.0)
MCH: 31.1 pg (ref 26.0–34.0)
MCH: 31 pg (ref 26.0–34.0)
MCHC: 33.7 g/dL (ref 30.0–36.0)
MCHC: 34 g/dL (ref 30.0–36.0)
MCV: 92.5 fL (ref 80.0–100.0)
MCV: 91.2 fL (ref 80.0–100.0)
Platelets: 163 10*3/uL (ref 150–400)
Platelets: 184 10*3/uL (ref 150–400)
RBC: 3.18 MIL/uL — ABNORMAL LOW (ref 3.87–5.11)
RBC: 3.29 MIL/uL — ABNORMAL LOW (ref 3.87–5.11)
RDW: 12.7 % (ref 11.5–15.5)
RDW: 13 % (ref 11.5–15.5)
WBC: 17.2 10*3/uL — ABNORMAL HIGH (ref 4.0–10.5)
WBC: 18.5 10*3/uL — ABNORMAL HIGH (ref 4.0–10.5)
nRBC: 0 % (ref 0.0–0.2)
nRBC: 0 % (ref 0.0–0.2)

## 2020-11-11 LAB — GLUCOSE, CAPILLARY
Glucose-Capillary: 125 mg/dL — ABNORMAL HIGH (ref 70–99)
Glucose-Capillary: 151 mg/dL — ABNORMAL HIGH (ref 70–99)
Glucose-Capillary: 164 mg/dL — ABNORMAL HIGH (ref 70–99)
Glucose-Capillary: 140 mg/dL — ABNORMAL HIGH (ref 70–99)
Glucose-Capillary: 155 mg/dL — ABNORMAL HIGH (ref 70–99)
Glucose-Capillary: 134 mg/dL — ABNORMAL HIGH (ref 70–99)
Glucose-Capillary: 139 mg/dL — ABNORMAL HIGH (ref 70–99)
Glucose-Capillary: 127 mg/dL — ABNORMAL HIGH (ref 70–99)
Glucose-Capillary: 95 mg/dL (ref 70–99)
Glucose-Capillary: 188 mg/dL — ABNORMAL HIGH (ref 70–99)
Glucose-Capillary: 154 mg/dL — ABNORMAL HIGH (ref 70–99)

## 2020-11-11 LAB — BASIC METABOLIC PANEL
Anion gap: 7 (ref 5–15)
BUN: 9 mg/dL (ref 8–23)
CO2: 20 mmol/L — ABNORMAL LOW (ref 22–32)
Calcium: 7.5 mg/dL — ABNORMAL LOW (ref 8.9–10.3)
Chloride: 111 mmol/L (ref 98–111)
Creatinine, Ser: 0.54 mg/dL (ref 0.44–1.00)
GFR, Estimated: >60 mL/min (ref >60)
Glucose, Bld: 147 mg/dL — ABNORMAL HIGH (ref 70–99)
Potassium: 5 mmol/L (ref 3.5–5.1)
Sodium: 138 mmol/L (ref 135–145)

## 2020-11-11 LAB — POCT I-STAT EG7
Acid-Base Excess: 0 mmol/L (ref 0.0–2.0)
Bicarbonate: 25 mmol/L (ref 20.0–28.0)
Calcium, Ion: 1 mmol/L — ABNORMAL LOW (ref 1.15–1.40)
HCT: 24 % — ABNORMAL LOW (ref 36.0–46.0)
Hemoglobin: 8.2 g/dL — ABNORMAL LOW (ref 12.0–15.0)
O2 Saturation: 79 %
Potassium: 4 mmol/L (ref 3.5–5.1)
Sodium: 144 mmol/L (ref 135–145)
TCO2: 26 mmol/L (ref 22–32)
pCO2, Ven: 44.2 mmHg (ref 44.0–60.0)
pH, Ven: 7.361 (ref 7.250–7.430)
pO2, Ven: 46 mmHg — ABNORMAL HIGH (ref 32.0–45.0)

## 2020-11-11 LAB — HEMOGLOBIN AND HEMATOCRIT, BLOOD
HCT: 25.8 % — ABNORMAL LOW (ref 36.0–46.0)
Hemoglobin: 8.9 g/dL — ABNORMAL LOW (ref 12.0–15.0)

## 2020-11-11 LAB — PROTIME-INR
INR: 1.6 — ABNORMAL HIGH (ref 0.8–1.2)
Prothrombin Time: 18.7 seconds — ABNORMAL HIGH (ref 11.4–15.2)

## 2020-11-11 LAB — ABO/RH: ABO/RH(D): A POS

## 2020-11-11 LAB — PLATELET COUNT: Platelets: 184 10*3/uL (ref 150–400)

## 2020-11-11 LAB — APTT: aPTT: 37 seconds — ABNORMAL HIGH (ref 24–36)

## 2020-11-11 LAB — MAGNESIUM: Magnesium: 3.2 mg/dL — ABNORMAL HIGH (ref 1.7–2.4)

## 2020-11-11 SURGERY — CORONARY ARTERY BYPASS GRAFTING (CABG)
Anesthesia: General | Site: Chest | Laterality: Right

## 2020-11-11 MED ORDER — CHLORHEXIDINE GLUCONATE 4 % EX LIQD: 30.0000 mL | CUTANEOUS | Status: DC

## 2020-11-11 MED ORDER — 0.9 % SODIUM CHLORIDE (POUR BTL) OPTIME
TOPICAL | Status: DC | PRN
  Administered 2020-11-11: 5000 mL

## 2020-11-11 MED ORDER — ORAL CARE MOUTH RINSE
15.0000 mL | Freq: Two times a day (BID) | OROMUCOSAL | Status: DC
  Administered 2020-11-11 – 2020-11-14 (×7): 15 mL via OROMUCOSAL

## 2020-11-11 MED ORDER — SODIUM CHLORIDE 0.9% FLUSH
3.0000 mL | Freq: Two times a day (BID) | INTRAVENOUS | Status: DC
  Administered 2020-11-12 – 2020-11-13 (×4): 3 mL via INTRAVENOUS

## 2020-11-11 MED ORDER — NITROGLYCERIN IN D5W 200-5 MCG/ML-% IV SOLN
0.0000 ug/min | INTRAVENOUS | Status: DC
  Administered 2020-11-11: 0 ug/min via INTRAVENOUS

## 2020-11-11 MED ORDER — LACTATED RINGERS IV SOLN: INTRAVENOUS | Status: DC | PRN

## 2020-11-11 MED ORDER — CHLORHEXIDINE GLUCONATE 0.12% ORAL RINSE (MEDLINE KIT): 15.0000 mL | Freq: Two times a day (BID) | OROMUCOSAL | Status: DC

## 2020-11-11 MED ORDER — FENTANYL CITRATE (PF) 250 MCG/5ML IJ SOLN
INTRAMUSCULAR | Status: AC
  Filled 2020-11-11: qty 25

## 2020-11-11 MED ORDER — LACTATED RINGERS IV SOLN: INTRAVENOUS | Status: DC

## 2020-11-11 MED ORDER — THROMBIN (RECOMBINANT) 20000 UNITS EX SOLR
CUTANEOUS | Status: AC
  Filled 2020-11-11: qty 20000

## 2020-11-11 MED ORDER — ONDANSETRON HCL 4 MG/2ML IJ SOLN
4.0000 mg | Freq: Four times a day (QID) | INTRAMUSCULAR | Status: DC | PRN
  Administered 2020-11-11: 4 mg via INTRAVENOUS
  Filled 2020-11-11: qty 2

## 2020-11-11 MED ORDER — LIDOCAINE 2% (20 MG/ML) 5 ML SYRINGE
INTRAMUSCULAR | Status: AC
  Filled 2020-11-11: qty 5

## 2020-11-11 MED ORDER — PHENYLEPHRINE 40 MCG/ML (10ML) SYRINGE FOR IV PUSH (FOR BLOOD PRESSURE SUPPORT)
PREFILLED_SYRINGE | INTRAVENOUS | Status: DC | PRN
  Administered 2020-11-11 (×2): 80 ug via INTRAVENOUS

## 2020-11-11 MED ORDER — ORAL CARE MOUTH RINSE
15.0000 mL | OROMUCOSAL | Status: DC
  Administered 2020-11-11 (×2): 15 mL via OROMUCOSAL

## 2020-11-11 MED ORDER — THROMBIN 20000 UNITS EX SOLR
CUTANEOUS | Status: DC | PRN
  Administered 2020-11-11: 20000 [IU] via TOPICAL

## 2020-11-11 MED ORDER — SODIUM CHLORIDE (PF) 0.9 % IJ SOLN
INTRAMUSCULAR | Status: AC
  Filled 2020-11-11: qty 20

## 2020-11-11 MED ORDER — PROTAMINE SULFATE 10 MG/ML IV SOLN
INTRAVENOUS | Status: DC | PRN
  Administered 2020-11-11: 240 mg via INTRAVENOUS

## 2020-11-11 MED ORDER — VANCOMYCIN HCL IN DEXTROSE 1-5 GM/200ML-% IV SOLN
1000.0000 mg | Freq: Once | INTRAVENOUS | Status: AC
  Administered 2020-11-11: 1000 mg via INTRAVENOUS
  Filled 2020-11-11: qty 200

## 2020-11-11 MED ORDER — POTASSIUM CHLORIDE 10 MEQ/50ML IV SOLN
10.0000 meq | INTRAVENOUS | Status: AC
  Administered 2020-11-11 (×3): 10 meq via INTRAVENOUS

## 2020-11-11 MED ORDER — ~~LOC~~ CARDIAC SURGERY, PATIENT & FAMILY EDUCATION
Freq: Once | Status: DC
  Filled 2020-11-11: qty 1

## 2020-11-11 MED ORDER — ASPIRIN EC 325 MG PO TBEC
325.0000 mg | DELAYED_RELEASE_TABLET | Freq: Every day | ORAL | Status: DC
  Administered 2020-11-13: 325 mg via ORAL
  Filled 2020-11-11: qty 1

## 2020-11-11 MED ORDER — LACTATED RINGERS IV SOLN: 500.0000 mL | Freq: Once | INTRAVENOUS | Status: DC | PRN

## 2020-11-11 MED ORDER — MIDAZOLAM HCL (PF) 5 MG/ML IJ SOLN
INTRAMUSCULAR | Status: DC | PRN
  Administered 2020-11-11 (×3): 1 mg via INTRAVENOUS
  Administered 2020-11-11: 2 mg via INTRAVENOUS

## 2020-11-11 MED ORDER — PROPOFOL 10 MG/ML IV BOLUS
INTRAVENOUS | Status: DC | PRN
  Administered 2020-11-11: 80 mg via INTRAVENOUS

## 2020-11-11 MED ORDER — INSULIN REGULAR(HUMAN) IN NACL 100-0.9 UT/100ML-% IV SOLN: INTRAVENOUS | Status: DC

## 2020-11-11 MED ORDER — BISACODYL 5 MG PO TBEC
10.0000 mg | DELAYED_RELEASE_TABLET | Freq: Every day | ORAL | Status: DC
  Administered 2020-11-12 – 2020-11-13 (×2): 10 mg via ORAL
  Filled 2020-11-11 (×2): qty 2

## 2020-11-11 MED ORDER — MORPHINE SULFATE (PF) 2 MG/ML IV SOLN: 1.0000 mg | INTRAVENOUS | Status: DC | PRN

## 2020-11-11 MED ORDER — FOLIC ACID 1 MG PO TABS
1.0000 mg | ORAL_TABLET | Freq: Every morning | ORAL | Status: DC
  Administered 2020-11-12 – 2020-11-16 (×5): 1 mg via ORAL
  Filled 2020-11-11 (×5): qty 1

## 2020-11-11 MED ORDER — HEMOSTATIC AGENTS (NO CHARGE) OPTIME
TOPICAL | Status: DC | PRN
  Administered 2020-11-11: 1 via TOPICAL

## 2020-11-11 MED ORDER — PANTOPRAZOLE SODIUM 40 MG PO TBEC
40.0000 mg | DELAYED_RELEASE_TABLET | Freq: Every day | ORAL | Status: DC
  Administered 2020-11-13: 40 mg via ORAL
  Filled 2020-11-11: qty 1

## 2020-11-11 MED ORDER — DEXTROSE 50 % IV SOLN: 0.0000 mL | INTRAVENOUS | Status: DC | PRN

## 2020-11-11 MED ORDER — OXYCODONE HCL 5 MG PO TABS: 5.0000 mg | ORAL_TABLET | ORAL | Status: DC | PRN

## 2020-11-11 MED ORDER — MIDAZOLAM HCL 2 MG/2ML IJ SOLN: 2.0000 mg | INTRAMUSCULAR | Status: DC | PRN

## 2020-11-11 MED ORDER — CHLORHEXIDINE GLUCONATE 0.12 % MT SOLN: 15.0000 mL | Freq: Once | OROMUCOSAL | Status: AC

## 2020-11-11 MED ORDER — SODIUM CHLORIDE 0.9% FLUSH
3.0000 mL | INTRAVENOUS | Status: DC | PRN
  Administered 2020-11-12: 3 mL via INTRAVENOUS

## 2020-11-11 MED ORDER — PANTOPRAZOLE SODIUM 40 MG PO TBEC: 40.0000 mg | DELAYED_RELEASE_TABLET | Freq: Every day | ORAL | Status: DC

## 2020-11-11 MED ORDER — CHLORHEXIDINE GLUCONATE 0.12 % MT SOLN
OROMUCOSAL | Status: AC
  Administered 2020-11-11: 15 mL via OROMUCOSAL
  Filled 2020-11-11: qty 15

## 2020-11-11 MED ORDER — ROCURONIUM BROMIDE 10 MG/ML (PF) SYRINGE
PREFILLED_SYRINGE | INTRAVENOUS | Status: AC
  Filled 2020-11-11: qty 10

## 2020-11-11 MED ORDER — THROMBIN 20000 UNITS EX SOLR: OROMUCOSAL | Status: DC | PRN

## 2020-11-11 MED ORDER — HEPARIN SODIUM (PORCINE) 1000 UNIT/ML IJ SOLN
INTRAMUSCULAR | Status: AC
  Filled 2020-11-11: qty 1

## 2020-11-11 MED ORDER — PROPOFOL 10 MG/ML IV BOLUS
INTRAVENOUS | Status: AC
  Filled 2020-11-11: qty 20

## 2020-11-11 MED ORDER — HEPARIN SODIUM (PORCINE) 1000 UNIT/ML IJ SOLN
INTRAMUSCULAR | Status: DC | PRN
  Administered 2020-11-11: 24000 [IU] via INTRAVENOUS

## 2020-11-11 MED ORDER — LIDOCAINE 2% (20 MG/ML) 5 ML SYRINGE
INTRAMUSCULAR | Status: DC | PRN
  Administered 2020-11-11: 100 mg via INTRAVENOUS

## 2020-11-11 MED ORDER — METOPROLOL TARTRATE 12.5 MG HALF TABLET
ORAL_TABLET | ORAL | Status: AC
  Administered 2020-11-11: 12.5 mg via ORAL
  Filled 2020-11-11: qty 1

## 2020-11-11 MED ORDER — FENTANYL CITRATE (PF) 250 MCG/5ML IJ SOLN
INTRAMUSCULAR | Status: DC | PRN
  Administered 2020-11-11: 50 ug via INTRAVENOUS
  Administered 2020-11-11: 100 ug via INTRAVENOUS
  Administered 2020-11-11: 150 ug via INTRAVENOUS
  Administered 2020-11-11: 200 ug via INTRAVENOUS
  Administered 2020-11-11: 100 ug via INTRAVENOUS
  Administered 2020-11-11: 250 ug via INTRAVENOUS
  Administered 2020-11-11: 100 ug via INTRAVENOUS
  Administered 2020-11-11: 50 ug via INTRAVENOUS

## 2020-11-11 MED ORDER — ACETAMINOPHEN 160 MG/5ML PO SOLN: 650.0000 mg | Freq: Once | ORAL | Status: AC

## 2020-11-11 MED ORDER — PHENYLEPHRINE HCL-NACL 20-0.9 MG/250ML-% IV SOLN
0.0000 ug/min | INTRAVENOUS | Status: DC
  Administered 2020-11-12: 30 ug/min via INTRAVENOUS
  Filled 2020-11-11: qty 250

## 2020-11-11 MED ORDER — ROCURONIUM BROMIDE 10 MG/ML (PF) SYRINGE
PREFILLED_SYRINGE | INTRAVENOUS | Status: DC | PRN
  Administered 2020-11-11: 30 mg via INTRAVENOUS
  Administered 2020-11-11: 50 mg via INTRAVENOUS
  Administered 2020-11-11: 100 mg via INTRAVENOUS
  Administered 2020-11-11: 50 mg via INTRAVENOUS

## 2020-11-11 MED ORDER — MAGNESIUM SULFATE 4 GM/100ML IV SOLN
4.0000 g | Freq: Once | INTRAVENOUS | Status: AC
  Administered 2020-11-11: 4 g via INTRAVENOUS
  Filled 2020-11-11: qty 100

## 2020-11-11 MED ORDER — ACETAMINOPHEN 500 MG PO TABS
1000.0000 mg | ORAL_TABLET | Freq: Four times a day (QID) | ORAL | Status: DC
  Administered 2020-11-12 – 2020-11-14 (×10): 1000 mg via ORAL
  Filled 2020-11-11 (×10): qty 2

## 2020-11-11 MED ORDER — DOCUSATE SODIUM 100 MG PO CAPS
200.0000 mg | ORAL_CAPSULE | Freq: Every day | ORAL | Status: DC
  Administered 2020-11-12 – 2020-11-13 (×2): 200 mg via ORAL
  Filled 2020-11-11 (×2): qty 2

## 2020-11-11 MED ORDER — EPHEDRINE SULFATE-NACL 50-0.9 MG/10ML-% IV SOSY
PREFILLED_SYRINGE | INTRAVENOUS | Status: DC | PRN
  Administered 2020-11-11 (×2): 5 mg via INTRAVENOUS

## 2020-11-11 MED ORDER — CEFAZOLIN SODIUM-DEXTROSE 2-4 GM/100ML-% IV SOLN
2.0000 g | Freq: Three times a day (TID) | INTRAVENOUS | Status: AC
  Administered 2020-11-11 – 2020-11-13 (×6): 2 g via INTRAVENOUS
  Filled 2020-11-11 (×6): qty 100

## 2020-11-11 MED ORDER — METOPROLOL TARTRATE 25 MG/10 ML ORAL SUSPENSION: 12.5000 mg | Freq: Two times a day (BID) | ORAL | Status: DC

## 2020-11-11 MED ORDER — PLASMA-LYTE A IV SOLN
INTRAVENOUS | Status: DC | PRN
  Administered 2020-11-11: 1000 mL

## 2020-11-11 MED ORDER — SODIUM CHLORIDE 0.9 % IV SOLN: INTRAVENOUS | Status: DC

## 2020-11-11 MED ORDER — SODIUM CHLORIDE 0.9 % IV SOLN: 250.0000 mL | INTRAVENOUS | Status: DC

## 2020-11-11 MED ORDER — SODIUM CHLORIDE 0.45 % IV SOLN: INTRAVENOUS | Status: DC | PRN

## 2020-11-11 MED ORDER — DEXMEDETOMIDINE HCL IN NACL 400 MCG/100ML IV SOLN: 0.0000 ug/kg/h | INTRAVENOUS | Status: DC

## 2020-11-11 MED ORDER — ATORVASTATIN CALCIUM 80 MG PO TABS
80.0000 mg | ORAL_TABLET | Freq: Every day | ORAL | Status: DC
  Administered 2020-11-12 – 2020-11-16 (×5): 80 mg via ORAL
  Filled 2020-11-11 (×5): qty 1

## 2020-11-11 MED ORDER — METOPROLOL TARTRATE 12.5 MG HALF TABLET: 12.5000 mg | ORAL_TABLET | Freq: Two times a day (BID) | ORAL | Status: DC

## 2020-11-11 MED ORDER — LORATADINE 10 MG PO TABS
10.0000 mg | ORAL_TABLET | Freq: Every morning | ORAL | Status: DC
  Administered 2020-11-12 – 2020-11-16 (×5): 10 mg via ORAL
  Filled 2020-11-11 (×5): qty 1

## 2020-11-11 MED ORDER — BISACODYL 10 MG RE SUPP: 10.0000 mg | Freq: Every day | RECTAL | Status: DC

## 2020-11-11 MED ORDER — ACETAMINOPHEN 650 MG RE SUPP
650.0000 mg | Freq: Once | RECTAL | Status: AC
  Administered 2020-11-11: 650 mg via RECTAL

## 2020-11-11 MED ORDER — ALBUMIN HUMAN 5 % IV SOLN
250.0000 mL | INTRAVENOUS | Status: DC | PRN
  Administered 2020-11-11: 12.5 g via INTRAVENOUS

## 2020-11-11 MED ORDER — ACETAMINOPHEN 160 MG/5ML PO SOLN: 1000.0000 mg | Freq: Four times a day (QID) | ORAL | Status: DC

## 2020-11-11 MED ORDER — METOPROLOL TARTRATE 12.5 MG HALF TABLET: 12.5000 mg | ORAL_TABLET | Freq: Once | ORAL | Status: AC

## 2020-11-11 MED ORDER — METOPROLOL TARTRATE 5 MG/5ML IV SOLN: 2.5000 mg | INTRAVENOUS | Status: DC | PRN

## 2020-11-11 MED ORDER — FAMOTIDINE IN NACL 20-0.9 MG/50ML-% IV SOLN
20.0000 mg | Freq: Two times a day (BID) | INTRAVENOUS | Status: AC
  Administered 2020-11-11: 20 mg via INTRAVENOUS
  Filled 2020-11-11: qty 50

## 2020-11-11 MED ORDER — TRAMADOL HCL 50 MG PO TABS
50.0000 mg | ORAL_TABLET | ORAL | Status: DC | PRN
  Administered 2020-11-11 – 2020-11-12 (×2): 100 mg via ORAL
  Filled 2020-11-11 (×2): qty 2

## 2020-11-11 MED ORDER — ASPIRIN 81 MG PO CHEW
324.0000 mg | CHEWABLE_TABLET | Freq: Every day | ORAL | Status: DC
  Administered 2020-11-12: 324 mg
  Filled 2020-11-11: qty 4

## 2020-11-11 MED ORDER — CHLORHEXIDINE GLUCONATE 0.12 % MT SOLN
15.0000 mL | OROMUCOSAL | Status: AC
  Administered 2020-11-11: 15 mL via OROMUCOSAL

## 2020-11-11 MED ORDER — MIDAZOLAM HCL (PF) 10 MG/2ML IJ SOLN
INTRAMUSCULAR | Status: AC
  Filled 2020-11-11: qty 2

## 2020-11-11 MED ORDER — CHLORHEXIDINE GLUCONATE CLOTH 2 % EX PADS
6.0000 | MEDICATED_PAD | Freq: Every day | CUTANEOUS | Status: DC
  Administered 2020-11-11 – 2020-11-13 (×3): 6 via TOPICAL

## 2020-11-11 SURGICAL SUPPLY — 121 items
ADAPTER CARDIO PERF ANTE/RETRO (ADAPTER) ×6 IMPLANT
BAG DECANTER FOR FLEXI CONT (MISCELLANEOUS) ×6 IMPLANT
BLADE CLIPPER SURG (BLADE) ×6 IMPLANT
BLADE STERNUM SYSTEM 6 (BLADE) ×6 IMPLANT
BLADE SURG 11 STRL SS (BLADE) ×6 IMPLANT
BLADE SURG 15 STRL LF DISP TIS (BLADE) ×4 IMPLANT
BLADE SURG 15 STRL SS (BLADE) ×6
BNDG ELASTIC 4X5.8 VLCR STR LF (GAUZE/BANDAGES/DRESSINGS) ×6 IMPLANT
BNDG ELASTIC 6X5.8 VLCR STR LF (GAUZE/BANDAGES/DRESSINGS) ×6 IMPLANT
BNDG GAUZE ELAST 4 BULKY (GAUZE/BANDAGES/DRESSINGS) ×6 IMPLANT
CANISTER SUCT 3000ML PPV (MISCELLANEOUS) ×6 IMPLANT
CANNULA GUNDRY RCSP 15FR (MISCELLANEOUS) ×6 IMPLANT
CATH HEART VENT LEFT (CATHETERS) ×4 IMPLANT
CATH ROBINSON RED A/P 18FR (CATHETERS) ×18 IMPLANT
CATH THORACIC 28FR (CATHETERS) ×6 IMPLANT
CATH THORACIC 36FR (CATHETERS) ×6 IMPLANT
CATH THORACIC 36FR RT ANG (CATHETERS) ×6 IMPLANT
CLIP VESOCCLUDE MED 24/CT (CLIP) IMPLANT
CLIP VESOCCLUDE SM WIDE 24/CT (CLIP) IMPLANT
CNTNR URN SCR LID CUP LEK RST (MISCELLANEOUS) ×4 IMPLANT
CONT SPEC 4OZ STRL OR WHT (MISCELLANEOUS) ×6
CONTAINER PROTECT SURGISLUSH (MISCELLANEOUS) ×12 IMPLANT
COVER SURGICAL LIGHT HANDLE (MISCELLANEOUS) ×6 IMPLANT
DERMABOND ADVANCED (GAUZE/BANDAGES/DRESSINGS) ×2
DERMABOND ADVANCED .7 DNX12 (GAUZE/BANDAGES/DRESSINGS) ×4 IMPLANT
DEVICE SUT CK QUICK LOAD MINI (Prosthesis & Implant Heart) ×5 IMPLANT
DRAPE CARDIOVASCULAR INCISE (DRAPES) ×6
DRAPE SRG 135X102X78XABS (DRAPES) ×4 IMPLANT
DRAPE WARM FLUID 44X44 (DRAPES) ×6 IMPLANT
DRSG COVADERM 4X14 (GAUZE/BANDAGES/DRESSINGS) ×6 IMPLANT
ELECT CAUTERY BLADE 6.4 (BLADE) ×6 IMPLANT
ELECT REM PT RETURN 9FT ADLT (ELECTROSURGICAL) ×12
ELECTRODE REM PT RTRN 9FT ADLT (ELECTROSURGICAL) ×8 IMPLANT
FELT TEFLON 1X6 (MISCELLANEOUS) ×12 IMPLANT
GAUZE 4X4 16PLY ~~LOC~~+RFID DBL (SPONGE) ×6 IMPLANT
GAUZE SPONGE 4X4 12PLY STRL (GAUZE/BANDAGES/DRESSINGS) ×6 IMPLANT
GAUZE SPONGE 4X4 12PLY STRL LF (GAUZE/BANDAGES/DRESSINGS) ×6 IMPLANT
GLOVE SURG ENC MOIS LTX SZ6 (GLOVE) IMPLANT
GLOVE SURG ENC MOIS LTX SZ6.5 (GLOVE) IMPLANT
GLOVE SURG ENC MOIS LTX SZ7 (GLOVE) IMPLANT
GLOVE SURG ENC MOIS LTX SZ7.5 (GLOVE) IMPLANT
GLOVE SURG MICRO LTX SZ7 (GLOVE) ×12 IMPLANT
GLOVE SURG ORTHO LTX SZ7.5 (GLOVE) IMPLANT
GLOVE SURG UNDER POLY LF SZ6 (GLOVE) IMPLANT
GLOVE SURG UNDER POLY LF SZ6.5 (GLOVE) IMPLANT
GLOVE SURG UNDER POLY LF SZ7 (GLOVE) IMPLANT
GOWN STRL REUS W/ TWL LRG LVL3 (GOWN DISPOSABLE) ×32 IMPLANT
GOWN STRL REUS W/ TWL XL LVL3 (GOWN DISPOSABLE) ×4 IMPLANT
GOWN STRL REUS W/TWL LRG LVL3 (GOWN DISPOSABLE) ×48
GOWN STRL REUS W/TWL XL LVL3 (GOWN DISPOSABLE) ×6
HEMOSTAT POWDER SURGIFOAM 1G (HEMOSTASIS) ×18 IMPLANT
HEMOSTAT SURGICEL 2X14 (HEMOSTASIS) ×6 IMPLANT
INSERT FOGARTY 61MM (MISCELLANEOUS) IMPLANT
INSERT FOGARTY XLG (MISCELLANEOUS) IMPLANT
KIT BASIN OR (CUSTOM PROCEDURE TRAY) ×6 IMPLANT
KIT CATH CPB BARTLE (MISCELLANEOUS) ×6 IMPLANT
KIT COMBO MINI 4X17COR-KNOT (Prosthesis & Implant Heart) ×1 IMPLANT
KIT SUCTION CATH 14FR (SUCTIONS) ×6 IMPLANT
KIT SUT CK MINI COMBO 4X17 (Prosthesis & Implant Heart) ×5 IMPLANT
KIT TURNOVER KIT B (KITS) ×6 IMPLANT
KIT VASOVIEW HEMOPRO 2 VH 4000 (KITS) ×6 IMPLANT
LINE VENT (MISCELLANEOUS) ×6 IMPLANT
LOAD QUICK .035MM CK MINI (Prosthesis & Implant Heart) ×1 IMPLANT
NS IRRIG 1000ML POUR BTL (IV SOLUTION) ×36 IMPLANT
PACK E OPEN HEART (SUTURE) ×6 IMPLANT
PACK OPEN HEART (CUSTOM PROCEDURE TRAY) ×6 IMPLANT
PAD ARMBOARD 7.5X6 YLW CONV (MISCELLANEOUS) ×12 IMPLANT
PAD ELECT DEFIB RADIOL ZOLL (MISCELLANEOUS) ×6 IMPLANT
PENCIL BUTTON HOLSTER BLD 10FT (ELECTRODE) ×6 IMPLANT
POSITIONER HEAD DONUT 9IN (MISCELLANEOUS) ×6 IMPLANT
PUNCH AORTIC ROTATE 4.0MM (MISCELLANEOUS) IMPLANT
PUNCH AORTIC ROTATE 4.5MM 8IN (MISCELLANEOUS) ×12 IMPLANT
PUNCH AORTIC ROTATE 5MM 8IN (MISCELLANEOUS) IMPLANT
SET MPS 3-ND DEL (MISCELLANEOUS) ×6 IMPLANT
SOL ANTI FOG 6CC (MISCELLANEOUS) ×4 IMPLANT
SOLUTION ANTI FOG 6CC (MISCELLANEOUS) ×2
SPONGE INTESTINAL PEANUT (DISPOSABLE) IMPLANT
SPONGE T-LAP 18X18 ~~LOC~~+RFID (SPONGE) ×24 IMPLANT
SPONGE T-LAP 4X18 ~~LOC~~+RFID (SPONGE) ×6 IMPLANT
SUPPORT HEART JANKE-BARRON (MISCELLANEOUS) ×6 IMPLANT
SUT BONE WAX W31G (SUTURE) ×6 IMPLANT
SUT EB EXC GRN/WHT 2-0 V-5 (SUTURE) ×24 IMPLANT
SUT ETHIBON EXCEL 2-0 V-5 (SUTURE) ×24 IMPLANT
SUT ETHIBOND V-5 VALVE (SUTURE) ×12 IMPLANT
SUT MNCRL AB 4-0 PS2 18 (SUTURE) ×6 IMPLANT
SUT PROLENE 3 0 SH DA (SUTURE) ×6 IMPLANT
SUT PROLENE 3 0 SH1 36 (SUTURE) ×6 IMPLANT
SUT PROLENE 4 0 RB 1 (SUTURE) ×30
SUT PROLENE 4 0 SH DA (SUTURE) ×6 IMPLANT
SUT PROLENE 4-0 RB1 .5 CRCL 36 (SUTURE) ×20 IMPLANT
SUT PROLENE 5 0 C 1 36 (SUTURE) IMPLANT
SUT PROLENE 6 0 C 1 30 (SUTURE) ×6 IMPLANT
SUT PROLENE 7 0 BV 1 (SUTURE) IMPLANT
SUT PROLENE 7 0 BV1 MDA (SUTURE) ×12 IMPLANT
SUT PROLENE 8 0 BV175 6 (SUTURE) IMPLANT
SUT SILK  1 MH (SUTURE)
SUT SILK 1 MH (SUTURE) IMPLANT
SUT SILK 2 0 SH (SUTURE) IMPLANT
SUT STEEL 6MS V (SUTURE) ×12 IMPLANT
SUT STEEL STERNAL CCS#1 18IN (SUTURE) IMPLANT
SUT STEEL SZ 6 DBL 3X14 BALL (SUTURE) IMPLANT
SUT VIC AB 1 CTX 36 (SUTURE) ×18
SUT VIC AB 1 CTX36XBRD ANBCTR (SUTURE) ×12 IMPLANT
SUT VIC AB 2-0 CT1 27 (SUTURE) ×6
SUT VIC AB 2-0 CT1 TAPERPNT 27 (SUTURE) ×4 IMPLANT
SUT VIC AB 2-0 CTX 27 (SUTURE) IMPLANT
SUT VIC AB 3-0 SH 27 (SUTURE)
SUT VIC AB 3-0 SH 27X BRD (SUTURE) IMPLANT
SUT VIC AB 3-0 X1 27 (SUTURE) IMPLANT
SUT VICRYL 4-0 PS2 18IN ABS (SUTURE) IMPLANT
SYSTEM SAHARA CHEST DRAIN ATS (WOUND CARE) ×6 IMPLANT
TAPE CLOTH SURG 4X10 WHT LF (GAUZE/BANDAGES/DRESSINGS) ×12 IMPLANT
TAPE PAPER 2X10 WHT MICROPORE (GAUZE/BANDAGES/DRESSINGS) ×12 IMPLANT
TOWEL GREEN STERILE (TOWEL DISPOSABLE) ×6 IMPLANT
TOWEL GREEN STERILE FF (TOWEL DISPOSABLE) ×6 IMPLANT
TRAY FOLEY SLVR 16FR TEMP STAT (SET/KITS/TRAYS/PACK) ×6 IMPLANT
TUBING LAP HI FLOW INSUFFLATIO (TUBING) ×6 IMPLANT
UNDERPAD 30X36 HEAVY ABSORB (UNDERPADS AND DIAPERS) ×6 IMPLANT
VALVE AORTIC SZ21 INSP/RESIL (Valve) ×6 IMPLANT
VENT LEFT HEART 12002 (CATHETERS) ×6
WATER STERILE IRR 1000ML POUR (IV SOLUTION) ×12 IMPLANT

## 2020-11-11 NOTE — Anesthesia Procedure Notes (Signed)
Arterial Line Insertion Start/End11/11/2020 7:11 AM, 11/11/2020 7:21 AM Performed by: Duane Boston, MD, anesthesiologist  Patient location: Pre-op. Preanesthetic checklist: patient identified, IV checked, site marked, risks and benefits discussed, surgical consent, monitors and equipment checked, pre-op evaluation, timeout performed and anesthesia consent Lidocaine 1% used for infiltration Left, brachial was placed Catheter size: 22 G Hand hygiene performed  and maximum sterile barriers used   Attempts: 1 Procedure performed using ultrasound guided technique. Ultrasound Notes:anatomy identified, needle tip was noted to be adjacent to the nerve/plexus identified, no ultrasound evidence of intravascular and/or intraneural injection and image(s) printed for medical record Following insertion, dressing applied and Biopatch. Post procedure assessment: normal and unchanged  Patient tolerated the procedure well with no immediate complications.

## 2020-11-11 NOTE — Anesthesia Procedure Notes (Signed)
Central Venous Catheter Insertion Performed by: Duane Boston, MD, anesthesiologist Start/End11/11/2020 6:51 AM, 11/11/2020 7:01 AM Patient location: Pre-op. Preanesthetic checklist: patient identified, IV checked, site marked, risks and benefits discussed, surgical consent, monitors and equipment checked, pre-op evaluation, timeout performed and anesthesia consent Position: Trendelenburg Lidocaine 1% used for infiltration and patient sedated Hand hygiene performed , maximum sterile barriers used  and Seldinger technique used Catheter size: 8.5 Fr Total catheter length 8. PA cath was placed.Sheath introducer Swan type:thermodilution PA Cath depth:50 Procedure performed using ultrasound guided technique. Ultrasound Notes:anatomy identified, needle tip was noted to be adjacent to the nerve/plexus identified, no ultrasound evidence of intravascular and/or intraneural injection and image(s) printed for medical record Attempts: 1 Following insertion, line sutured and dressing applied. Post procedure assessment: free fluid flow, blood return through all ports and no air  Patient tolerated the procedure well with no immediate complications.

## 2020-11-11 NOTE — Progress Notes (Signed)
  Echocardiogram Echocardiogram Transesophageal has been performed.  Johny Chess 11/11/2020, 8:26 AM

## 2020-11-11 NOTE — Progress Notes (Signed)
1325 - Pt arrived from OR.  Full report received.  All questions answered.  Safety checks performed.  All lines, drips, and drains verified. Hand hygiene performed before/after each pt contact. 1400 - CXR performed.  Pt became hypertensive.  Dr. Cyndia Bent rounded on pt. 1791 - Precedex weaning started. 1500 - Pt bathed w/CHG. 60 - Pt's daughter, Butch Penny [phone], arrived.  Password set.  Butch Penny is point of contact. [place] turned off. 1602 - Weaning started. 1634 - Pt placed on CPAP. 1700 - Pt's husband, Hedy Camara, arrived to visit. 1710 - RT assessment; pt still too sleepy to extubate. 1900 - Report given to PM RN.  All questions answered. 1925 - Pt extubated.

## 2020-11-11 NOTE — Plan of Care (Signed)

## 2020-11-11 NOTE — Anesthesia Postprocedure Evaluation (Signed)
Anesthesia Post Note  Patient: Shelley Berg  Procedure(s) Performed: CORONARY ARTERY BYPASS GRAFTING (CABG) X  2 ON PUMP USING RIGHT ENDOSCOPIC GREATER SAPHENOUS VEIN CONDUITS (Chest) AORTIC VALVE REPLACEMENT (AVR) WITH INSPIRIS RESILIA AORTIC VALVE SIZE 21MM (Chest) TRANSESOPHAGEAL ECHOCARDIOGRAM (TEE) APPLICATION OF CELL SAVER ENDOVEIN HARVEST OF GREATER SAPHENOUS VEIN (Right)     Patient location during evaluation: SICU Anesthesia Type: General Level of consciousness: sedated Pain management: pain level controlled Vital Signs Assessment: post-procedure vital signs reviewed and stable Respiratory status: patient remains intubated per anesthesia plan Cardiovascular status: stable Postop Assessment: no apparent nausea or vomiting Anesthetic complications: no   No notable events documented.  Last Vitals:  Vitals:   11/11/20 0714 11/11/20 1331  BP:    Pulse: 64 68  Resp: (!) 25 16  Temp:    SpO2: 99% 99%    Last Pain:  Vitals:   11/11/20 0622  TempSrc:   PainSc: 0-No pain                 Shelley Berg

## 2020-11-11 NOTE — Interval H&P Note (Signed)
History and Physical Interval Note:  11/11/2020 6:48 AM  Shelley Berg  has presented today for surgery, with the diagnosis of CAD AS.  The various methods of treatment have been discussed with the patient and family. After consideration of risks, benefits and other options for treatment, the patient has consented to  Procedure(s): CORONARY ARTERY BYPASS GRAFTING (CABG) (N/A) AORTIC VALVE REPLACEMENT (AVR) (N/A) TRANSESOPHAGEAL ECHOCARDIOGRAM (TEE) (N/A) as a surgical intervention.  The patient's history has been reviewed, patient examined, no change in status, stable for surgery.  I have reviewed the patient's chart and labs.  Questions were answered to the patient's satisfaction.     Gaye Pollack

## 2020-11-11 NOTE — Progress Notes (Signed)
Patient ID: Shelley Berg, female   DOB: 1945-12-18, 75 y.o.   MRN: [id number]  TCTS Evening Rounds:   Hemodynamically stable  CI = 1.8  Has started to wake up on vent.   Urine output good  CT output low  CBC    Component Value Date/Time   WBC 17.2 (H) 11/11/2020 1330   RBC 3.18 (L) 11/11/2020 1330   HGB 9.9 (L) 11/11/2020 1330   HGB 14.2 08/08/2020 1025   HCT 29.4 (L) 11/11/2020 1330   HCT 42.5 08/08/2020 1025   PLT 163 11/11/2020 1330   PLT 334 08/08/2020 1025   MCV 92.5 11/11/2020 1330   MCV 92 08/08/2020 1025   MCH 31.1 11/11/2020 1330   MCHC 33.7 11/11/2020 1330   RDW 12.7 11/11/2020 1330   RDW 13.3 08/08/2020 1025     BMET    Component Value Date/Time   NA 142 11/11/2020 1230   NA 142 08/08/2020 1025   K 4.3 11/11/2020 1230   CL 108 11/11/2020 1223   CO2 25 11/09/2020 1435   GLUCOSE 147 (H) 11/11/2020 1223   BUN 6 (L) 11/11/2020 1223   BUN 10 08/08/2020 1025   CREATININE 0.30 (L) 11/11/2020 1223   CALCIUM 9.2 11/09/2020 1435   EGFR 93 08/08/2020 1025   GFRNONAA >60 11/09/2020 1435     A/P:  Stable postop course. Continue current plans

## 2020-11-11 NOTE — Anesthesia Procedure Notes (Signed)
Procedure Name: Intubation Date/Time: 11/11/2020 7:51 AM Performed by: Trinna Post., CRNA Pre-anesthesia Checklist: Patient identified, Emergency Drugs available, Suction available, Patient being monitored and Timeout performed Patient Re-evaluated:Patient Re-evaluated prior to induction Oxygen Delivery Method: Circle system utilized Preoxygenation: Pre-oxygenation with 100% oxygen Induction Type: IV induction Ventilation: Mask ventilation without difficulty Laryngoscope Size: Mac and 3 Grade View: Grade I Tube type: Oral Tube size: 7.5 mm Number of attempts: 1 Airway Equipment and Method: Stylet Placement Confirmation: ETT inserted through vocal cords under direct vision, positive ETCO2 and breath sounds checked- equal and bilateral Secured at: 22 cm Tube secured with: Tape Dental Injury: Teeth and Oropharynx as per pre-operative assessment

## 2020-11-11 NOTE — Op Note (Signed)
CARDIOVASCULAR SURGERY OPERATIVE NOTE  11/11/2020  Surgeon:  Gaye Pollack, MD  First Assistant: Ellwood Handler,  PA-C:   An experienced assistant was required given the complexity of this surgery and the standard of surgical care. The assistant was needed for saphenous vein harvesting, exposure, dissection, suctioning, retraction of delicate tissues and sutures, instrument exchange and for overall help during this procedure.   Preoperative Diagnosis:  Severe multi-vessel coronary artery disease and severe aortic stenosis.   Postoperative Diagnosis:  Same   Procedure:  Median Sternotomy Extracorporeal circulation 3.   Coronary artery bypass grafting x 2  SVG to OM SVG to PDA   4.   Endoscopic vein harvest from the rignt leg 5.   Aortic valve replacement using a 21 mm Edwards INSPIRIS RESILIA pericardial valve.   Anesthesia:  General Endotracheal   Clinical History/Surgical Indication:  This 75 year old woman has stage D, moderate to severe symptomatic aortic stenosis and severe three-vessel coronary disease with New York Heart Association class II symptoms of exertional chest discomfort and shortness of breath consistent with chronic diastolic congestive heart failure and stable exertional angina.  I have personally reviewed her 2D echocardiogram and cardiac catheterization studies.  Her echo shows a calcified and thickened aortic valve with restricted leaflet mobility.  The mean gradient is 28 mmHg with a dimensionless index of 0.27 and a valve area of 0.7 cm consistent with moderate to severe aortic stenosis.  Left ventricular systolic function is normal.  Cardiac catheterization shows significant three-vessel coronary disease with the most significant lesion being 85% mid left circumflex stenosis compromising a large marginal branch.  There is also mid RCA occlusion with faint filling of  the posterior descending branch by collaterals.  The LAD has moderate proximal to mid vessel disease that is probably not flow-limiting with a relatively small distal vessel.  I agree that the best treatment for this patient is coronary bypass graft surgery and aortic valve replacement.  We discussed the alternatives of PCI of the left circumflex followed by TAVR if she remains symptomatic but her annulus is borderline for a 23 mm SAPIEN 3 valve and since she has a calcified bicuspid aortic valve I think she would be better off with open surgical aortic valve replacement.  Her gated cardiac CTA shows severely reduced cusp separation with severely thickened and calcified aortic valve cusps consistent with severe aortic stenosis.  I reviewed all of the above findings with her and my recommendation for open surgical AVR and CABG using a bioprosthetic valve.  I would probably leave the LAD alone since the degree of stenosis is only moderate, it is a relatively small vessel distally, and she is a relatively short woman who is likely to have a short, small LIMA that would probably not be ideal.   I discussed the operative procedure with the patient and her daughter including alternatives, benefits and risks; including but not limited to bleeding, blood transfusion, infection, stroke, myocardial infarction, graft failure, heart block requiring a permanent pacemaker, organ dysfunction, and death.  Anhthu B Leth understands and agrees to proceed.    Preparation:  The patient was seen in the preoperative holding area and the correct patient, correct operation were confirmed with the patient after reviewing the medical record and catheterization. The consent was signed by me. Preoperative antibiotics were given. A pulmonary arterial line and radial arterial line were placed by the anesthesia team. The patient was taken back to the operating room and positioned supine on the operating room  table. After being placed  under general endotracheal anesthesia by the anesthesia team a foley catheter was placed. The neck, chest, abdomen, and both legs were prepped with betadine soap and solution and draped in the usual sterile manner. A surgical time-out was taken and the correct patient and operative procedure were confirmed with the nursing and anesthesia staff.   Cardiopulmonary Bypass:  A median sternotomy was performed. The pericardium was opened in the midline. Right ventricular function appeared normal. The ascending aorta was of normal size and had no palpable plaque. There were no contraindications to aortic cannulation or cross-clamping. The patient was fully systemically heparinized and the ACT was maintained > 400 sec. The proximal aortic arch was cannulated with a 20 F aortic cannula for arterial inflow. Venous cannulation was performed via the right atrial appendage using a two-staged venous cannula. An antegrade cardioplegia/vent cannula was inserted into the mid-ascending aorta. A left ventricular vent was inserted into the right superior pulmonary vein. A retrograde cardioplegia cannula was inserted into the coronary sinus. Aortic occlusion was performed with a single cross-clamp. Systemic cooling to 32 degrees Centigrade and topical cooling of the heart with iced saline were used. Cold antegrade KBC cardioplegia was used to induce diastolic arrest and was then given at about 60 minute intervals throughout the period of arrest to maintain myocardial temperature at or below 10 degrees centigrade. A temperature probe was inserted into the interventricular septum and an insulating pad was placed in the pericardium.   Endoscopic vein harvest:  The right greater saphenous vein was harvested endoscopically through a 2 cm incision medial to the right knee. It was harvested from the upper thigh to below the knee. It was a medium-sized vein of good quality. The side branches were all ligated with 4-0 silk ties.     Coronary arteries:  The coronary arteries were examined.  LAD:  small diffusely diseased distal vessel LCX:  Medium sized OM with no distal disease RCA:  Medium sized PDA and small PL branches.   Grafts:  SVG to OM: 1.75 mm. It was sewn end to side using 7-0 prolene continuous suture. SVG to PDA:  1.6 mm. It was sewn end to side using 7-0 prolene continuous suture.  The proximal vein graft anastomoses were performed to the mid-ascending aorta using continuous 6-0 prolene suture. Graft markers were placed around the proximal anastomoses.  Aortic Valve Replacement:   A transverse aortotomy was performed 1 cm above the take-off of the right coronary artery. The native valve was bicuspid with fusion of the left and right cusps with a single raphe.  The valve had severely calcified leaflets  and moderate annular calcification. The ostia of the coronary arteries were in normal position and were not obstructed. The native valve leaflets were excised and the annulus was decalcified with rongeurs. Care was taken to remove all particulate debris. The left ventricle was directly inspected for debris and then irrigated with ice saline solution. The annulus was sized and a size 21 mm Edwards INSPIRIS RESILIA pericardial valve was chosen. The model number was [id number] and the serial number was [id number].  While the valve was being prepared 2-0 Ethibond pledgeted horizontal mattress sutures were placed around the annulus with the pledgets in a sub-annular position. The sutures were placed through the sewing ring and the valve lowered into place. The sutures were tied sequentially using CorKnots. The valve seated nicely and the coronary ostia were not obstructed. The prosthetic valve leaflets moved normally and there was  no sub-valvular obstruction. The aortotomy was closed using 4-0 Prolene suture in 2 layers with felt strips to reinforce the closure.    Completion:  The patient was rewarmed to 37 degrees  Centigrade. A dose of warm retrograde KBC reanimation cardioplegia was given. The crossclamp was removed with a time of 108 minutes. There was spontaneous return of sinus rhythm. The distal and proximal anastomoses were checked for hemostasis. The position of the grafts was satisfactory. Two temporary epicardial pacing wires were placed on the right atrium and two on the right ventricle. The patient was weaned from CPB without difficulty on no inotropes. CPB time was 132 minutes. Cardiac output was 4 LPM. TEE showed a normally functioning prosthetic aortic valve without paravalvular leak. LV systolic function was normal. There was unchanged trivial MR. Heparin was fully reversed with protamine and the aortic and venous cannulas removed. Hemostasis was achieved. Mediastinal drainage tubes were placed. The sternum was closed with #6 stainless steel wires. The fascia was closed with continuous # 1 vicryl suture. The subcutaneous tissue was closed with 2-0 vicryl continuous suture. The skin was closed with 3-0 vicryl subcuticular suture. All sponge, needle, and instrument counts were reported correct at the end of the case. Dry sterile dressings were placed over the incisions and around the chest tubes which were connected to pleurevac suction. The patient was then transported to the surgical intensive care unit in stable condition.

## 2020-11-11 NOTE — Brief Op Note (Signed)
11/11/2020  8:26 AM  PATIENT:  Shelley Berg  75 y.o. female  PRE-OPERATIVE DIAGNOSIS:  Coronary Artery Disease Aortic Stenosis  POST-OPERATIVE DIAGNOSIS:  Coronary Artery Disease Aortic Stenosis  PROCEDURE:  Procedure(s):  CORONARY ARTERY BYPASS GRAFTING X  2  -SVG to PDA -SVG to OM  AORTIC VALVE REPLACEMENT  -21 mm Edwards Resilia Bioprosthetic Valve  ENDOVEIN HARVEST OF GREATER SAPHENOUS VEIN ] -Right Leg (40 min/20 min)  TRANSESOPHAGEAL ECHOCARDIOGRAM (TEE) (N/A)  APPLICATION OF CELL SAVER  SURGEON:  Surgeon(s) and Role:    * Gaye Pollack, MD - Primary  PHYSICIAN ASSISTANT: Ellwood Handler PA-C  ASSISTANTS: Dineen Kid RNFA   ANESTHESIA:   general  EBL:  600 mL   BLOOD ADMINISTERED:   CC CELLSAVER  DRAINS:  Mediastinal Chest Drains    LOCAL MEDICATIONS USED:  NONE  SPECIMEN:  Source of Specimen:  Aortic Valve Leaflets  DISPOSITION OF SPECIMEN:  PATHOLOGY  COUNTS:  YES  TOURNIQUET:  * No tourniquets in log *  DICTATION: .Dragon Dictation  PLAN OF CARE: Admit to inpatient   PATIENT DISPOSITION:  ICU - intubated and hemodynamically stable.   Delay start of Pharmacological VTE agent (>24hrs) due to surgical blood loss or risk of bleeding: yes

## 2020-11-11 NOTE — Hospital Course (Addendum)
History of Present Illness:  The patient is a 75 year old woman with a history of hypertension, hyperlipidemia, and rheumatoid arthritis on methotrexate for the past few years who was evaluated for a heart murmur and exertional substernal chest discomfort.  A 2D echocardiogram on 05/06/2020 showed a thickened and calcified aortic valve with mean gradient of 28 mmHg and a peak gradient of 47 mmHg.  Dimensionless index was 0.27 consistent with moderate aortic stenosis.  Aortic valve area by VTI was 0.7 cm.  Stroke-volume was 54 cc.  Left ventricular ejection fraction was 70 to 75% with grade 1 diastolic dysfunction.  There is mild mitral valve regurgitation.  She subsequent underwent a nuclear stress test on 07/28/2020 showing mild inferolateral hypokinesis with an ejection fraction of 52%.  There was a moderate size region of ischemia noted in the inferolateral wall.  There were no electrocardiogram changes.  This was a high risk scan.  She underwent cardiac catheterization on 08/10/2020 showing significant three-vessel coronary disease.  The left circumflex had an 85% mid vessel stenosis compromising a large marginal branch.  The LAD has 60% proximal and 40% mid vessel stenosis.  The right coronary was occluded with filling of the distal vessel by collaterals.  A peak to peak gradient across aortic valve was 42 mmHg and the mean gradient was 25 mmHg with a valve area of 1 cm.  She was evaluated by Dr. Cyndia Bent at which time the patient reported exertional substernal chest discomfort and shortness of breath with exertion such as walking up steps or an incline.  She has had no symptoms at rest.  She did not think much about the symptoms prior to being told that she had aortic stenosis and coronary disease.  She denies any dizziness or syncope.  She has had no peripheral edema.  It was felt the patient should undergo Coronary bypass grafting with replacement of her Aortic Valve.  The risks and benefits of the procedure  were explained to the patient and she was agreeable to proceed.  Hospital Course:  The patient presented to [facility] on 11/11/2020.  She was taken to the operating room and underwent CABG x 2 utilizing SVG to OM and SVG to PDA, Aortic Valve Replacement with a 21 mm Edwards Resilia Bioprosthetic Valve, and endoscopic vein harvest from her right leg.  She tolerated the procedure without difficulty and was taken to the SICU in stable condition.  She was extubated the evening of surgery.   Patient has remained hemodynamically stable but has required low-dose Neo-Synephrine.  This was weaned by postop day #2.  Blood pressure has been somewhat on the lower side so beta-blocker had not been initiated.  She is noted to have an acute postop blood loss anemia and hemoglobin did drift below 7 and she subsequently has been transfused as well as started on iron.  She does have postoperative volume overload and is being diuresed.  Chest tubes were removed on postop day #2.  She is started on a course of routine cardiac rehab and pulmonary hygiene.  Her blood sugars have been under adequate control using standard treatments.  Her hemoglobin A1c was 6.2 preoperatively on no medications.  She is maintaining NSR and felt stable for transfer to the progressive care unit on 11/14/2020 however a bed was not available.  She continues to maintain NSR.  Her pacing wires were removed without difficulty.  She is participating with cardiac rehabilitation without difficulty.  Her surgical incisions are healing without evidence of  infection.  She is medically stable for discharge home today.

## 2020-11-11 NOTE — Procedures (Signed)
Extubation Procedure Note Pt extubated per Cardiac Wean protocol. Pt follows all commands. ABG normal. VC 787mls, NIF -24, Cuff leak +, No stridor. Extubated to 3L [place]  Patient Details:   Name: Shelley Berg DOB: 04-Dec-1945 MRN: [id number]   Airway Documentation:    Vent end date: 11/11/20 Vent end time: 1925   Evaluation  O2 sats: stable throughout Complications: No apparent complications Patient did tolerate procedure well. Bilateral Breath Sounds: Clear, Diminished   Yes  Marissa Nestle 11/11/2020, 7:43 PM

## 2020-11-11 NOTE — Transfer of Care (Signed)
Immediate Anesthesia Transfer of Care Note  Patient: Shelley Berg  Procedure(s) Performed: CORONARY ARTERY BYPASS GRAFTING (CABG) X  2 ON PUMP USING RIGHT ENDOSCOPIC GREATER SAPHENOUS VEIN CONDUITS (Chest) AORTIC VALVE REPLACEMENT (AVR) WITH INSPIRIS RESILIA AORTIC VALVE SIZE 21MM (Chest) TRANSESOPHAGEAL ECHOCARDIOGRAM (TEE) APPLICATION OF CELL SAVER ENDOVEIN HARVEST OF GREATER SAPHENOUS VEIN (Right)  Patient Location: PACU  Anesthesia Type:General  Level of Consciousness: sedated and Patient remains intubated per anesthesia plan  Airway & Oxygen Therapy: Patient remains intubated per anesthesia plan and Patient placed on Ventilator (see vital sign flow sheet for setting)  Post-op Assessment: Report given to RN and Post -op Vital signs reviewed and stable  Post vital signs: Reviewed and stable  Last Vitals:  Vitals Value Taken Time  BP    Temp 35.7 C 11/11/20 1337  Pulse 80 11/11/20 1337  Resp 16 11/11/20 1337  SpO2 93 % 11/11/20 1337  Vitals shown include unvalidated device data.  Last Pain:  Vitals:   11/11/20 0622  TempSrc:   PainSc: 0-No pain         Complications: No notable events documented.

## 2020-11-12 ENCOUNTER — Inpatient Hospital Stay (HOSPITAL_COMMUNITY): Payer: Medicare Other

## 2020-11-12 ENCOUNTER — Encounter (HOSPITAL_COMMUNITY): Payer: Self-pay | Admitting: Surgery

## 2020-11-12 LAB — BASIC METABOLIC PANEL
Anion gap: 6 (ref 5–15)
Anion gap: 7 (ref 5–15)
BUN: 8 mg/dL (ref 8–23)
BUN: 11 mg/dL (ref 8–23)
CO2: 21 mmol/L — ABNORMAL LOW (ref 22–32)
CO2: 21 mmol/L — ABNORMAL LOW (ref 22–32)
Calcium: 7.6 mg/dL — ABNORMAL LOW (ref 8.9–10.3)
Calcium: 7.8 mg/dL — ABNORMAL LOW (ref 8.9–10.3)
Chloride: 110 mmol/L (ref 98–111)
Chloride: 104 mmol/L (ref 98–111)
Creatinine, Ser: 0.59 mg/dL (ref 0.44–1.00)
Creatinine, Ser: 0.6 mg/dL (ref 0.44–1.00)
GFR, Estimated: >60 mL/min (ref >60)
GFR, Estimated: >60 mL/min (ref >60)
Glucose, Bld: 152 mg/dL — ABNORMAL HIGH (ref 70–99)
Glucose, Bld: 140 mg/dL — ABNORMAL HIGH (ref 70–99)
Potassium: 4.2 mmol/L (ref 3.5–5.1)
Potassium: 3.9 mmol/L (ref 3.5–5.1)
Sodium: 137 mmol/L (ref 135–145)
Sodium: 132 mmol/L — ABNORMAL LOW (ref 135–145)

## 2020-11-12 LAB — CBC
HCT: 25 % — ABNORMAL LOW (ref 36.0–46.0)
HCT: 23.3 % — ABNORMAL LOW (ref 36.0–46.0)
Hemoglobin: 8.7 g/dL — ABNORMAL LOW (ref 12.0–15.0)
Hemoglobin: 7.8 g/dL — ABNORMAL LOW (ref 12.0–15.0)
MCH: 31.8 pg (ref 26.0–34.0)
MCH: 31.1 pg (ref 26.0–34.0)
MCHC: 34.8 g/dL (ref 30.0–36.0)
MCHC: 33.5 g/dL (ref 30.0–36.0)
MCV: 91.2 fL (ref 80.0–100.0)
MCV: 92.8 fL (ref 80.0–100.0)
Platelets: 147 10*3/uL — ABNORMAL LOW (ref 150–400)
Platelets: 138 10*3/uL — ABNORMAL LOW (ref 150–400)
RBC: 2.74 MIL/uL — ABNORMAL LOW (ref 3.87–5.11)
RBC: 2.51 MIL/uL — ABNORMAL LOW (ref 3.87–5.11)
RDW: 13.1 % (ref 11.5–15.5)
RDW: 13.2 % (ref 11.5–15.5)
WBC: 11.3 10*3/uL — ABNORMAL HIGH (ref 4.0–10.5)
WBC: 14.5 10*3/uL — ABNORMAL HIGH (ref 4.0–10.5)
nRBC: 0 % (ref 0.0–0.2)
nRBC: 0 % (ref 0.0–0.2)

## 2020-11-12 LAB — GLUCOSE, CAPILLARY
Glucose-Capillary: 124 mg/dL — ABNORMAL HIGH (ref 70–99)
Glucose-Capillary: 158 mg/dL — ABNORMAL HIGH (ref 70–99)
Glucose-Capillary: 114 mg/dL — ABNORMAL HIGH (ref 70–99)
Glucose-Capillary: 149 mg/dL — ABNORMAL HIGH (ref 70–99)
Glucose-Capillary: 153 mg/dL — ABNORMAL HIGH (ref 70–99)
Glucose-Capillary: 142 mg/dL — ABNORMAL HIGH (ref 70–99)
Glucose-Capillary: 149 mg/dL — ABNORMAL HIGH (ref 70–99)
Glucose-Capillary: 166 mg/dL — ABNORMAL HIGH (ref 70–99)
Glucose-Capillary: 146 mg/dL — ABNORMAL HIGH (ref 70–99)
Glucose-Capillary: 139 mg/dL — ABNORMAL HIGH (ref 70–99)
Glucose-Capillary: 173 mg/dL — ABNORMAL HIGH (ref 70–99)
Glucose-Capillary: 130 mg/dL — ABNORMAL HIGH (ref 70–99)
Glucose-Capillary: 139 mg/dL — ABNORMAL HIGH (ref 70–99)
Glucose-Capillary: 144 mg/dL — ABNORMAL HIGH (ref 70–99)

## 2020-11-12 LAB — MAGNESIUM
Magnesium: 2.4 mg/dL (ref 1.7–2.4)
Magnesium: 2.2 mg/dL (ref 1.7–2.4)

## 2020-11-12 MED ORDER — INSULIN ASPART 100 UNIT/ML IJ SOLN
0.0000 [IU] | INTRAMUSCULAR | Status: DC
  Administered 2020-11-12 (×3): 2 [IU] via SUBCUTANEOUS
  Administered 2020-11-12: 4 [IU] via SUBCUTANEOUS
  Administered 2020-11-13 (×2): 2 [IU] via SUBCUTANEOUS

## 2020-11-12 MED ORDER — TRAMADOL HCL 50 MG PO TABS
50.0000 mg | ORAL_TABLET | ORAL | Status: DC | PRN
  Administered 2020-11-12 – 2020-11-13 (×3): 50 mg via ORAL
  Filled 2020-11-12 (×3): qty 1

## 2020-11-12 MED ORDER — SODIUM CHLORIDE 0.9% FLUSH: 10.0000 mL | INTRAVENOUS | Status: DC | PRN

## 2020-11-12 MED ORDER — INSULIN DETEMIR 100 UNIT/ML ~~LOC~~ SOLN
15.0000 [IU] | Freq: Every day | SUBCUTANEOUS | Status: DC
  Administered 2020-11-12 – 2020-11-13 (×2): 15 [IU] via SUBCUTANEOUS
  Filled 2020-11-12 (×3): qty 0.15

## 2020-11-12 MED ORDER — ENOXAPARIN SODIUM 40 MG/0.4ML IJ SOSY
40.0000 mg | PREFILLED_SYRINGE | Freq: Every day | INTRAMUSCULAR | Status: DC
  Administered 2020-11-12 – 2020-11-15 (×4): 40 mg via SUBCUTANEOUS
  Filled 2020-11-12 (×4): qty 0.4

## 2020-11-12 MED ORDER — SODIUM CHLORIDE 0.9% FLUSH
10.0000 mL | Freq: Two times a day (BID) | INTRAVENOUS | Status: DC
  Administered 2020-11-12 – 2020-11-13 (×3): 10 mL

## 2020-11-12 NOTE — Progress Notes (Signed)
1 Day Post-Op Procedure(s) (LRB): CORONARY ARTERY BYPASS GRAFTING (CABG) X  2 ON PUMP USING RIGHT ENDOSCOPIC GREATER SAPHENOUS VEIN CONDUITS (N/A) AORTIC VALVE REPLACEMENT (AVR) WITH INSPIRIS RESILIA AORTIC VALVE SIZE 21MM (N/A) TRANSESOPHAGEAL ECHOCARDIOGRAM (TEE) (N/A) APPLICATION OF CELL SAVER ENDOVEIN HARVEST OF GREATER SAPHENOUS VEIN (Right) Subjective:  No complaints. Had a stable night.  Objective: Vital signs in last 24 hours: Temp:  [95.9 F (35.5 C)-99.1 F (37.3 C)] 98.8 F (37.1 C) (11/12 0700) Pulse Rate:  [68-89] 86 (11/12 0700) Cardiac Rhythm: Atrial paced (11/12 0400) Resp:  [10-29] 26 (11/12 0700) BP: (94-106)/(58-63) 97/58 (11/12 0700) SpO2:  [94 %-100 %] 100 % (11/12 0700) Arterial Line BP: (80-146)/(50-94) 124/61 (11/12 0700) FiO2 (%):  [40 %-50 %] 40 % (11/11 1634) Weight:  [72.1 kg] 72.1 kg (11/12 0500)  Hemodynamic parameters for last 24 hours: PAP: (25-44)/(11-26) 39/23 CO:  [1.7 L/min-4 L/min] 4 L/min CI:  [1 L/min/m2-2.5 L/min/m2] 2.5 L/min/m2  Intake/Output from previous day: 11/11 0701 - 11/12 0700 In: 4745.3 [P.O.:55; I.V.:3174.4; Blood:350; IV Piggyback:1165.9] Out: 5628 [Urine:4730; Blood:600; Chest Tube:298] Intake/Output this shift: Total I/O In: -  Out: 50 [Urine:40; Chest Tube:10]  General appearance: alert and cooperative Neurologic: intact Heart: regular rate and rhythm, S1, S2 normal, no murmur Lungs: clear to auscultation bilaterally Extremities: edema mild Wound: dressing dry  Lab Results: Recent Labs    11/11/20 1844 11/11/20 2039 11/12/20 0315  WBC 18.5*  --  11.3*  HGB 10.2* 8.5* 8.7*  HCT 30.0* 25.0* 25.0*  PLT 184  --  147*   BMET:  Recent Labs    11/11/20 1844 11/11/20 2039 11/12/20 0315  NA 138 143 137  K 5.0 4.3 4.2  CL 111  --  110  CO2 20*  --  21*  GLUCOSE 147*  --  152*  BUN 9  --  8  CREATININE 0.54  --  0.59  CALCIUM 7.5*  --  7.6*    PT/INR:  Recent Labs    11/11/20 1330  LABPROT 18.7*   INR 1.6*   ABG    Component Value Date/Time   PHART 7.323 (L) 11/11/2020 2039   HCO3 19.1 (L) 11/11/2020 2039   TCO2 20 (L) 11/11/2020 2039   ACIDBASEDEF 6.0 (H) 11/11/2020 2039   O2SAT 98.0 11/11/2020 2039   CBG (last 3)  Recent Labs    11/12/20 0407 11/12/20 0502 11/12/20 0653  GLUCAP 153* 142* 149*   CXR: clear  ECG: sinus, no acute changes  Assessment/Plan: S/P Procedure(s) (LRB): CORONARY ARTERY BYPASS GRAFTING (CABG) X  2 ON PUMP USING RIGHT ENDOSCOPIC GREATER SAPHENOUS VEIN CONDUITS (N/A) AORTIC VALVE REPLACEMENT (AVR) WITH INSPIRIS RESILIA AORTIC VALVE SIZE 21MM (N/A) TRANSESOPHAGEAL ECHOCARDIOGRAM (TEE) (N/A) APPLICATION OF CELL SAVER ENDOVEIN HARVEST OF GREATER SAPHENOUS VEIN (Right)  POD 1 Hemodynamically stable but still on low dose neo. Will hold beta blocker for now and wean neo as tolerated..  Hold on diuresis until stable off neo.  CT output decreasing but 100 cc since 3 am so will keep in for now.   DC swan, arterial line  IS, OOB.  Transition insulin drip to SSI and levemir.   LOS: 1 day    Gaye Pollack 11/12/2020

## 2020-11-12 NOTE — Progress Notes (Signed)
Patient ID: Shelley Berg, female   DOB: 1945-01-26, 75 y.o.   MRN: [id number] TCTS Evening Rounds:  Hemodynamically labile today requiring some neo but weaning off again. She has a stiff hypertrophied ventricle.   UO 20/hr. Will start diuresis tomorrow if BP stable.  CT output decreasing.   Sitting up in chair eating.

## 2020-11-13 ENCOUNTER — Inpatient Hospital Stay (HOSPITAL_COMMUNITY): Payer: Medicare Other

## 2020-11-13 LAB — GLUCOSE, CAPILLARY
Glucose-Capillary: 127 mg/dL — ABNORMAL HIGH (ref 70–99)
Glucose-Capillary: 135 mg/dL — ABNORMAL HIGH (ref 70–99)
Glucose-Capillary: 146 mg/dL — ABNORMAL HIGH (ref 70–99)
Glucose-Capillary: 93 mg/dL (ref 70–99)
Glucose-Capillary: 127 mg/dL — ABNORMAL HIGH (ref 70–99)

## 2020-11-13 LAB — CBC
HCT: 20.6 % — ABNORMAL LOW (ref 36.0–46.0)
Hemoglobin: 7.1 g/dL — ABNORMAL LOW (ref 12.0–15.0)
MCH: 31.6 pg (ref 26.0–34.0)
MCHC: 34.5 g/dL (ref 30.0–36.0)
MCV: 91.6 fL (ref 80.0–100.0)
Platelets: 120 10*3/uL — ABNORMAL LOW (ref 150–400)
RBC: 2.25 MIL/uL — ABNORMAL LOW (ref 3.87–5.11)
RDW: 13.2 % (ref 11.5–15.5)
WBC: 13.7 10*3/uL — ABNORMAL HIGH (ref 4.0–10.5)
nRBC: 0 % (ref 0.0–0.2)

## 2020-11-13 LAB — BASIC METABOLIC PANEL
Anion gap: 4 — ABNORMAL LOW (ref 5–15)
BUN: 9 mg/dL (ref 8–23)
CO2: 24 mmol/L (ref 22–32)
Calcium: 7.5 mg/dL — ABNORMAL LOW (ref 8.9–10.3)
Chloride: 100 mmol/L (ref 98–111)
Creatinine, Ser: 0.56 mg/dL (ref 0.44–1.00)
GFR, Estimated: >60 mL/min (ref >60)
Glucose, Bld: 125 mg/dL — ABNORMAL HIGH (ref 70–99)
Potassium: 3.9 mmol/L (ref 3.5–5.1)
Sodium: 128 mmol/L — ABNORMAL LOW (ref 135–145)

## 2020-11-13 LAB — PREPARE RBC (CROSSMATCH)

## 2020-11-13 MED ORDER — FUROSEMIDE 10 MG/ML IJ SOLN
40.0000 mg | Freq: Two times a day (BID) | INTRAMUSCULAR | Status: AC
  Administered 2020-11-13 (×2): 40 mg via INTRAVENOUS
  Filled 2020-11-13 (×2): qty 4

## 2020-11-13 MED ORDER — SODIUM CHLORIDE 0.9% IV SOLUTION: Freq: Once | INTRAVENOUS | Status: AC

## 2020-11-13 MED ORDER — FERROUS SULFATE 300 (60 FE) MG/5ML PO SYRP
220.0000 mg | ORAL_SOLUTION | Freq: Two times a day (BID) | ORAL | Status: DC
  Filled 2020-11-13: qty 5

## 2020-11-13 MED ORDER — FERROUS SULFATE 325 (65 FE) MG PO TABS
325.0000 mg | ORAL_TABLET | Freq: Two times a day (BID) | ORAL | Status: DC
  Administered 2020-11-13 – 2020-11-16 (×7): 325 mg via ORAL
  Filled 2020-11-13 (×6): qty 1

## 2020-11-13 MED ORDER — INSULIN ASPART 100 UNIT/ML IJ SOLN
0.0000 [IU] | Freq: Three times a day (TID) | INTRAMUSCULAR | Status: DC
  Administered 2020-11-13: 2 [IU] via SUBCUTANEOUS

## 2020-11-13 MED ORDER — FERROUS SULFATE 325 (65 FE) MG PO TABS
325.0000 mg | ORAL_TABLET | Freq: Two times a day (BID) | ORAL | Status: DC
  Filled 2020-11-13: qty 1

## 2020-11-13 MED ORDER — POTASSIUM CHLORIDE CRYS ER 20 MEQ PO TBCR
20.0000 meq | EXTENDED_RELEASE_TABLET | Freq: Two times a day (BID) | ORAL | Status: AC
  Administered 2020-11-13 (×2): 20 meq via ORAL
  Filled 2020-11-13 (×2): qty 1

## 2020-11-13 NOTE — Progress Notes (Signed)
Patient ID: Shelley Berg, female   DOB: 08-04-1945, 75 y.o.   MRN: [id number] TCTS Evening Rounds:  Hemodynamically stable in sinus rhythm. Transfused 1 unit cells today and diuresing well.  Feels better and has ambulated around the ICU.

## 2020-11-13 NOTE — Plan of Care (Signed)
  Problem: Education: Goal: Knowledge of General Education information will improve Description: Including pain rating scale, medication(s)/side effects and non-pharmacologic comfort measures Outcome: Progressing   Problem: Health Behavior/Discharge Planning: Goal: Ability to manage health-related needs will improve Outcome: Progressing   Problem: Clinical Measurements: Goal: Ability to maintain clinical measurements within normal limits will improve Outcome: Progressing Goal: Will remain free from infection Outcome: Progressing Goal: Diagnostic test results will improve Outcome: Progressing Goal: Respiratory complications will improve Outcome: Progressing Goal: Cardiovascular complication will be avoided Outcome: Progressing   Problem: Activity: Goal: Risk for activity intolerance will decrease Outcome: Progressing   Problem: Nutrition: Goal: Adequate nutrition will be maintained Outcome: Progressing   Problem: Coping: Goal: Level of anxiety will decrease Outcome: Progressing   Problem: Elimination: Goal: Will not experience complications related to bowel motility Outcome: Progressing Goal: Will not experience complications related to urinary retention Outcome: Progressing   Problem: Pain Managment: Goal: General experience of comfort will improve Outcome: Progressing   Problem: Safety: Goal: Ability to remain free from injury will improve Outcome: Progressing   Problem: Skin Integrity: Goal: Risk for impaired skin integrity will decrease Outcome: Progressing   Problem: Education: Goal: Understanding of CV disease, CV risk reduction, and recovery process will improve Outcome: Progressing Goal: Individualized Educational Video(s) Outcome: Progressing   Problem: Activity: Goal: Ability to return to baseline activity level will improve Outcome: Progressing   Problem: Cardiovascular: Goal: Ability to achieve and maintain adequate cardiovascular perfusion  will improve Outcome: Progressing Goal: Vascular access site(s) Level 0-1 will be maintained Outcome: Progressing   Problem: Health Behavior/Discharge Planning: Goal: Ability to safely manage health-related needs after discharge will improve Outcome: Progressing   Problem: Education: Goal: Will demonstrate proper wound care and an understanding of methods to prevent future damage Outcome: Progressing Goal: Knowledge of disease or condition will improve Outcome: Progressing Goal: Knowledge of the prescribed therapeutic regimen will improve Outcome: Progressing Goal: Individualized Educational Video(s) Outcome: Progressing   Problem: Activity: Goal: Risk for activity intolerance will decrease Outcome: Progressing   Problem: Cardiac: Goal: Will achieve and/or maintain hemodynamic stability Outcome: Progressing   Problem: Clinical Measurements: Goal: Postoperative complications will be avoided or minimized Outcome: Progressing   Problem: Respiratory: Goal: Respiratory status will improve Outcome: Progressing   Problem: Skin Integrity: Goal: Wound healing without signs and symptoms of infection Outcome: Progressing Goal: Risk for impaired skin integrity will decrease Outcome: Progressing   Problem: Urinary Elimination: Goal: Ability to achieve and maintain adequate renal perfusion and functioning will improve Outcome: Progressing   

## 2020-11-13 NOTE — Progress Notes (Signed)
2 Days Post-Op Procedure(s) (LRB): CORONARY ARTERY BYPASS GRAFTING (CABG) X  2 ON PUMP USING RIGHT ENDOSCOPIC GREATER SAPHENOUS VEIN CONDUITS (N/A) AORTIC VALVE REPLACEMENT (AVR) WITH INSPIRIS RESILIA AORTIC VALVE SIZE 21MM (N/A) TRANSESOPHAGEAL ECHOCARDIOGRAM (TEE) (N/A) APPLICATION OF CELL SAVER ENDOVEIN HARVEST OF GREATER SAPHENOUS VEIN (Right) Subjective: No complaints. Ambulated this am. Feels groggy but just got some Tramadol.  Objective: Vital signs in last 24 hours: Temp:  [98.3 F (36.8 C)-99.1 F (37.3 C)] 98.3 F (36.8 C) (11/13 0737) Pulse Rate:  [67-86] 82 (11/13 0700) Cardiac Rhythm: Normal sinus rhythm (11/13 0700) Resp:  [17-40] 20 (11/13 0700) BP: (87-113)/(53-66) 113/59 (11/13 0700) SpO2:  [92 %-100 %] 98 % (11/13 0700) Arterial Line BP: (101-136)/(46-67) 130/54 (11/12 1800) Weight:  [72 kg] 72 kg (11/13 0500)  Hemodynamic parameters for last 24 hours: PAP: (31-47)/(16-31) 38/18  Intake/Output from previous day: 11/12 0701 - 11/13 0700 In: 1227.6 [P.O.:900; I.V.:227.6; IV Piggyback:100] Out: 683 [Urine:508; Chest Tube:175] Intake/Output this shift: No intake/output data recorded.  General appearance: alert and cooperative Neurologic: intact Heart: regular rate and rhythm, S1, S2 normal, no murmur Lungs: clear to auscultation bilaterally Extremities: edema moderate Wound: dressing dry  Lab Results: Recent Labs    11/12/20 1700 11/13/20 0455  WBC 14.5* 13.7*  HGB 7.8* 7.1*  HCT 23.3* 20.6*  PLT 138* 120*   BMET:  Recent Labs    11/12/20 1700 11/13/20 0455  NA 132* 128*  K 3.9 3.9  CL 104 100  CO2 21* 24  GLUCOSE 140* 125*  BUN 11 9  CREATININE 0.60 0.56  CALCIUM 7.8* 7.5*    PT/INR:  Recent Labs    11/11/20 1330  LABPROT 18.7*  INR 1.6*   ABG    Component Value Date/Time   PHART 7.323 (L) 11/11/2020 2039   HCO3 19.1 (L) 11/11/2020 2039   TCO2 20 (L) 11/11/2020 2039   ACIDBASEDEF 6.0 (H) 11/11/2020 2039   O2SAT 98.0  11/11/2020 2039   CBG (last 3)  Recent Labs    11/12/20 1935 11/12/20 2309 11/13/20 0400  GLUCAP 139* 144* 127*   CXR: left base atelectasis  Assessment/Plan: S/P Procedure(s) (LRB): CORONARY ARTERY BYPASS GRAFTING (CABG) X  2 ON PUMP USING RIGHT ENDOSCOPIC GREATER SAPHENOUS VEIN CONDUITS (N/A) AORTIC VALVE REPLACEMENT (AVR) WITH INSPIRIS RESILIA AORTIC VALVE SIZE 21MM (N/A) TRANSESOPHAGEAL ECHOCARDIOGRAM (TEE) (N/A) APPLICATION OF CELL SAVER ENDOVEIN HARVEST OF GREATER SAPHENOUS VEIN (Right)  POD 2 Hemodynamically stable off neo this am but BP on the lower side. Hold off on beta blocker.  Acute postop blood loss anemia: Hgb has drifted down to 7.0 and with borderline BP I will transfuse 1 unit PRBC's. Start iron. Her BSA is only 1.68 so blood volume is small.  Volume excess: Wt is 10 lbs over preop. Will diurese today.   DC chest tubes  IS, ambulation.  Glucose under good control. Switch to ac SSI. May be able to stop tomorrow. Preop Hgb A1c was 6.2 on no meds.   LOS: 2 days    Shelley Berg 11/13/2020

## 2020-11-14 ENCOUNTER — Other Ambulatory Visit: Payer: Self-pay | Admitting: Student

## 2020-11-14 ENCOUNTER — Inpatient Hospital Stay (HOSPITAL_COMMUNITY): Payer: Medicare Other

## 2020-11-14 DIAGNOSIS — Z952 Presence of prosthetic heart valve: Secondary | ICD-10-CM

## 2020-11-14 LAB — CBC
HCT: 27 % — ABNORMAL LOW (ref 36.0–46.0)
Hemoglobin: 9.3 g/dL — ABNORMAL LOW (ref 12.0–15.0)
MCH: 31.7 pg (ref 26.0–34.0)
MCHC: 34.4 g/dL (ref 30.0–36.0)
MCV: 92.2 fL (ref 80.0–100.0)
Platelets: 134 10*3/uL — ABNORMAL LOW (ref 150–400)
RBC: 2.93 MIL/uL — ABNORMAL LOW (ref 3.87–5.11)
RDW: 13.5 % (ref 11.5–15.5)
WBC: 12.4 10*3/uL — ABNORMAL HIGH (ref 4.0–10.5)
nRBC: 0 % (ref 0.0–0.2)

## 2020-11-14 LAB — TYPE AND SCREEN
ABO/RH(D): A POS
Antibody Screen: NEGATIVE
Unit division: 0

## 2020-11-14 LAB — GLUCOSE, CAPILLARY
Glucose-Capillary: 74 mg/dL (ref 70–99)
Glucose-Capillary: 85 mg/dL (ref 70–99)
Glucose-Capillary: 113 mg/dL — ABNORMAL HIGH (ref 70–99)
Glucose-Capillary: 128 mg/dL — ABNORMAL HIGH (ref 70–99)

## 2020-11-14 LAB — BASIC METABOLIC PANEL
Anion gap: 6 (ref 5–15)
BUN: 10 mg/dL (ref 8–23)
CO2: 21 mmol/L — ABNORMAL LOW (ref 22–32)
Calcium: 7.6 mg/dL — ABNORMAL LOW (ref 8.9–10.3)
Chloride: 108 mmol/L (ref 98–111)
Creatinine, Ser: 0.52 mg/dL (ref 0.44–1.00)
GFR, Estimated: >60 mL/min (ref >60)
Glucose, Bld: 101 mg/dL — ABNORMAL HIGH (ref 70–99)
Potassium: 3.7 mmol/L (ref 3.5–5.1)
Sodium: 135 mmol/L (ref 135–145)

## 2020-11-14 LAB — BPAM RBC
Blood Product Expiration Date: 202212132359
ISSUE DATE / TIME: 202211130802
Unit Type and Rh: 6200

## 2020-11-14 LAB — SURGICAL PATHOLOGY

## 2020-11-14 MED ORDER — PANTOPRAZOLE SODIUM 40 MG PO TBEC
40.0000 mg | DELAYED_RELEASE_TABLET | Freq: Every day | ORAL | Status: DC
  Administered 2020-11-14 – 2020-11-16 (×3): 40 mg via ORAL
  Filled 2020-11-14 (×3): qty 1

## 2020-11-14 MED ORDER — METOLAZONE 2.5 MG PO TABS
2.5000 mg | ORAL_TABLET | Freq: Every day | ORAL | Status: AC
  Administered 2020-11-14 – 2020-11-15 (×2): 2.5 mg via ORAL
  Filled 2020-11-14 (×2): qty 1

## 2020-11-14 MED ORDER — OXYCODONE HCL 5 MG PO TABS: 5.0000 mg | ORAL_TABLET | ORAL | Status: DC | PRN

## 2020-11-14 MED ORDER — POTASSIUM CHLORIDE CRYS ER 20 MEQ PO TBCR
40.0000 meq | EXTENDED_RELEASE_TABLET | Freq: Once | ORAL | Status: AC
  Administered 2020-11-14: 40 meq via ORAL
  Filled 2020-11-14: qty 2

## 2020-11-14 MED ORDER — SODIUM CHLORIDE 0.9% FLUSH
3.0000 mL | Freq: Two times a day (BID) | INTRAVENOUS | Status: DC
  Administered 2020-11-14: 3 mL via INTRAVENOUS
  Administered 2020-11-14: 10 mL via INTRAVENOUS
  Administered 2020-11-15 (×2): 3 mL via INTRAVENOUS

## 2020-11-14 MED ORDER — ACETAMINOPHEN 325 MG PO TABS
650.0000 mg | ORAL_TABLET | Freq: Four times a day (QID) | ORAL | Status: DC | PRN
  Administered 2020-11-15 – 2020-11-16 (×2): 650 mg via ORAL
  Filled 2020-11-14 (×2): qty 2

## 2020-11-14 MED ORDER — SODIUM CHLORIDE 0.9% FLUSH: 3.0000 mL | INTRAVENOUS | Status: DC | PRN

## 2020-11-14 MED ORDER — CHLORHEXIDINE GLUCONATE CLOTH 2 % EX PADS
6.0000 | MEDICATED_PAD | Freq: Every day | CUTANEOUS | Status: DC
  Administered 2020-11-14 – 2020-11-15 (×2): 6 via TOPICAL

## 2020-11-14 MED ORDER — SODIUM CHLORIDE 0.9 % IV SOLN: 250.0000 mL | INTRAVENOUS | Status: DC | PRN

## 2020-11-14 MED ORDER — METOPROLOL TARTRATE 12.5 MG HALF TABLET
12.5000 mg | ORAL_TABLET | Freq: Two times a day (BID) | ORAL | Status: DC
  Administered 2020-11-14 – 2020-11-16 (×4): 12.5 mg via ORAL
  Filled 2020-11-14 (×4): qty 1

## 2020-11-14 MED ORDER — ONDANSETRON HCL 4 MG PO TABS: 4.0000 mg | ORAL_TABLET | Freq: Four times a day (QID) | ORAL | Status: DC | PRN

## 2020-11-14 MED ORDER — ASPIRIN EC 325 MG PO TBEC
325.0000 mg | DELAYED_RELEASE_TABLET | Freq: Every day | ORAL | Status: DC
  Administered 2020-11-14 – 2020-11-16 (×3): 325 mg via ORAL
  Filled 2020-11-14 (×3): qty 1

## 2020-11-14 MED ORDER — ~~LOC~~ CARDIAC SURGERY, PATIENT & FAMILY EDUCATION: Freq: Once | Status: AC

## 2020-11-14 MED ORDER — TRAMADOL HCL 50 MG PO TABS
50.0000 mg | ORAL_TABLET | ORAL | Status: DC | PRN
  Administered 2020-11-14 – 2020-11-15 (×2): 50 mg via ORAL
  Filled 2020-11-14 (×2): qty 1

## 2020-11-14 MED ORDER — POTASSIUM CHLORIDE CRYS ER 20 MEQ PO TBCR
20.0000 meq | EXTENDED_RELEASE_TABLET | Freq: Two times a day (BID) | ORAL | Status: AC
  Administered 2020-11-14 (×2): 20 meq via ORAL
  Filled 2020-11-14 (×2): qty 1

## 2020-11-14 MED ORDER — INSULIN ASPART 100 UNIT/ML IJ SOLN
0.0000 [IU] | Freq: Three times a day (TID) | INTRAMUSCULAR | Status: DC
  Administered 2020-11-15 – 2020-11-16 (×2): 2 [IU] via SUBCUTANEOUS

## 2020-11-14 MED ORDER — FUROSEMIDE 40 MG PO TABS
40.0000 mg | ORAL_TABLET | Freq: Every day | ORAL | Status: DC
  Administered 2020-11-14 – 2020-11-15 (×2): 40 mg via ORAL
  Filled 2020-11-14 (×2): qty 1

## 2020-11-14 MED ORDER — ONDANSETRON HCL 4 MG/2ML IJ SOLN: 4.0000 mg | Freq: Four times a day (QID) | INTRAMUSCULAR | Status: DC | PRN

## 2020-11-14 MED ORDER — SENNOSIDES-DOCUSATE SODIUM 8.6-50 MG PO TABS
1.0000 | ORAL_TABLET | Freq: Two times a day (BID) | ORAL | Status: DC | PRN
  Administered 2020-11-16: 1 via ORAL
  Filled 2020-11-14: qty 1

## 2020-11-14 NOTE — Progress Notes (Signed)
CARDIAC REHAB PHASE I   PRE:  Rate/Rhythm: 112 ST, 74 SR with coughing    BP: sitting 137/41    SaO2: 96 RA  MODE:  Ambulation: 370 ft   POST:  Rate/Rhythm: 136 ST    BP: sitting 125/86     SaO2: 97 RA  Pt eager to ambulate. While preparing to ambulate pt with light cough and HR slowed to 70s. Back up to 110s. Ambulated with stand by assist, HR slowly increased to 136 ST. Pt c/o SOB at 136 and rested briefly. No other c/o, tolerated well. Return to recliner.  [place], ACSM 11/14/2020 3:30 PM

## 2020-11-14 NOTE — Progress Notes (Signed)
Ordered routine 6 week post-op Echo following AVR per protocol.  Primary Cardiologist is Dr. Garen Lah. CT Surgeon is Dr. Cyndia Bent.  Darreld Mclean, PA-C 11/14/2020 12:13 PM

## 2020-11-14 NOTE — Progress Notes (Signed)
3 Days Post-Op Procedure(s) (LRB): CORONARY ARTERY BYPASS GRAFTING (CABG) X  2 ON PUMP USING RIGHT ENDOSCOPIC GREATER SAPHENOUS VEIN CONDUITS (N/A) AORTIC VALVE REPLACEMENT (AVR) WITH INSPIRIS RESILIA AORTIC VALVE SIZE 21MM (N/A) TRANSESOPHAGEAL ECHOCARDIOGRAM (TEE) (N/A) APPLICATION OF CELL SAVER ENDOVEIN HARVEST OF GREATER SAPHENOUS VEIN (Right) Subjective: No complaints. Ambulated this am. Bowels working.  Objective: Vital signs in last 24 hours: Temp:  [98.3 F (36.8 C)-98.8 F (37.1 C)] 98.8 F (37.1 C) (11/14 0400) Pulse Rate:  [59-97] 97 (11/14 0600) Cardiac Rhythm: Normal sinus rhythm (11/14 0400) Resp:  [15-35] 33 (11/14 0600) BP: (93-138)/(57-76) 115/72 (11/14 0600) SpO2:  [95 %-100 %] 95 % (11/14 0600) Weight:  [71.3 kg] 71.3 kg (11/14 0500)  Hemodynamic parameters for last 24 hours:    Intake/Output from previous day: 11/13 0701 - 11/14 0700 In: 1713.4 [P.O.:1200; I.V.:13; Blood:479; IV Piggyback:21.4] Out: 9622 [Urine:3445] Intake/Output this shift: Total I/O In: 240 [P.O.:240] Out: 655 [Urine:655]  General appearance: alert and cooperative Neurologic: intact Heart: regular rate and rhythm, S1, S2 normal, no murmur, click, rub or gallop Lungs: clear to auscultation bilaterally Extremities: edema moderate Wound: incision ok  Lab Results: Recent Labs    11/13/20 0455 11/14/20 0409  WBC 13.7* 12.4*  HGB 7.1* 9.3*  HCT 20.6* 27.0*  PLT 120* 134*   BMET:  Recent Labs    11/13/20 0455 11/14/20 0409  NA 128* 135  K 3.9 3.7  CL 100 108  CO2 24 21*  GLUCOSE 125* 101*  BUN 9 10  CREATININE 0.56 0.52  CALCIUM 7.5* 7.6*    PT/INR:  Recent Labs    11/11/20 1330  LABPROT 18.7*  INR 1.6*   ABG    Component Value Date/Time   PHART 7.323 (L) 11/11/2020 2039   HCO3 19.1 (L) 11/11/2020 2039   TCO2 20 (L) 11/11/2020 2039   ACIDBASEDEF 6.0 (H) 11/11/2020 2039   O2SAT 98.0 11/11/2020 2039   CBG (last 3)  Recent Labs    11/13/20 1551  11/13/20 2141 11/14/20 0640  GLUCAP 93 127* 74   CXR: stable left base atlelectasis  Assessment/Plan: S/P Procedure(s) (LRB): CORONARY ARTERY BYPASS GRAFTING (CABG) X  2 ON PUMP USING RIGHT ENDOSCOPIC GREATER SAPHENOUS VEIN CONDUITS (N/A) AORTIC VALVE REPLACEMENT (AVR) WITH INSPIRIS RESILIA AORTIC VALVE SIZE 21MM (N/A) TRANSESOPHAGEAL ECHOCARDIOGRAM (TEE) (N/A) APPLICATION OF CELL SAVER ENDOVEIN HARVEST OF GREATER SAPHENOUS VEIN (Right)  POD 3 Hemodynamically stable in sinus rhythm. BP has been low 100's so will hold off on beta blocker for now.  Volume excess: -1700 cc yesterday  and wt down about 2 lbs. Still about 8 lbs over preop and has edema. Will continue diuresis and KCL replacement.  Acute postop blood loss anemia: Hgb improved with transfusion. Continue iron.  DM: glucose under good control. Will continue SSI for now.  DC foley and sleeve. Transfer to 4E and continue IS, ambulation.   LOS: 3 days    Gaye Pollack 11/14/2020

## 2020-11-14 NOTE — Progress Notes (Addendum)
EVENING ROUNDS NOTE :     [address]       [place] [address]             [phone]                 3 Days Post-Op Procedure(s) (LRB): CORONARY ARTERY BYPASS GRAFTING (CABG) X  2 ON PUMP USING RIGHT ENDOSCOPIC GREATER SAPHENOUS VEIN CONDUITS (N/A) AORTIC VALVE REPLACEMENT (AVR) WITH INSPIRIS RESILIA AORTIC VALVE SIZE 21MM (N/A) TRANSESOPHAGEAL ECHOCARDIOGRAM (TEE) (N/A) APPLICATION OF CELL SAVER ENDOVEIN HARVEST OF GREATER SAPHENOUS VEIN (Right)   Total Length of Stay:  LOS: 3 days  Events:   No events Tachy, HTN, will start low dose BB Comfortable Awaiting floor bed   BP 118/73   Pulse (!) 118   Temp 98.3 F (36.8 C)   Resp (!) 33   Ht 4\' 11"  (1.499 m)   Wt 71.3 kg   SpO2 97%   BMI 31.75 kg/m          sodium chloride      I/O last 3 completed shifts: In: 1952.1 [P.O.:1260; I.V.:13; Blood:479; IV Piggyback:200.1] Out: 4332 [Urine:3773; Chest Tube:105]   CBC Latest Ref Rng & Units 11/14/2020 11/13/2020 11/12/2020  WBC 4.0 - 10.5 K/uL 12.4(H) 13.7(H) 14.5(H)  Hemoglobin 12.0 - 15.0 g/dL 9.3(L) 7.1(L) 7.8(L)  Hematocrit 36.0 - 46.0 % 27.0(L) 20.6(L) 23.3(L)  Platelets 150 - 400 K/uL 134(L) 120(L) 138(L)    BMP Latest Ref Rng & Units 11/14/2020 11/13/2020 11/12/2020  Glucose 70 - 99 mg/dL 101(H) 125(H) 140(H)  BUN 8 - 23 mg/dL 10 9 11   Creatinine 0.44 - 1.00 mg/dL 0.52 0.56 0.60  BUN/Creat Ratio 12 - 28 - - -  Sodium 135 - 145 mmol/L 135 128(L) 132(L)  Potassium 3.5 - 5.1 mmol/L 3.7 3.9 3.9  Chloride 98 - 111 mmol/L 108 100 104  CO2 22 - 32 mmol/L 21(L) 24 21(L)  Calcium 8.9 - 10.3 mg/dL 7.6(L) 7.5(L) 7.8(L)    ABG    Component Value Date/Time   PHART 7.323 (L) 11/11/2020 2039   PCO2ART 36.7 11/11/2020 2039   PO2ART 120 (H) 11/11/2020 2039   HCO3 19.1 (L) 11/11/2020 2039   TCO2 20 (L) 11/11/2020 2039   ACIDBASEDEF 6.0 (H) 11/11/2020 2039   O2SAT 98.0 11/11/2020 2039       Melodie Bouillon, MD 11/14/2020 5:59 PM

## 2020-11-14 NOTE — Discharge Summary (Signed)
[address]       [place] [address]             [phone]    Physician Discharge Summary  Patient ID: PATRICE MATTHEW MRN: [id number] DOB/AGE: 09-Apr-1945 75 y.o.  Admit date: 11/11/2020 Discharge date: 11/16/2020  Admission Diagnoses:  Patient Active Problem List   Diagnosis Date Noted   Seronegative rheumatoid arthritis ([place]) 09/28/2020   Osteoarthritis 09/28/2020   Hypertension 09/12/2020   Hyperlipidemia 09/12/2020   CAD (coronary artery disease)    Nonrheumatic aortic valve stenosis    Discharge Diagnoses:  Patient Active Problem List   Diagnosis Date Noted   S/P CABG x 2 11/11/2020   S/P AVR (aortic valve replacement) 11/11/2020   Seronegative rheumatoid arthritis ([place]) 09/28/2020   Osteoarthritis 09/28/2020   Hypertension 09/12/2020   Hyperlipidemia 09/12/2020   CAD (coronary artery disease)    Nonrheumatic aortic valve stenosis    Discharged Condition: good  History of Present Illness:  The patient is a 75 year old woman with a history of hypertension, hyperlipidemia, and rheumatoid arthritis on methotrexate for the past few years who was evaluated for a heart murmur and exertional substernal chest discomfort.  A 2D echocardiogram on 05/06/2020 showed a thickened and calcified aortic valve with mean gradient of 28 mmHg and a peak gradient of 47 mmHg.  Dimensionless index was 0.27 consistent with moderate aortic stenosis.  Aortic valve area by VTI was 0.7 cm.  Stroke-volume was 54 cc.  Left ventricular ejection fraction was 70 to 75% with grade 1 diastolic dysfunction.  There is mild mitral valve regurgitation.  She subsequent underwent a nuclear stress test on 07/28/2020 showing mild inferolateral hypokinesis with an ejection fraction of 52%.  There was a moderate size region of ischemia noted in the inferolateral wall.  There were no electrocardiogram changes.  This was a high risk scan.  She underwent cardiac catheterization on 08/10/2020  showing significant three-vessel coronary disease.  The left circumflex had an 85% mid vessel stenosis compromising a large marginal branch.  The LAD has 60% proximal and 40% mid vessel stenosis.  The right coronary was occluded with filling of the distal vessel by collaterals.  A peak to peak gradient across aortic valve was 42 mmHg and the mean gradient was 25 mmHg with a valve area of 1 cm.  She was evaluated by Dr. Cyndia Bent at which time the patient reported exertional substernal chest discomfort and shortness of breath with exertion such as walking up steps or an incline.  She has had no symptoms at rest.  She did not think much about the symptoms prior to being told that she had aortic stenosis and coronary disease.  She denies any dizziness or syncope.  She has had no peripheral edema.  It was felt the patient should undergo Coronary bypass grafting with replacement of her Aortic Valve.  The risks and benefits of the procedure were explained to the patient and she was agreeable to proceed.  Hospital Course:  The patient presented to [facility] on 11/11/2020.  She was taken to the operating room and underwent CABG x 2 utilizing SVG to OM and SVG to PDA, Aortic Valve Replacement with a 21 mm Edwards Resilia Bioprosthetic Valve, and endoscopic vein harvest from her right leg.  She tolerated the procedure without difficulty and was taken to the SICU in stable condition.  She was extubated the evening of surgery.   Patient has remained hemodynamically stable but  has required low-dose Neo-Synephrine.  This was weaned by postop day #2.  Blood pressure has been somewhat on the lower side so beta-blocker had not been initiated.  She is noted to have an acute postop blood loss anemia and hemoglobin did drift below 7 and she subsequently has been transfused as well as started on iron.  She does have postoperative volume overload and is being diuresed.  Chest tubes were removed on postop day #2.  She is  started on a course of routine cardiac rehab and pulmonary hygiene.  Her blood sugars have been under adequate control using standard treatments.  Her hemoglobin A1c was 6.2 preoperatively on no medications.  She is maintaining NSR and felt stable for transfer to the progressive care unit on 11/14/2020 however a bed was not available.  She continues to maintain NSR.  Her pacing wires were removed without difficulty.  She is participating with cardiac rehabilitation without difficulty.  Her surgical incisions are healing without evidence of infection.  She is medically stable for discharge home today.  Consults: None  Significant Diagnostic Studies: cardiac graphics:    Echocardiogram:   IMPRESSIONS    1. Left ventricular ejection fraction, by estimation, is 70 to 75%. The  left ventricle has hyperdynamic function. Left ventricular diastolic  parameters are consistent with Grade I diastolic dysfunction (impaired  relaxation). Elevated left atrial  pressure.   2. Right ventricular systolic function is normal. The right ventricular  size is normal. There is normal pulmonary artery systolic pressure.   3. Left atrial size was mildly dilated.   4. Mild mitral valve regurgitation.   5. AV is difficult to see. It is thickened, calcified. Peak and mean  gradients through the valve are 47 and 28 mm Hg respectively Dimensionless  index is 0.3 consistent with moderate severe \ AS. Marland Kitchen The aortic valve was  not well visualized. Aortic valve  regurgitation is not visualized.   6. The inferior vena cava is normal in size with greater than 50%  respiratory variability, suggesting right atrial pressure of 3 mmHg.   7. Aortic dilatation noted. There is mild dilatation of the ascending  aorta, measuring 42 mm.    Catheterization:   Prox LAD lesion is 60% stenosed.   Mid LAD lesion is 40% stenosed.   Ost Cx to Prox Cx lesion is 30% stenosed.   Mid Cx lesion is 85% stenosed.   Mid RCA lesion is 100%  stenosed.   1.  Significant calcified three-vessel coronary artery disease.  Chronically occluded mid right coronary artery with collaterals.  LAD disease is borderline. 2.  Left ventricular angiography was not performed.  EF was normal by echo. Right heart catheterization was performed due to presence of aortic stenosis.  It showed normal right and left-sided filling pressures, mild pulmonary hypertension and normal cardiac output. 3.  Moderate to severe aortic stenosis with peak to peak gradient of 42 mmHg and valve area of 1.00 cm.  Treatments: surgery:   11/11/2020   Surgeon:  Gaye Pollack, MD   First Assistant: Ellwood Handler,  PA-C:   An experienced assistant was required given the complexity of this surgery and the standard of surgical care. The assistant was needed for saphenous vein harvesting, exposure, dissection, suctioning, retraction of delicate tissues and sutures, instrument exchange and for overall help during this procedure.     Preoperative Diagnosis:  Severe multi-vessel coronary artery disease and severe aortic stenosis.     Postoperative Diagnosis:  Same  Procedure:   Median Sternotomy Extracorporeal circulation 3.   Coronary artery bypass grafting x 2   SVG to OM SVG to PDA     4.   Endoscopic vein harvest from the rignt leg 5.   Aortic valve replacement using a 21 mm Edwards INSPIRIS RESILIA pericardial valve.     Discharge Exam: Blood pressure 113/81, pulse 100, temperature 98.4 F (36.9 C), resp. rate (!) 23, height 4\' 11"  (1.499 m), weight 65.9 kg, SpO2 95 %.  General appearance: alert and cooperative Neurologic: intact Heart: regular rate and rhythm, S1, S2 normal, no murmur Lungs: clear to auscultation bilaterally Extremities: no edema Wound: incisions ok  Discharge Medications:  The patient has been discharged on:   1.Beta Blocker:  Yes Blue.Reese   ]                              No   [   ]                              If No,  reason:  2.Ace Inhibitor/ARB: Yes [   ]                                     No  [  X  ]                                     If No, reason: labile BP  3.Statin:   Yes [ X  ]                  No  [   ]                  If No, reason:  4.Ecasa:  Yes  [  X ]                  No   [   ]                  If No, reason:  Patient had ACS upon admission:  Plavix/P2Y12 inhibitor: Yes [   ]                                      No  [ X  ]     Discharge Instructions     Amb Referral to Cardiac Rehabilitation   Complete by: As directed    To [facility]   Diagnosis:  CABG Valve Replacement     Valve: Aortic   CABG X ___: 2   After initial evaluation and assessments completed: Virtual Based Care may be provided alone or in conjunction with Phase 2 Cardiac Rehab based on patient barriers.: Yes   Discharge patient   Complete by: As directed    Discharge disposition: 01-Home or Self Care   Discharge patient date: 11/16/2020      Allergies as of 11/16/2020       Reactions   Penicillins Rash        Medication List     STOP taking these medications    lisinopril 20 MG tablet Commonly  known as: ZESTRIL   methotrexate 2.5 MG tablet Commonly known as: RHEUMATREX   nitroGLYCERIN 0.4 MG SL tablet Commonly known as: NITROSTAT       TAKE these medications    acetaminophen 500 MG tablet Commonly known as: TYLENOL Take 1,000 mg by mouth every 6 (six) hours as needed for moderate pain.   aspirin 325 MG EC tablet Take 1 tablet (325 mg total) by mouth daily. What changed:  medication strength how much to take when to take this additional instructions   atorvastatin 80 MG tablet Commonly known as: LIPITOR Take 1 tablet (80 mg total) by mouth daily.   CALCIUM 600+D3 PO Take 1 tablet by mouth in the morning.   ferrous sulfate 325 (65 FE) MG tablet Take 1 tablet (325 mg total) by mouth 2 (two) times daily with a meal.   folic acid 1 MG tablet Commonly known as:  FOLVITE Take 1 mg by mouth in the morning.   hydroxychloroquine 200 MG tablet Commonly known as: PLAQUENIL Take 400 mg by mouth in the morning.   loratadine 10 MG tablet Commonly known as: CLARITIN Take 10 mg by mouth in the morning.   metoprolol tartrate 25 MG tablet Commonly known as: LOPRESSOR Take 0.5 tablets (12.5 mg total) by mouth 2 (two) times daily.   Omega-3 1000 MG Caps Take 1,000 mg by mouth in the morning.   omeprazole 20 MG capsule Commonly known as: PRILOSEC Take 20 mg by mouth in the morning.   oxyCODONE 5 MG immediate release tablet Commonly known as: Oxy IR/ROXICODONE Take 1 tablet (5 mg total) by mouth every 6 (six) hours as needed for up to 7 days for severe pain.   Vitamin D3 125 MCG (5000 UT) Tabs Take 5,000 Units by mouth.        Follow-up Information     [facility] [place] Follow up on 12/14/2020.   Specialty: Cardiothoracic Surgery Why: Appointment is at 9:00, please get CXR at 8:30 at [facility] located on first floor of our office building Contact information: [place], [place] [phone]        [facility] Follow up on 11/21/2020.   Specialty: Cardiothoracic Surgery Why: Appointment is at 10:30 for suture removal Contact information: [place], [place] [phone]                Signed: John Giovanni, PA-C  11/16/2020, 9:01 AM

## 2020-11-15 LAB — BASIC METABOLIC PANEL
Anion gap: 8 (ref 5–15)
BUN: 11 mg/dL (ref 8–23)
CO2: 24 mmol/L (ref 22–32)
Calcium: 8.6 mg/dL — ABNORMAL LOW (ref 8.9–10.3)
Chloride: 103 mmol/L (ref 98–111)
Creatinine, Ser: 0.71 mg/dL (ref 0.44–1.00)
GFR, Estimated: >60 mL/min (ref >60)
Glucose, Bld: 131 mg/dL — ABNORMAL HIGH (ref 70–99)
Potassium: 4.1 mmol/L (ref 3.5–5.1)
Sodium: 135 mmol/L (ref 135–145)

## 2020-11-15 LAB — GLUCOSE, CAPILLARY
Glucose-Capillary: 108 mg/dL — ABNORMAL HIGH (ref 70–99)
Glucose-Capillary: 94 mg/dL (ref 70–99)
Glucose-Capillary: 105 mg/dL — ABNORMAL HIGH (ref 70–99)
Glucose-Capillary: 136 mg/dL — ABNORMAL HIGH (ref 70–99)

## 2020-11-15 NOTE — Progress Notes (Signed)
Epicardial pacing wires removed at 1605 per protocol. Patient tolerated the removal well. Patient remained on bed rest for one hour. Vital signs documented every 15 minutes for one hour. Patient has no complaints of pain or adverse signs and symptoms.

## 2020-11-15 NOTE — Progress Notes (Addendum)
[facility] DAILY ICU PROGRESS NOTE                   [address]            [place] [address]          [phone]   4 Days Post-Op Procedure(s) (LRB): CORONARY ARTERY BYPASS GRAFTING (CABG) X  2 ON PUMP USING RIGHT ENDOSCOPIC GREATER SAPHENOUS VEIN CONDUITS (N/A) AORTIC VALVE REPLACEMENT (AVR) WITH INSPIRIS RESILIA AORTIC VALVE SIZE 21MM (N/A) TRANSESOPHAGEAL ECHOCARDIOGRAM (TEE) (N/A) APPLICATION OF CELL SAVER ENDOVEIN HARVEST OF GREATER SAPHENOUS VEIN (Right)  Total Length of Stay:  LOS: 4 days   Subjective: Patient already walked this am. She has no specific complaint  Objective: Vital signs in last 24 hours: Temp:  [97.9 F (36.6 C)-99.1 F (37.3 C)] 98.6 F (37 C) (11/15 0700) Pulse Rate:  [66-119] 102 (11/15 0700) Cardiac Rhythm: Normal sinus rhythm (11/15 0000) Resp:  [22-40] 40 (11/15 0700) BP: (103-143)/(63-88) 112/79 (11/15 0700) SpO2:  [94 %-100 %] 96 % (11/15 0700) Weight:  [68.9 kg] 68.9 kg (11/15 0500)  Filed Weights   11/13/20 0500 11/14/20 0500 11/15/20 0500  Weight: 72 kg 71.3 kg 68.9 kg    Weight change: -2.404 kg      Intake/Output from previous day: 11/14 0701 - 11/15 0700 In: 720 [P.O.:720] Out: -   Intake/Output this shift: No intake/output data recorded.  Current Meds: Scheduled Meds:  aspirin EC  325 mg Oral Daily   atorvastatin  80 mg Oral Daily   Chlorhexidine Gluconate Cloth  6 each Topical Daily   enoxaparin (LOVENOX) injection  40 mg Subcutaneous QHS   ferrous sulfate  325 mg Oral BID WC   folic acid  1 mg Oral q AM   furosemide  40 mg Oral Daily   insulin aspart  0-24 Units Subcutaneous TID AC & HS   loratadine  10 mg Oral q AM   mouth rinse  15 mL Mouth Rinse BID   metolazone  2.5 mg Oral Daily   metoprolol tartrate  12.5 mg Oral BID   pantoprazole  40 mg Oral QAC breakfast   sodium chloride flush  3 mL Intravenous Q12H   Continuous Infusions:  sodium chloride     PRN Meds:.sodium chloride,  acetaminophen, ondansetron **OR** ondansetron (ZOFRAN) IV, oxyCODONE, senna-docusate, sodium chloride flush, traMADol  General appearance: alert, cooperative, and no distress Heart: RRR Lungs: Slightly diminished bibasilar breath sounds Abdomen: Soft, non tender, bowel sounds present Extremities: Trace LE edema Wound: Sternal and RLE wounds are clean and dry.  Lab Results: CBC: Recent Labs    11/13/20 0455 11/14/20 0409  WBC 13.7* 12.4*  HGB 7.1* 9.3*  HCT 20.6* 27.0*  PLT 120* 134*   BMET:  Recent Labs    11/14/20 0409 11/15/20 0045  NA 135 135  K 3.7 4.1  CL 108 103  CO2 21* 24  GLUCOSE 101* 131*  BUN 10 11  CREATININE 0.52 0.71  CALCIUM 7.6* 8.6*    CMET: Lab Results  Component Value Date   WBC 12.4 (H) 11/14/2020   HGB 9.3 (L) 11/14/2020   HCT 27.0 (L) 11/14/2020   PLT 134 (L) 11/14/2020   GLUCOSE 131 (H) 11/15/2020   CHOL 163 08/16/2020   TRIG 128 08/16/2020   HDL 60 08/16/2020   LDLCALC 77 08/16/2020   ALT 22 11/09/2020   AST 27 11/09/2020   NA 135 11/15/2020   K 4.1 11/15/2020   CL  103 11/15/2020   CREATININE 0.71 11/15/2020   BUN 11 11/15/2020   CO2 24 11/15/2020   INR 1.6 (H) 11/11/2020   HGBA1C 6.2 (H) 11/09/2020      PT/INR: No results for input(s): LABPROT, INR in the last 72 hours. Radiology: No results found.   Assessment/Plan: S/P Procedure(s) (LRB): CORONARY ARTERY BYPASS GRAFTING (CABG) X  2 ON PUMP USING RIGHT ENDOSCOPIC GREATER SAPHENOUS VEIN CONDUITS (N/A) AORTIC VALVE REPLACEMENT (AVR) WITH INSPIRIS RESILIA AORTIC VALVE SIZE 21MM (N/A) TRANSESOPHAGEAL ECHOCARDIOGRAM (TEE) (N/A) APPLICATION OF CELL SAVER ENDOVEIN HARVEST OF GREATER SAPHENOUS VEIN (Right) CV-ST with HR in the low 100's this am. SBP in the 110's this am. On Lopressor 12.5 mg bid. Pulmonary-on room air. Check CXR in am. Encourage incentive spirometer. Volume overload-on Lasix 40 mg daily Expected post op blood loss anemia-Last H and H stable at 9.3 and 27.  Continue oral Ferrous 5. CBGs 113/128/108. Pre op HGA1C 6.2. She likely has pre diabetes. Will stop accu checks and SS PRN upon transfer. Will provide nutritional information with discharge paperwork. 6. History of RA-Methortrexate will be restarted in several weeks after discharge 7. Remove EPW 8. Awaiting bed for transfer    Nani Skillern [facility] 11/15/2020 7:23 AM    Chart reviewed, patient examined, agree with above. She looks good and has ambulated well today. Resting sinus tachycardia 110. Lopressor to start tonight since BP is better. If she feels well tomorrow and no changes then she can go home tomorrow.

## 2020-11-15 NOTE — Discharge Instructions (Signed)

## 2020-11-15 NOTE — Progress Notes (Signed)
CARDIAC REHAB PHASE I   PRE:  Rate/Rhythm: 88 SR    BP: sitting 117/79    SaO2: 97 RA  MODE:  Ambulation: 1110 ft   POST:  Rate/Rhythm: 109 ST    BP: sitting 133/72     SaO2: 98 RA  Pt stood independently and walked unassisted 3 laps. No c/o. HR stable today. To bed, which is difficult for her due to short stature. No c/o. Phil Campbell, ACSM 11/15/2020 1:35 PM

## 2020-11-16 ENCOUNTER — Inpatient Hospital Stay (HOSPITAL_COMMUNITY): Payer: Medicare Other

## 2020-11-16 LAB — CBC
HCT: 32.2 % — ABNORMAL LOW (ref 36.0–46.0)
Hemoglobin: 10.9 g/dL — ABNORMAL LOW (ref 12.0–15.0)
MCH: 30.8 pg (ref 26.0–34.0)
MCHC: 33.9 g/dL (ref 30.0–36.0)
MCV: 91 fL (ref 80.0–100.0)
Platelets: 256 10*3/uL (ref 150–400)
RBC: 3.54 MIL/uL — ABNORMAL LOW (ref 3.87–5.11)
RDW: 13.2 % (ref 11.5–15.5)
WBC: 12.2 10*3/uL — ABNORMAL HIGH (ref 4.0–10.5)
nRBC: 0 % (ref 0.0–0.2)

## 2020-11-16 LAB — GLUCOSE, CAPILLARY
Glucose-Capillary: 122 mg/dL — ABNORMAL HIGH (ref 70–99)
Glucose-Capillary: 100 mg/dL — ABNORMAL HIGH (ref 70–99)

## 2020-11-16 MED ORDER — FERROUS SULFATE 325 (65 FE) MG PO TABS
325.0000 mg | ORAL_TABLET | Freq: Two times a day (BID) | ORAL | 3 refills | Status: AC
Start: 2020-11-16 — End: ?

## 2020-11-16 MED ORDER — METOPROLOL TARTRATE 25 MG PO TABS: 12.5000 mg | ORAL_TABLET | Freq: Two times a day (BID) | ORAL | 1 refills | Status: DC

## 2020-11-16 MED ORDER — OXYCODONE HCL 5 MG PO TABS
5.0000 mg | ORAL_TABLET | Freq: Four times a day (QID) | ORAL | 0 refills | Status: AC | PRN
Start: 2020-11-16 — End: 2020-11-23

## 2020-11-16 MED ORDER — ASPIRIN 325 MG PO TBEC: 325.0000 mg | DELAYED_RELEASE_TABLET | Freq: Every day | ORAL | Status: DC

## 2020-11-16 MED FILL — Lidocaine HCl Local Preservative Free (PF) Inj 2%: INTRAMUSCULAR | Qty: 15 | Status: AC

## 2020-11-16 MED FILL — Heparin Sodium (Porcine) Inj 1000 Unit/ML: Qty: 1000 | Status: AC

## 2020-11-16 MED FILL — Potassium Chloride Inj 2 mEq/ML: INTRAVENOUS | Qty: 40 | Status: AC

## 2020-11-16 NOTE — Progress Notes (Signed)
CARDIAC REHAB PHASE I   Pt ambulated 3 laps this am. Doing well. Discussed IS, sternal precautions, diet including elevated a1c, exercise, and CRPII. Pt voiced understanding. Will refer to [facility].  [place], ACSM 11/16/2020 8:50 AM

## 2020-11-16 NOTE — Progress Notes (Signed)
5 Days Post-Op Procedure(s) (LRB): CORONARY ARTERY BYPASS GRAFTING (CABG) X  2 ON PUMP USING RIGHT ENDOSCOPIC GREATER SAPHENOUS VEIN CONDUITS (N/A) AORTIC VALVE REPLACEMENT (AVR) WITH INSPIRIS RESILIA AORTIC VALVE SIZE 21MM (N/A) TRANSESOPHAGEAL ECHOCARDIOGRAM (TEE) (N/A) APPLICATION OF CELL SAVER ENDOVEIN HARVEST OF GREATER SAPHENOUS VEIN (Right) Subjective: No complaints. Feels well and wants to go home. Ambulated this am.  Objective: Vital signs in last 24 hours: Temp:  [97.8 F (36.6 C)-98.9 F (37.2 C)] 98.4 F (36.9 C) (11/16 0800) Pulse Rate:  [73-114] 100 (11/16 0800) Cardiac Rhythm: Normal sinus rhythm (11/16 0800) Resp:  [21-34] 23 (11/16 0800) BP: (109-150)/(60-100) 113/81 (11/16 0800) SpO2:  [91 %-100 %] 95 % (11/16 0800) Weight:  [65.9 kg] 65.9 kg (11/16 0600)  Hemodynamic parameters for last 24 hours:    Intake/Output from previous day: 11/15 0701 - 11/16 0700 In: 240 [P.O.:240] Out: -  Intake/Output this shift: Total I/O In: 240 [P.O.:240] Out: -   General appearance: alert and cooperative Neurologic: intact Heart: regular rate and rhythm, S1, S2 normal, no murmur Lungs: clear to auscultation bilaterally Extremities: no edema Wound: incisions ok  Lab Results: Recent Labs    11/14/20 0409 11/16/20 0127  WBC 12.4* 12.2*  HGB 9.3* 10.9*  HCT 27.0* 32.2*  PLT 134* 256   BMET:  Recent Labs    11/14/20 0409 11/15/20 0045  NA 135 135  K 3.7 4.1  CL 108 103  CO2 21* 24  GLUCOSE 101* 131*  BUN 10 11  CREATININE 0.52 0.71  CALCIUM 7.6* 8.6*    PT/INR: No results for input(s): LABPROT, INR in the last 72 hours. ABG    Component Value Date/Time   PHART 7.323 (L) 11/11/2020 2039   HCO3 19.1 (L) 11/11/2020 2039   TCO2 20 (L) 11/11/2020 2039   ACIDBASEDEF 6.0 (H) 11/11/2020 2039   O2SAT 98.0 11/11/2020 2039   CBG (last 3)  Recent Labs    11/15/20 1723 11/15/20 2116 11/16/20 0651  GLUCAP 105* 136* 122*    Assessment/Plan: S/P  Procedure(s) (LRB): CORONARY ARTERY BYPASS GRAFTING (CABG) X  2 ON PUMP USING RIGHT ENDOSCOPIC GREATER SAPHENOUS VEIN CONDUITS (N/A) AORTIC VALVE REPLACEMENT (AVR) WITH INSPIRIS RESILIA AORTIC VALVE SIZE 21MM (N/A) TRANSESOPHAGEAL ECHOCARDIOGRAM (TEE) (N/A) APPLICATION OF CELL SAVER ENDOVEIN HARVEST OF GREATER SAPHENOUS VEIN (Right)  POD 5 She is doing well. Maintaining sinus rhythm postop. Ambulating well and bowels working. She is ready to go home today. Will discharge with her daughter.   LOS: 5 days    Gaye Pollack 11/16/2020

## 2020-11-17 ENCOUNTER — Telehealth (HOSPITAL_COMMUNITY): Payer: Self-pay

## 2020-11-17 MED FILL — Electrolyte-R (PH 7.4) Solution: INTRAVENOUS | Qty: 5000 | Status: AC

## 2020-11-17 MED FILL — Mannitol IV Soln 20%: INTRAVENOUS | Qty: 500 | Status: AC

## 2020-11-17 MED FILL — Albumin, Human Inj 5%: INTRAVENOUS | Qty: 250 | Status: AC

## 2020-11-17 MED FILL — Sodium Bicarbonate IV Soln 8.4%: INTRAVENOUS | Qty: 50 | Status: AC

## 2020-11-17 MED FILL — Sodium Chloride IV Soln 0.9%: INTRAVENOUS | Qty: 2000 | Status: AC

## 2020-11-17 MED FILL — Calcium Chloride Inj 10%: INTRAVENOUS | Qty: 10 | Status: AC

## 2020-11-17 NOTE — Telephone Encounter (Signed)
Per phase I cardiac rehab, fax cardiac rehab referral to [facility] cardiac rehab.

## 2020-11-21 ENCOUNTER — Other Ambulatory Visit: Payer: Self-pay

## 2020-11-21 ENCOUNTER — Ambulatory Visit (INDEPENDENT_AMBULATORY_CARE_PROVIDER_SITE_OTHER): Payer: Self-pay

## 2020-11-21 DIAGNOSIS — Z4802 Encounter for removal of sutures: Secondary | ICD-10-CM

## 2020-11-21 LAB — ECHO INTRAOPERATIVE TEE
AR max vel: 0.7 cm2
AV Area VTI: 0.68 cm2
AV Area mean vel: 0.69 cm2
AV Mean grad: 33 mmHg
AV Peak grad: 59.9 mmHg
Ao pk vel: 3.87 m/s
Height: 59 in
MV M vel: 5.21 m/s
MV Peak grad: 108.6 mmHg
MV VTI: 1.35 cm2
Radius: 0.4 cm
Weight: 2384 oz

## 2020-11-21 MED ORDER — POTASSIUM CHLORIDE CRYS ER 20 MEQ PO TBCR: 20.0000 meq | EXTENDED_RELEASE_TABLET | Freq: Every day | ORAL | 0 refills | Status: DC

## 2020-11-21 MED ORDER — FUROSEMIDE 40 MG PO TABS: 40.0000 mg | ORAL_TABLET | Freq: Every day | ORAL | 0 refills | Status: DC

## 2020-11-21 NOTE — Progress Notes (Signed)
Patient arrived for nurse visit to remove 2 sutures post- procedure CABG/ AVR with Dr. Cyndia Bent 11/11/20.  Sutures removed with no signs/ symptoms of infection noted.  Patient tolerated procedure well.  Patient/ family instructed to keep the incision sites clean and dry.  Patient/ family acknowledged instructions given.    Patient stated that she has recently within the last couple days noticed more swelling bilaterally in her lower legs. She stated that her weight has started to rise as well from 145-148 pounds. She has been elevating her legs when seated. She is not currently on any diuretics at discharge from the hospital. Upon observation, legs do have 2+ pitting edema that travels mid-calf. Advised I would let Dr. Cyndia Bent or a PA here in the office aware and advise. She acknowledged receipt.

## 2020-11-21 NOTE — Progress Notes (Signed)
Spoke with Lars Pinks, PA about patient's complaint of lower extremity edema bilateral legs. She stated that patient should take Lasix 40mg  daily PO x 7days and Potassium 41meq daily PO x 7 days. Sent in new prescription to patient's preferred pharmacy of [facility] in [place]. Patient called and made aware of new prescription. She acknowledged receipt.

## 2020-11-22 DIAGNOSIS — D62 Acute posthemorrhagic anemia: Secondary | ICD-10-CM | POA: Diagnosis not present

## 2020-11-22 DIAGNOSIS — Z79899 Other long term (current) drug therapy: Secondary | ICD-10-CM | POA: Diagnosis not present

## 2020-11-22 DIAGNOSIS — Z952 Presence of prosthetic heart valve: Secondary | ICD-10-CM | POA: Diagnosis not present

## 2020-11-22 DIAGNOSIS — Z7689 Persons encountering health services in other specified circumstances: Secondary | ICD-10-CM | POA: Diagnosis not present

## 2020-12-07 ENCOUNTER — Ambulatory Visit: Payer: Medicare Other | Admitting: Nurse Practitioner

## 2020-12-07 ENCOUNTER — Encounter: Payer: Self-pay | Admitting: Nurse Practitioner

## 2020-12-07 ENCOUNTER — Other Ambulatory Visit: Payer: Self-pay

## 2020-12-07 VITALS — BP 120/70 | HR 72 | Ht 59.0 in | Wt 147.1 lb

## 2020-12-07 DIAGNOSIS — E7800 Pure hypercholesterolemia, unspecified: Secondary | ICD-10-CM | POA: Diagnosis not present

## 2020-12-07 DIAGNOSIS — I350 Nonrheumatic aortic (valve) stenosis: Secondary | ICD-10-CM

## 2020-12-07 DIAGNOSIS — I10 Essential (primary) hypertension: Secondary | ICD-10-CM | POA: Diagnosis not present

## 2020-12-07 DIAGNOSIS — Z952 Presence of prosthetic heart valve: Secondary | ICD-10-CM

## 2020-12-07 DIAGNOSIS — D649 Anemia, unspecified: Secondary | ICD-10-CM | POA: Diagnosis not present

## 2020-12-07 DIAGNOSIS — I2510 Atherosclerotic heart disease of native coronary artery without angina pectoris: Secondary | ICD-10-CM | POA: Diagnosis not present

## 2020-12-07 MED ORDER — METOPROLOL TARTRATE 25 MG PO TABS: 12.5000 mg | ORAL_TABLET | Freq: Two times a day (BID) | ORAL | 3 refills | Status: DC

## 2020-12-07 MED ORDER — EZETIMIBE 10 MG PO TABS: 10.0000 mg | ORAL_TABLET | Freq: Every day | ORAL | 3 refills | Status: DC

## 2020-12-07 NOTE — Patient Instructions (Addendum)
Medication Instructions:  - Your physician has recommended you make the following change in your medication:   1) START zetia (ezetimibe) 10 mg: - take 1 tablet by mouth ONCE daily   *If you need a refill on your cardiac medications before your next appointment, please call your pharmacy*   Lab Work: - Your physician recommends that you have lab work today: CBC   If you have labs (blood work) drawn today and your tests are completely normal, you will receive your results only by: [place] (if you have [place]) OR A paper copy in the mail If you have any lab test that is abnormal or we need to change your treatment, we will call you to review the results.   Testing/Procedures: - none ordered   Follow-Up: At [facility], you and your health needs are our priority.  As part of our continuing mission to provide you with exceptional heart care, we have created designated Provider Care Teams.  These Care Teams include your primary Cardiologist (physician) and Advanced Practice Providers (APPs -  Physician Assistants and Nurse Practitioners) who all work together to provide you with the care you need, when you need it.  We recommend signing up for the patient portal called "MyChart".  Sign up information is provided on this After Visit Summary.  MyChart is used to connect with patients for Virtual Visits (Telemedicine).  Patients are able to view lab/test results, encounter notes, upcoming appointments, etc.  Non-urgent messages can be sent to your provider as well.   To learn more about what you can do with MyChart, go to [email or web address].    Your next appointment:   6-8 week(s)  The format for your next appointment:   In Person  Provider:   You may see Kate Sable, MD or one of the following Advanced Practice Providers on your designated Care Team:    Murray Hodgkins, NP    Other Instructions  ZETIA (Ezetimibe) Tablets What is this  medication? EZETIMIBE (ez ET i mibe) treats high cholesterol. It works by reducing the amount of cholesterol absorbed from the food you eat. This decreases the amount of bad cholesterol (such as LDL) in your blood. Changes to diet and exercise are often combined with this medication. This medicine may be used for other purposes; ask your health care provider or pharmacist if you have questions. COMMON BRAND NAME(S): Zetia What should I tell my care team before I take this medication? They need to know if you have any of these conditions: Kidney disease Liver disease Muscle cramps, pain Muscle injury Thyroid disease An unusual or allergic reaction to ezetimibe, other medications, foods, dyes, or preservatives Pregnant or trying to get pregnant Breast-feeding How should I use this medication? Take this medication by mouth. Take it as directed on the prescription label at the same time every day. You can take it with or without food. If it upsets your stomach, take it with food. Keep taking it unless your care team tells you to stop. Take bile acid sequestrants at a different time of day than this medication. Take this medication 2 hours BEFORE or 4 hours AFTER bile acid sequestrants. Talk to your care team about the use of this medication in children. While it may be prescribed for children as young as 10 for selected conditions, precautions do apply. Overdosage: If you think you have taken too much of this medicine contact a poison control center or emergency room at once. NOTE: This medicine is only  for you. Do not share this medicine with others. What if I miss a dose? If you miss a dose, take it as soon as you can. If it is almost time for your next dose, take only that dose. Do not take double or extra doses. What may interact with this medication? Do not take this medication with any of the following: Fenofibrate Gemfibrozil This medication may also interact with the  following: Antacids Cyclosporine Herbal medications like red yeast rice Other medications to lower cholesterol or triglycerides This list may not describe all possible interactions. Give your health care provider a list of all the medicines, herbs, non-prescription drugs, or dietary supplements you use. Also tell them if you smoke, drink alcohol, or use illegal drugs. Some items may interact with your medicine. What should I watch for while using this medication? Visit your care team for regular checks on your progress. Tell your care team if your symptoms do not start to get better or if they get worse. Your care team may tell you to stop taking this medication if you develop muscle problems. If your muscle problems do not go away after stopping this medication, contact your care team. Do not become pregnant while taking this medication. Women should inform their care team if they wish to become pregnant or think they might be pregnant. There is potential for serious harm to an unborn child. Talk to your care team for more information. Do not breast-feed an infant while taking this medication. Taking this medication is only part of a total heart healthy program. Your care team may give you a special diet to follow. Avoid alcohol. Avoid smoking. Ask your care team how much you should exercise. What side effects may I notice from receiving this medication? Side effects that you should report to your doctor or health care provider as soon as possible: Allergic reactions--skin rash, itching or hives, swelling of the face, lips, tongue, or throat Side effects that usually do not require medical attention (report to your doctor or health care provider if they continue or are bothersome): Diarrhea Joint pain This list may not describe all possible side effects. Call your doctor for medical advice about side effects. You may report side effects to FDA at 1-800-FDA-1088. Where should I keep my  medication? Keep out of the reach of children and pets. Store at room temperature between 15 and 30 degrees C (59 and 86 degrees F). Protect from moisture. Get rid of any unused medication after the expiration date. NOTE: This sheet is a summary. It may not cover all possible information. If you have questions about this medicine, talk to your doctor, pharmacist, or health care provider.  2022 [employer]vier/Gold Standard (2020-01-15 00:00:00)

## 2020-12-07 NOTE — Progress Notes (Signed)
Office Visit    Patient Name: Shelley Berg Date of Encounter: 12/07/2020  Primary Care Provider:  Leonides Sake, MD Primary Cardiologist:  Kate Sable, MD  Chief Complaint    75 year old female with a history of aortic stenosis, coronary artery disease, hypertension, hyperlipidemia, obesity, prediabetes, rheumatoid arthritis, and GERD, who presents for follow-up after recent SAVR and CABG x2.  Past Medical History    Past Medical History:  Diagnosis Date   Aortic stenosis    a. 05/2020 Echo: EF 70-75%, GrI DD, mod-sev AS; b. 08/2020 Cath: AS peak grad 3mmHg w/ AvA 1.00 cm^2; c. 11/2020 SAVR: s/p 55mm Edwards INSPIRIS RESILIA pericardial valve.   Asthma    as a child, no problems as an adult, no inhalers   CAD (coronary artery disease)    a. 08/2020 Cath: LM nl, LAD 60p, 90m, LCX 30ost/p, 47m, RCA 158m w/ L->R collats filling RPDA from LAD; b. 11/2020 CABG x 2 (w/ AVR): VG->OM, VG->RPDA.   Dyspnea    with exertion   GERD (gastroesophageal reflux disease)    Heart murmur    Hyperlipidemia    Hypertension    Obesity    Pre-diabetes    Rheumatoid arthritis ([place])    Seasonal allergies    Past Surgical History:  Procedure Laterality Date   AORTIC VALVE REPLACEMENT N/A 11/11/2020   Procedure: AORTIC VALVE REPLACEMENT (AVR) WITH INSPIRIS RESILIA AORTIC VALVE SIZE 21MM;  Surgeon: Gaye Pollack, MD;  Location: [facility];  Service: Open Heart Surgery;  Laterality: N/A;   CORONARY ARTERY BYPASS GRAFT N/A 11/11/2020   Procedure: CORONARY ARTERY BYPASS GRAFTING (CABG) X  2 ON PUMP USING RIGHT ENDOSCOPIC GREATER SAPHENOUS VEIN CONDUITS;  Surgeon: Gaye Pollack, MD;  Location: [facility];  Service: Open Heart Surgery;  Laterality: N/A;   ENDOVEIN HARVEST OF GREATER SAPHENOUS VEIN Right 11/11/2020   Procedure: ENDOVEIN HARVEST OF GREATER SAPHENOUS VEIN;  Surgeon: Gaye Pollack, MD;  Location: [facility];  Service: Open Heart Surgery;  Laterality: Right;   RIGHT/LEFT HEART CATH AND  CORONARY ANGIOGRAPHY N/A 08/10/2020   Procedure: RIGHT/LEFT HEART CATH AND CORONARY ANGIOGRAPHY;  Surgeon: Wellington Hampshire, MD;  Location: [facility];  Service: Cardiovascular;  Laterality: N/A;   TEE WITHOUT CARDIOVERSION N/A 11/11/2020   Procedure: TRANSESOPHAGEAL ECHOCARDIOGRAM (TEE);  Surgeon: Gaye Pollack, MD;  Location: [facility];  Service: Open Heart Surgery;  Laterality: N/A;    Allergies  Allergies  Allergen Reactions   Penicillins Rash    History of Present Illness    75 year old female with the above complex past medical history including aortic stenosis, moderate coronary artery disease, hypertension, hyperlipidemia, obesity, prediabetes, rheumatoid arthritis, and GERD.  She was evaluated in early 2022 in the setting of chest pain and cardiac murmur.  An echocardiogram showed an EF of 70 to 75% with moderate aortic stenosis and impaired relaxation.  She subsequently underwent stress testing which showed inferolateral ischemia and three-vessel coronary calcification.  In August 2022, she underwent diagnostic catheterization which revealed severe mid circumflex disease and total occlusion of the right coronary artery with left-to-right collaterals from the LAD feeding the RPDA.  Aortic stenosis was felt to be moderate to severe with a peak gradient of 42 mmHg and an aortic valve area of 1.0 cm.  She was referred to the structural heart clinic in [place] and underwent CTA of the chest which showed a probable bicuspid aortic valve with severely reduced cusp separation and severely thickened and  calcified valve cusps.  Severe three-vessel coronary calcifications were also noted.  She was referred to cardiothoracic surgery and decision was made to pursue SAVR with CABG x2.  She was admitted to [facility] on November 10 and underwent successful SAVR and CABG x2 with a vein graft to the OM and vein graft to the RPDA.  Postoperatively, she was anemic and required packed red blood cells.   She did not have any significant arrhythmias, and was discharged home on November 16.  She is scheduled for follow-up echo December 22.  Since discharge, she notes that she has done very well.  She did require a brief course of Lasix due to right lower extremity swelling, which has resolved and Lasix has been discontinued.  She notes complete resolution of dyspnea on exertion and angina that was present prior to surgery.  Surgical scars have been healing well.  Her right medial lower thigh site has been swollen and bruised but overall improving.  She denies palpitations, PND, orthopnea, dizziness, syncope, or early satiety.  Home Medications    Current Outpatient Medications  Medication Sig Dispense Refill   acetaminophen (TYLENOL) 500 MG tablet Take 1,000 mg by mouth every 6 (six) hours as needed for moderate pain.     aspirin EC 325 MG EC tablet Take 1 tablet (325 mg total) by mouth daily.     atorvastatin (LIPITOR) 80 MG tablet Take 1 tablet (80 mg total) by mouth daily. 90 tablet 1   Calcium Carb-Cholecalciferol (CALCIUM 600+D3 PO) Take 1 tablet by mouth in the morning.     Cholecalciferol (VITAMIN D3) 125 MCG (5000 UT) TABS Take 5,000 Units by mouth.     ferrous sulfate 325 (65 FE) MG tablet Take 1 tablet (325 mg total) by mouth 2 (two) times daily with a meal. 60 tablet 3   folic acid (FOLVITE) 1 MG tablet Take 1 mg by mouth in the morning.     hydroxychloroquine (PLAQUENIL) 200 MG tablet Take 400 mg by mouth in the morning.     loratadine (CLARITIN) 10 MG tablet Take 10 mg by mouth in the morning.     metoprolol tartrate (LOPRESSOR) 25 MG tablet Take 0.5 tablets (12.5 mg total) by mouth 2 (two) times daily. 30 tablet 1   Omega-3 1000 MG CAPS Take 1,000 mg by mouth in the morning.     omeprazole (PRILOSEC) 20 MG capsule Take 20 mg by mouth in the morning.     No current facility-administered medications for this visit.     Review of Systems    Doing well since recent  surgery-CABG/SAVR.  Right greater than left lower extremity swelling which has resolved.  She denies chest pain, palpitations, dyspnea, PND, orthopnea, dizziness, syncope, or early satiety.  All other systems reviewed and are otherwise negative except as noted above.    Physical Exam    VS:  BP 120/70 (BP Location: Left Arm, Patient Position: Sitting, Cuff Size: Normal)   Pulse 72   Ht 4\' 11"  (1.499 m)   Wt 147 lb 2 oz (66.7 kg)   SpO2 99%   BMI 29.72 kg/m  , BMI Body mass index is 29.72 kg/m.     GEN: Well nourished, well developed, in no acute distress. HEENT: normal. Neck: Supple, no JVD, carotid bruits, or masses. Cardiac: RRR, 1/6 systolic murmur at the upper sternal borders, no rubs or gallops. No clubbing, cyanosis.  Trace right ankle edema.  Radials/PT 2+ and equal bilaterally.  Sternal incision is  healing well without bleeding, drainage, or erythema.  Right medial lower thigh incision is healing well without bleeding, drainage, or erythema.  The area is mildly swollen but nontender.   Respiratory:  Respirations regular and unlabored, clear to auscultation bilaterally. GI: Soft, nontender, nondistended, BS + x 4. MS: no deformity or atrophy. Skin: warm and dry, no rash. Neuro:  Strength and sensation are intact. Psych: Normal affect.  Accessory Clinical Findings    ECG personally reviewed by me today -regular sinus rhythm, 72, poor R wave progression- ?  Prior anterior infarct- no acute changes.  Lab Results  Component Value Date   WBC 12.2 (H) 11/16/2020   HGB 10.9 (L) 11/16/2020   HCT 32.2 (L) 11/16/2020   MCV 91.0 11/16/2020   PLT 256 11/16/2020   Lab Results  Component Value Date   CREATININE 0.71 11/15/2020   BUN 11 11/15/2020   NA 135 11/15/2020   K 4.1 11/15/2020   CL 103 11/15/2020   CO2 24 11/15/2020   Lab Results  Component Value Date   ALT 22 11/09/2020   AST 27 11/09/2020   ALKPHOS 68 11/09/2020   BILITOT 0.8 11/09/2020   Lab Results   Component Value Date   CHOL 163 08/16/2020   HDL 60 08/16/2020   LDLCALC 77 08/16/2020   TRIG 128 08/16/2020   CHOLHDL 2.7 08/16/2020    Lab Results  Component Value Date   HGBA1C 6.2 (H) 11/09/2020    Assessment & Plan    1.  Moderate to severe aortic stenosis s/p SAVR: Initially diagnosed by echocardiogram in May 2022.  In the setting of severe multivessel coronary disease, she recently underwent CABG x2 and surgical aortic valve replacement.  She has felt well since surgery and has had no recurrence of exertional dyspnea or angina.  Surgical incision sites are healing well.  She does plan to enroll in cardiac rehabilitation once cleared by surgery.  She is follow-up with CT surgery next week and then follow-up echocardiogram in December 22.  2.  Coronary artery disease: Status post CABG x2.  As outlined above, she has not been having any angina and surgical sites are healing well.  She remains on aspirin, statin, and beta-blocker therapy.  We discussed her LDL of 77 despite Lipitor 80 mg.  Given high risk for future CV events, LDL goal should be less than 55.  I am adding ezetimibe today with plan to follow-up fasting lipids and LFTs in about 6 to 8 weeks (at time of follow-up).  If unable to achieve goal with ezetimibe, will look to add PCSK9 inhibitor.  3.  Essential hypertension: Stable on low-dose beta-blocker therapy.  Refilling today.  4.  Hyperlipidemia: See #2.  Adding ezetimibe with goal LDL of less than 55.  Follow-up lipids and LFTs in approximately 6 to 8 weeks and consider PCSK9 inhibitor if necessary.  5.  Postoperative anemia: Status post transfusion during hospitalization.  Follow-up CBC today.  6.  Disposition: Follow-up CBC today.  Plan for echocardiogram scheduled for December 22.  Follow-up in office in 6 to 8 weeks with fasting lipids and LFTs at that time.   Murray Hodgkins, NP 12/07/2020, 9:43 AM

## 2020-12-08 LAB — CBC WITH DIFFERENTIAL/PLATELET
Basophils Absolute: 0.1 10*3/uL (ref 0.0–0.2)
Basos: 1 %
EOS (ABSOLUTE): 0.6 10*3/uL — ABNORMAL HIGH (ref 0.0–0.4)
Eos: 7 %
Hematocrit: 36.3 % (ref 34.0–46.6)
Hemoglobin: 12.4 g/dL (ref 11.1–15.9)
Immature Grans (Abs): 0.1 10*3/uL (ref 0.0–0.1)
Immature Granulocytes: 1 %
Lymphocytes Absolute: 2.3 10*3/uL (ref 0.7–3.1)
Lymphs: 27 %
MCH: 30.4 pg (ref 26.6–33.0)
MCHC: 34.2 g/dL (ref 31.5–35.7)
MCV: 89 fL (ref 79–97)
Monocytes Absolute: 0.8 10*3/uL (ref 0.1–0.9)
Monocytes: 10 %
Neutrophils Absolute: 4.9 10*3/uL (ref 1.4–7.0)
Neutrophils: 54 %
Platelets: 390 10*3/uL (ref 150–450)
RBC: 4.08 x10E6/uL (ref 3.77–5.28)
RDW: 12.4 % (ref 11.7–15.4)
WBC: 8.8 10*3/uL (ref 3.4–10.8)

## 2020-12-12 DIAGNOSIS — D72829 Elevated white blood cell count, unspecified: Secondary | ICD-10-CM | POA: Diagnosis not present

## 2020-12-13 ENCOUNTER — Other Ambulatory Visit: Payer: Self-pay | Admitting: Surgery

## 2020-12-13 DIAGNOSIS — Z951 Presence of aortocoronary bypass graft: Secondary | ICD-10-CM

## 2020-12-14 ENCOUNTER — Ambulatory Visit (INDEPENDENT_AMBULATORY_CARE_PROVIDER_SITE_OTHER): Payer: Self-pay | Admitting: Surgery

## 2020-12-14 ENCOUNTER — Encounter: Payer: Self-pay | Admitting: Surgery

## 2020-12-14 ENCOUNTER — Other Ambulatory Visit: Payer: Self-pay

## 2020-12-14 ENCOUNTER — Ambulatory Visit
Admission: RE | Admit: 2020-12-14 | Discharge: 2020-12-14 | Disposition: A | Discharge status: Home / Self Care | Payer: Medicare Other | Source: Ambulatory Visit | Attending: Surgery | Admitting: Surgery

## 2020-12-14 VITALS — BP 160/90 | HR 82 | Resp 20 | Ht 59.0 in | Wt 148.0 lb

## 2020-12-14 DIAGNOSIS — I2510 Atherosclerotic heart disease of native coronary artery without angina pectoris: Secondary | ICD-10-CM

## 2020-12-14 DIAGNOSIS — I350 Nonrheumatic aortic (valve) stenosis: Secondary | ICD-10-CM

## 2020-12-14 DIAGNOSIS — Z951 Presence of aortocoronary bypass graft: Secondary | ICD-10-CM

## 2020-12-14 DIAGNOSIS — Z952 Presence of prosthetic heart valve: Secondary | ICD-10-CM | POA: Diagnosis not present

## 2020-12-14 NOTE — Progress Notes (Signed)
HPI: Patient returns for routine postoperative follow-up having undergone CABG x 2 and AVR using a 21 mm INSPIRIS pericardial valve on 11/11/20. The patient's early postoperative recovery while in the hospital was notable for an uncomplicated postop course. Since hospital discharge the patient reports that she has been doing well. She is walking daily without chest pain or shortness of breath.   Current Outpatient Medications  Medication Sig Dispense Refill   acetaminophen (TYLENOL) 500 MG tablet Take 1,000 mg by mouth every 6 (six) hours as needed for moderate pain.     aspirin EC 325 MG EC tablet Take 1 tablet (325 mg total) by mouth daily.     atorvastatin (LIPITOR) 80 MG tablet Take 1 tablet (80 mg total) by mouth daily. 90 tablet 1   Calcium Carb-Cholecalciferol (CALCIUM 600+D3 PO) Take 1 tablet by mouth in the morning.     Cholecalciferol (VITAMIN D3) 125 MCG (5000 UT) TABS Take 5,000 Units by mouth.     ezetimibe (ZETIA) 10 MG tablet Take 1 tablet (10 mg total) by mouth daily. 90 tablet 3   ferrous sulfate 325 (65 FE) MG tablet Take 1 tablet (325 mg total) by mouth 2 (two) times daily with a meal. 60 tablet 3   folic acid (FOLVITE) 1 MG tablet Take 1 mg by mouth in the morning.     hydroxychloroquine (PLAQUENIL) 200 MG tablet Take 400 mg by mouth in the morning.     loratadine (CLARITIN) 10 MG tablet Take 10 mg by mouth in the morning.     metoprolol tartrate (LOPRESSOR) 25 MG tablet Take 0.5 tablets (12.5 mg total) by mouth 2 (two) times daily. 90 tablet 3   Omega-3 1000 MG CAPS Take 1,000 mg by mouth in the morning.     omeprazole (PRILOSEC) 20 MG capsule Take 20 mg by mouth in the morning.     No current facility-administered medications for this visit.    Physical Exam: BP (!) 160/90    Pulse 82    Resp 20    Ht 4\' 11"  (1.499 m)    Wt 67.1 kg    SpO2 98% Comment: RA   BMI 29.89 kg/m  She looks well Cardiac exam shows a regular rate and rhythm with normal heart sounds.  There is no murmur. Lungs are clear. The incision is healing well and the sternum is stable. The leg incision is healing well with a small seroma beneath incision.  Diagnostic Tests:  Narrative & Impression  CLINICAL DATA:  Previous coronary bypass surgery   EXAM: CHEST - 2 VIEW   COMPARISON:  Previous studies including the examination of 11/16/2020   FINDINGS: Transverse diameter of heart is within normal limits. Thoracic aorta is tortuous. There are no signs of pulmonary edema or focal pulmonary consolidation. There is interval resolution of infiltrate in the medial left lower lung fields. There are air-filled structures of varying sizes in the medial left upper lung fields with thick wall. There is evidence of previous coronary bypass surgery and prosthetic cardiac valve placement.   IMPRESSION: There is previous coronary bypass surgery and placement of prosthetic intracardiac valve. There are no signs of pulmonary edema or focal pulmonary consolidation. There are thick-walled air-filled structures along the medial aspect of left upper lung fields. Please correlate for possible bronchiectasis or some other infectious process in the lung fields. If clinically warranted, follow-up CT may be considered.     Electronically Signed   By: Prudy Feeler.D.  On: 12/14/2020 08:53      Impression:  Overall she is progressing well after her surgery. I told her that she can return to driving now. She should refrain from lifting more than 10 lbs for three months postop. She is planning to participate in cardiac rehab in [facility] and will contact them to see when she can start. I think she is ready to begin that. I told her that she should not return to her job at [employer] full time for three months postop.  Plan:  She will continue to follow up with cardiology and is scheduled for a baseline postop echo on 12/22/2020.    Gaye Pollack, MD [facility] [phone]

## 2020-12-21 ENCOUNTER — Encounter: Payer: Self-pay | Admitting: *Deleted

## 2020-12-22 ENCOUNTER — Other Ambulatory Visit: Payer: Self-pay

## 2020-12-22 ENCOUNTER — Ambulatory Visit (INDEPENDENT_AMBULATORY_CARE_PROVIDER_SITE_OTHER): Payer: Medicare Other

## 2020-12-22 DIAGNOSIS — Z952 Presence of prosthetic heart valve: Secondary | ICD-10-CM | POA: Diagnosis not present

## 2020-12-22 LAB — ECHOCARDIOGRAM COMPLETE
AR max vel: 2.18 cm2
AV Area VTI: 2.21 cm2
AV Area mean vel: 2.31 cm2
AV Mean grad: 5 mmHg
AV Peak grad: 10 mmHg
Ao pk vel: 1.59 m/s
Area-P 1/2: 3.65 cm2
Calc EF: 51.5 %
S' Lateral: 2.6 cm
Single Plane A2C EF: 52.7 %
Single Plane A4C EF: 50.7 %

## 2020-12-31 DIAGNOSIS — Z20822 Contact with and (suspected) exposure to covid-19: Secondary | ICD-10-CM | POA: Diagnosis not present

## 2020-12-31 DIAGNOSIS — J069 Acute upper respiratory infection, unspecified: Secondary | ICD-10-CM | POA: Diagnosis not present

## 2021-01-06 ENCOUNTER — Telehealth: Payer: Self-pay | Admitting: Cardiology

## 2021-01-06 NOTE — Telephone Encounter (Signed)
Spoke with patient and reviewed the office visit note from her last visit with Dr. Cyndia Bent with her. Patient stated that she did reach out to the [facility] cardiac rehab and they had no orders for her. I informed her that I would send our correspondence to Dr. Cyndia Bent so his office could coordinate this for her. Patient was very appreciative for the call back.  Patient was seen by Dr. Cyndia Bent from [facility] on 12/14/20.  From OV note on 12/14/20:   Overall she is progressing well after her surgery. I told her that she can return to driving now. She should refrain from lifting more than 10 lbs for three months postop. She is planning to participate in cardiac rehab in [facility] and will contact them to see when she can start. I think she is ready to begin that. I told her that she should not return to her job at [employer] full time for three months postop.

## 2021-01-06 NOTE — Telephone Encounter (Signed)
Patient states when she was discharged from the hospital in November, she remembers "someone telling me I needed cardiac rehab, and someone will call me". Patient states she has not heard anything regarding this.please call to discuss.

## 2021-01-12 DIAGNOSIS — Z85828 Personal history of other malignant neoplasm of skin: Secondary | ICD-10-CM | POA: Diagnosis not present

## 2021-01-12 DIAGNOSIS — L404 Guttate psoriasis: Secondary | ICD-10-CM | POA: Diagnosis not present

## 2021-01-12 DIAGNOSIS — L570 Actinic keratosis: Secondary | ICD-10-CM | POA: Diagnosis not present

## 2021-01-12 DIAGNOSIS — L821 Other seborrheic keratosis: Secondary | ICD-10-CM | POA: Diagnosis not present

## 2021-01-12 DIAGNOSIS — L814 Other melanin hyperpigmentation: Secondary | ICD-10-CM | POA: Diagnosis not present

## 2021-01-12 DIAGNOSIS — L905 Scar conditions and fibrosis of skin: Secondary | ICD-10-CM | POA: Diagnosis not present

## 2021-02-03 ENCOUNTER — Encounter: Payer: Self-pay | Admitting: Cardiology

## 2021-02-03 ENCOUNTER — Ambulatory Visit: Payer: Medicare Other | Admitting: Cardiology

## 2021-02-03 ENCOUNTER — Other Ambulatory Visit: Payer: Self-pay

## 2021-02-03 VITALS — BP 120/72 | HR 69 | Ht 59.0 in | Wt 149.0 lb

## 2021-02-03 DIAGNOSIS — E7800 Pure hypercholesterolemia, unspecified: Secondary | ICD-10-CM | POA: Diagnosis not present

## 2021-02-03 DIAGNOSIS — Z951 Presence of aortocoronary bypass graft: Secondary | ICD-10-CM

## 2021-02-03 DIAGNOSIS — I10 Essential (primary) hypertension: Secondary | ICD-10-CM

## 2021-02-03 DIAGNOSIS — Z952 Presence of prosthetic heart valve: Secondary | ICD-10-CM | POA: Diagnosis not present

## 2021-02-03 MED ORDER — ASPIRIN 81 MG PO TBEC: 81.0000 mg | DELAYED_RELEASE_TABLET | Freq: Every day | ORAL | Status: AC

## 2021-02-03 MED ORDER — ATORVASTATIN CALCIUM 80 MG PO TABS: 80.0000 mg | ORAL_TABLET | Freq: Every day | ORAL | 1 refills | Status: DC

## 2021-02-03 NOTE — Progress Notes (Signed)
Cardiology Office Note:    Date:  02/03/2021   ID:  SHARNE LINDERS, DOB Apr 10, 1945, MRN [id number]  PCP:  Leonides Sake, MD   [facility] Providers Cardiologist:  Kate Sable, MD     Referring MD: Leonides Sake, MD   Chief Complaint  Patient presents with   Other    Follow up post ECHO -- Meds reviewed verbally with patient.     History of Present Illness:    Shelley Berg is a 76 y.o. female with a hx of CAD s/p CABG x 2 11/2020 (SVG-OM, SVG -PDA), severe AS s/p AVR bioprosthetic 11/2020, hypertension, hyperlipidemia presents for follow-up.    Previously being followed for severe aortic stenosis.  Underwent left heart cath showing 100% RCA stenosis, 85% mid left circumflex.  LAD had 60% proximal disease.  Patient underwent CABG with vein graft bypasses to obtuse marginal and right PDA.  Aortic valve replacement with bioprosthetic valve was also performed.  Postop course has been unremarkable, healing nicely.  Repeat echo performed 12/2020 showed normal functioning aortic valve prosthesis, EF 50-55%.  She has some soreness of her breastbone around surgical site.  Overall soreness is improving.  He also has leg soreness along vein extraction site, which is also improving.  Still waiting to get into rehab.   Prior notes LHC 08/2020 proximal LAD 60%, mid LAD 40%, mid RCA 100%, mid left circumflex 85%. Echo 05/2020 EF 70 to 75%, moderate aortic stenosis, impaired relaxation  family history of MI in her dad age 77s, 2 brothers had bypass surgeries around age 17s.   Past Medical History:  Diagnosis Date   Aortic stenosis    a. 05/2020 Echo: EF 70-75%, GrI DD, mod-sev AS; b. 08/2020 Cath: AS peak grad 56mmHg w/ AvA 1.00 cm^2; c. 11/2020 SAVR: s/p 60mm Edwards INSPIRIS RESILIA pericardial valve.   Asthma    as a child, no problems as an adult, no inhalers   CAD (coronary artery disease)    a. 08/2020 Cath: LM nl, LAD 60p, 68m, LCX 30ost/p, 54m, RCA 120m w/ L->R collats  filling RPDA from LAD; b. 11/2020 CABG x 2 (w/ AVR): VG->OM, VG->RPDA.   Dyspnea    with exertion   GERD (gastroesophageal reflux disease)    Heart murmur    Hyperlipidemia    Hypertension    Obesity    Pre-diabetes    Rheumatoid arthritis ([place])    Seasonal allergies     Past Surgical History:  Procedure Laterality Date   AORTIC VALVE REPLACEMENT N/A 11/11/2020   Procedure: AORTIC VALVE REPLACEMENT (AVR) WITH INSPIRIS RESILIA AORTIC VALVE SIZE 21MM;  Surgeon: Gaye Pollack, MD;  Location: [facility];  Service: Open Heart Surgery;  Laterality: N/A;   CORONARY ARTERY BYPASS GRAFT N/A 11/11/2020   Procedure: CORONARY ARTERY BYPASS GRAFTING (CABG) X  2 ON PUMP USING RIGHT ENDOSCOPIC GREATER SAPHENOUS VEIN CONDUITS;  Surgeon: Gaye Pollack, MD;  Location: [place];  Service: Open Heart Surgery;  Laterality: N/A;   ENDOVEIN HARVEST OF GREATER SAPHENOUS VEIN Right 11/11/2020   Procedure: ENDOVEIN HARVEST OF GREATER SAPHENOUS VEIN;  Surgeon: Gaye Pollack, MD;  Location: [place];  Service: Open Heart Surgery;  Laterality: Right;   RIGHT/LEFT HEART CATH AND CORONARY ANGIOGRAPHY N/A 08/10/2020   Procedure: RIGHT/LEFT HEART CATH AND CORONARY ANGIOGRAPHY;  Surgeon: Wellington Hampshire, MD;  Location: [facility];  Service: Cardiovascular;  Laterality: N/A;   TEE WITHOUT CARDIOVERSION N/A 11/11/2020   Procedure: TRANSESOPHAGEAL  ECHOCARDIOGRAM (TEE);  Surgeon: Gaye Pollack, MD;  Location: [place];  Service: Open Heart Surgery;  Laterality: N/A;    Current Medications: Current Meds  Medication Sig   acetaminophen (TYLENOL) 500 MG tablet Take 1,000 mg by mouth every 6 (six) hours as needed for moderate pain.   Calcium Carb-Cholecalciferol (CALCIUM 600+D3 PO) Take 1 tablet by mouth in the morning.   Cholecalciferol (VITAMIN D3) 125 MCG (5000 UT) TABS Take 5,000 Units by mouth.   ezetimibe (ZETIA) 10 MG tablet Take 1 tablet (10 mg total) by mouth daily.   ferrous sulfate 325 (65 FE) MG tablet Take 1  tablet (325 mg total) by mouth 2 (two) times daily with a meal.   folic acid (FOLVITE) 1 MG tablet Take 1 mg by mouth in the morning.   hydroxychloroquine (PLAQUENIL) 200 MG tablet Take 400 mg by mouth in the morning.   loratadine (CLARITIN) 10 MG tablet Take 10 mg by mouth in the morning.   metoprolol tartrate (LOPRESSOR) 25 MG tablet Take 0.5 tablets (12.5 mg total) by mouth 2 (two) times daily.   Omega-3 1000 MG CAPS Take 1,000 mg by mouth in the morning.   omeprazole (PRILOSEC) 20 MG capsule Take 20 mg by mouth in the morning.   [DISCONTINUED] aspirin EC 325 MG EC tablet Take 1 tablet (325 mg total) by mouth daily.   [DISCONTINUED] atorvastatin (LIPITOR) 80 MG tablet Take 1 tablet (80 mg total) by mouth daily.     Allergies:   Penicillins   Social History   Socioeconomic History   Marital status: Married    Spouse name: Not on file   Number of children: Not on file   Years of education: Not on file   Highest education level: Not on file  Occupational History   Not on file  Tobacco Use   Smoking status: Never    Passive exposure: Never   Smokeless tobacco: Never  Vaping Use   Vaping Use: Never used  Substance and Sexual Activity   Alcohol use: Never   Drug use: Never   Sexual activity: Not on file  Other Topics Concern   Not on file  Social History Narrative   Not on file   Social Determinants of Health   Financial Resource Strain: Not on file  Food Insecurity: Not on file  Transportation Needs: Not on file  Physical Activity: Not on file  Stress: Not on file  Social Connections: Not on file     Family History: The patient's family history includes Heart attack in her father; Stroke in her father and mother.  ROS:   Please see the history of present illness.     All other systems reviewed and are negative.  EKGs/Labs/Other Studies Reviewed:    The following studies were reviewed today:   EKG:  EKG not  ordered today.    Recent Labs: 11/09/2020: ALT  22 11/12/2020: Magnesium 2.2 11/15/2020: BUN 11; Creatinine, Ser 0.71; Potassium 4.1; Sodium 135 12/07/2020: Hemoglobin 12.4; Platelets 390  Recent Lipid Panel    Component Value Date/Time   CHOL 163 08/16/2020 0728   TRIG 128 08/16/2020 0728   HDL 60 08/16/2020 0728   CHOLHDL 2.7 08/16/2020 0728   VLDL 26 08/16/2020 0728   LDLCALC 77 08/16/2020 0728     Risk Assessment/Calculations:        Physical Exam:    VS:  BP 120/72 (BP Location: Left Arm, Patient Position: Sitting, Cuff Size: Normal)    Pulse 69  Ht 4\' 11"  (1.499 m)    Wt 149 lb (67.6 kg)    SpO2 99%    BMI 30.09 kg/m     Wt Readings from Last 3 Encounters:  02/03/21 149 lb (67.6 kg)  12/14/20 148 lb (67.1 kg)  12/07/20 147 lb 2 oz (66.7 kg)     GEN:  Well nourished, well developed in no acute distress HEENT: Normal NECK: No JVD; No carotid bruits LYMPHATICS: No lymphadenopathy CARDIAC: RRR, no murmurs RESPIRATORY:  Clear to auscultation without rales, wheezing or rhonchi  ABDOMEN: Soft, non-tender, non-distended MUSCULOSKELETAL:  No edema; No deformity  SKIN: Warm and dry NEUROLOGIC:  Alert and oriented x 3 PSYCHIATRIC:  Normal affect   ASSESSMENT:    1. Hx of CABG   2. S/P AVR (aortic valve replacement)   3. Primary hypertension   4. Pure hypercholesterolemia      PLAN:    In order of problems listed above:  CAD s/p CABG x2 SVG-OM, PDA.  LAD 60%.  Denies chest pain.  Okay to decrease aspirin to 81 mg, continue Lipitor daily.  Cardiac rehab order previously placed by surgical team.  Okay to return to work, mild to moderate activities, limit weight to 10 pounds. Aortic valve stenosis s/p bioprosthetic aortic valve replacement.  Echo with normal functioning prosthesis. Hypertension, BP controlled.  Continue Lopressor 12.5 mg twice daily Hyperlipidemia, cholesterol controlled, continue Lipitor 80 mg daily.  Follow-up 6 months.  Shared Decision Making/Informed Consent The risks [stroke (1 in 1000),  death (1 in 1000), kidney failure [usually temporary] (1 in 500), bleeding (1 in 200), allergic reaction [possibly serious] (1 in 200)], benefits (diagnostic support and management of coronary artery disease) and alternatives of a cardiac catheterization were discussed in detail with Ms. Gironda and she is willing to proceed.     Medication Adjustments/Labs and Tests Ordered: Current medicines are reviewed at length with the patient today.  Concerns regarding medicines are outlined above.  No orders of the defined types were placed in this encounter.    Meds ordered this encounter  Medications   atorvastatin (LIPITOR) 80 MG tablet    Sig: Take 1 tablet (80 mg total) by mouth daily.    Dispense:  90 tablet    Refill:  1   aspirin 81 MG EC tablet    Sig: Take 1 tablet (81 mg total) by mouth daily.      Patient Instructions  Medication Instructions:   Your physician has recommended you make the following change in your medication:   DECREASE your Aspirin to 81 MG once a day.  *If you need a refill on your cardiac medications before your next appointment, please call your pharmacy*   Lab Work: None ordered If you have labs (blood work) drawn today and your tests are completely normal, you will receive your results only by: [facility] (if you have MyChart) OR A paper copy in the mail If you have any lab test that is abnormal or we need to change your treatment, we will call you to review the results.   Testing/Procedures: None ordered   Follow-Up: At [facility], you and your health needs are our priority.  As part of our continuing mission to provide you with exceptional heart care, we have created designated Provider Care Teams.  These Care Teams include your primary Cardiologist (physician) and Advanced Practice Providers (APPs -  Physician Assistants and Nurse Practitioners) who all work together to provide you with the care you need,  when you need it.  We  recommend signing up for the patient portal called "MyChart".  Sign up information is provided on this After Visit Summary.  MyChart is used to connect with patients for Virtual Visits (Telemedicine).  Patients are able to view lab/test results, encounter notes, upcoming appointments, etc.  Non-urgent messages can be sent to your provider as well.   To learn more about what you can do with MyChart, go to [email or web address].    Your next appointment:   6 month(s)  The format for your next appointment:   In Person  Provider:   You may see Kate Sable, MD or one of the following Advanced Practice Providers on your designated Care Team:   Murray Hodgkins, NP Christell Faith, PA-C Cadence Jorene Minors    Other Instructions  Referral being faxed to:  [place]  [phone].   Signed, Kate Sable, MD  02/03/2021 12:43 PM    [place]

## 2021-02-03 NOTE — Patient Instructions (Addendum)
Medication Instructions:   Your physician has recommended you make the following change in your medication:   DECREASE your Aspirin to 81 MG once a day.  *If you need a refill on your cardiac medications before your next appointment, please call your pharmacy*   Lab Work: None ordered If you have labs (blood work) drawn today and your tests are completely normal, you will receive your results only by: [place] (if you have MyChart) OR A paper copy in the mail If you have any lab test that is abnormal or we need to change your treatment, we will call you to review the results.   Testing/Procedures: None ordered   Follow-Up: At [facility], you and your health needs are our priority.  As part of our continuing mission to provide you with exceptional heart care, we have created designated Provider Care Teams.  These Care Teams include your primary Cardiologist (physician) and Advanced Practice Providers (APPs -  Physician Assistants and Nurse Practitioners) who all work together to provide you with the care you need, when you need it.  We recommend signing up for the patient portal called "MyChart".  Sign up information is provided on this After Visit Summary.  MyChart is used to connect with patients for Virtual Visits (Telemedicine).  Patients are able to view lab/test results, encounter notes, upcoming appointments, etc.  Non-urgent messages can be sent to your provider as well.   To learn more about what you can do with MyChart, go to [email or web address].    Your next appointment:   6 month(s)  The format for your next appointment:   In Person  Provider:   You may see Kate Sable, MD or one of the following Advanced Practice Providers on your designated Care Team:   Murray Hodgkins, NP Christell Faith, PA-C Cadence Jorene Minors    Other Instructions  Referral being faxed to:  [place]  [phone].

## 2021-02-08 DIAGNOSIS — I350 Nonrheumatic aortic (valve) stenosis: Secondary | ICD-10-CM | POA: Diagnosis not present

## 2021-02-08 DIAGNOSIS — E785 Hyperlipidemia, unspecified: Secondary | ICD-10-CM | POA: Diagnosis not present

## 2021-02-08 DIAGNOSIS — Z951 Presence of aortocoronary bypass graft: Secondary | ICD-10-CM | POA: Diagnosis not present

## 2021-02-08 DIAGNOSIS — I10 Essential (primary) hypertension: Secondary | ICD-10-CM | POA: Diagnosis not present

## 2021-02-09 ENCOUNTER — Telehealth: Payer: Self-pay | Admitting: Cardiology

## 2021-02-09 NOTE — Telephone Encounter (Signed)
Spoke with patient and informed her that the clearance letter to work without lifting restrictions should come from her Cardiac Rehab after they have been able to evaluate her. Patient stated that she has her first appointment tomorrow. Patient verbalized understanding and agreed with plan.

## 2021-02-09 NOTE — Telephone Encounter (Signed)
Patient states the return to work note she received at visit will not work Employment is asking for an ending date  Please fax to Shelley Berg, claim # [phone]

## 2021-02-10 DIAGNOSIS — I10 Essential (primary) hypertension: Secondary | ICD-10-CM | POA: Diagnosis not present

## 2021-02-10 DIAGNOSIS — E785 Hyperlipidemia, unspecified: Secondary | ICD-10-CM | POA: Diagnosis not present

## 2021-02-10 DIAGNOSIS — I350 Nonrheumatic aortic (valve) stenosis: Secondary | ICD-10-CM | POA: Diagnosis not present

## 2021-02-10 DIAGNOSIS — Z951 Presence of aortocoronary bypass graft: Secondary | ICD-10-CM | POA: Diagnosis not present

## 2021-02-13 DIAGNOSIS — I350 Nonrheumatic aortic (valve) stenosis: Secondary | ICD-10-CM | POA: Diagnosis not present

## 2021-02-13 DIAGNOSIS — Z951 Presence of aortocoronary bypass graft: Secondary | ICD-10-CM | POA: Diagnosis not present

## 2021-02-13 DIAGNOSIS — I10 Essential (primary) hypertension: Secondary | ICD-10-CM | POA: Diagnosis not present

## 2021-02-13 DIAGNOSIS — E785 Hyperlipidemia, unspecified: Secondary | ICD-10-CM | POA: Diagnosis not present

## 2021-02-14 ENCOUNTER — Telehealth: Payer: Self-pay

## 2021-02-14 ENCOUNTER — Other Ambulatory Visit: Payer: Self-pay

## 2021-02-14 NOTE — Telephone Encounter (Signed)
Patient is calling stating that her work is not accepting the letter due to not having an end date on it. Patient is requesting a new letter that states she has no restrictions as of 02/11/2021   she will need it faxed to [phone]   ATTN:[facility]

## 2021-02-15 DIAGNOSIS — I10 Essential (primary) hypertension: Secondary | ICD-10-CM | POA: Diagnosis not present

## 2021-02-15 DIAGNOSIS — Z951 Presence of aortocoronary bypass graft: Secondary | ICD-10-CM | POA: Diagnosis not present

## 2021-02-15 DIAGNOSIS — E785 Hyperlipidemia, unspecified: Secondary | ICD-10-CM | POA: Diagnosis not present

## 2021-02-15 DIAGNOSIS — I350 Nonrheumatic aortic (valve) stenosis: Secondary | ICD-10-CM | POA: Diagnosis not present

## 2021-02-15 NOTE — Telephone Encounter (Signed)
Called patient back and informed her to call Dr. Earlene Plater office to request the letter she needs for clearance at work.  Patient verbalized understanding and agreed with plan.

## 2021-02-17 DIAGNOSIS — I350 Nonrheumatic aortic (valve) stenosis: Secondary | ICD-10-CM | POA: Diagnosis not present

## 2021-02-17 DIAGNOSIS — E785 Hyperlipidemia, unspecified: Secondary | ICD-10-CM | POA: Diagnosis not present

## 2021-02-17 DIAGNOSIS — I10 Essential (primary) hypertension: Secondary | ICD-10-CM | POA: Diagnosis not present

## 2021-02-17 DIAGNOSIS — Z951 Presence of aortocoronary bypass graft: Secondary | ICD-10-CM | POA: Diagnosis not present

## 2021-02-20 DIAGNOSIS — I10 Essential (primary) hypertension: Secondary | ICD-10-CM | POA: Diagnosis not present

## 2021-02-20 DIAGNOSIS — E785 Hyperlipidemia, unspecified: Secondary | ICD-10-CM | POA: Diagnosis not present

## 2021-02-20 DIAGNOSIS — I350 Nonrheumatic aortic (valve) stenosis: Secondary | ICD-10-CM | POA: Diagnosis not present

## 2021-02-20 DIAGNOSIS — Z951 Presence of aortocoronary bypass graft: Secondary | ICD-10-CM | POA: Diagnosis not present

## 2021-02-22 DIAGNOSIS — I10 Essential (primary) hypertension: Secondary | ICD-10-CM | POA: Diagnosis not present

## 2021-02-22 DIAGNOSIS — I350 Nonrheumatic aortic (valve) stenosis: Secondary | ICD-10-CM | POA: Diagnosis not present

## 2021-02-22 DIAGNOSIS — E785 Hyperlipidemia, unspecified: Secondary | ICD-10-CM | POA: Diagnosis not present

## 2021-02-22 DIAGNOSIS — Z951 Presence of aortocoronary bypass graft: Secondary | ICD-10-CM | POA: Diagnosis not present

## 2021-02-23 DIAGNOSIS — Z9181 History of falling: Secondary | ICD-10-CM | POA: Diagnosis not present

## 2021-02-23 DIAGNOSIS — Z0000 Encounter for general adult medical examination without abnormal findings: Secondary | ICD-10-CM | POA: Diagnosis not present

## 2021-02-23 DIAGNOSIS — E785 Hyperlipidemia, unspecified: Secondary | ICD-10-CM | POA: Diagnosis not present

## 2021-02-24 DIAGNOSIS — Z951 Presence of aortocoronary bypass graft: Secondary | ICD-10-CM | POA: Diagnosis not present

## 2021-02-24 DIAGNOSIS — I350 Nonrheumatic aortic (valve) stenosis: Secondary | ICD-10-CM | POA: Diagnosis not present

## 2021-02-24 DIAGNOSIS — E785 Hyperlipidemia, unspecified: Secondary | ICD-10-CM | POA: Diagnosis not present

## 2021-02-24 DIAGNOSIS — I10 Essential (primary) hypertension: Secondary | ICD-10-CM | POA: Diagnosis not present

## 2021-02-27 DIAGNOSIS — E785 Hyperlipidemia, unspecified: Secondary | ICD-10-CM | POA: Diagnosis not present

## 2021-02-27 DIAGNOSIS — I10 Essential (primary) hypertension: Secondary | ICD-10-CM | POA: Diagnosis not present

## 2021-02-27 DIAGNOSIS — Z951 Presence of aortocoronary bypass graft: Secondary | ICD-10-CM | POA: Diagnosis not present

## 2021-02-27 DIAGNOSIS — I350 Nonrheumatic aortic (valve) stenosis: Secondary | ICD-10-CM | POA: Diagnosis not present

## 2021-03-01 DIAGNOSIS — I350 Nonrheumatic aortic (valve) stenosis: Secondary | ICD-10-CM | POA: Diagnosis not present

## 2021-03-01 DIAGNOSIS — E785 Hyperlipidemia, unspecified: Secondary | ICD-10-CM | POA: Diagnosis not present

## 2021-03-01 DIAGNOSIS — Z951 Presence of aortocoronary bypass graft: Secondary | ICD-10-CM | POA: Diagnosis not present

## 2021-03-01 DIAGNOSIS — I10 Essential (primary) hypertension: Secondary | ICD-10-CM | POA: Diagnosis not present

## 2021-03-01 DIAGNOSIS — Z955 Presence of coronary angioplasty implant and graft: Secondary | ICD-10-CM | POA: Diagnosis not present

## 2021-03-02 DIAGNOSIS — M1990 Unspecified osteoarthritis, unspecified site: Secondary | ICD-10-CM | POA: Diagnosis not present

## 2021-03-02 DIAGNOSIS — M8580 Other specified disorders of bone density and structure, unspecified site: Secondary | ICD-10-CM | POA: Diagnosis not present

## 2021-03-02 DIAGNOSIS — M0600 Rheumatoid arthritis without rheumatoid factor, unspecified site: Secondary | ICD-10-CM | POA: Diagnosis not present

## 2021-03-02 DIAGNOSIS — Z79899 Other long term (current) drug therapy: Secondary | ICD-10-CM | POA: Diagnosis not present

## 2021-03-02 DIAGNOSIS — R768 Other specified abnormal immunological findings in serum: Secondary | ICD-10-CM | POA: Diagnosis not present

## 2021-03-03 DIAGNOSIS — E785 Hyperlipidemia, unspecified: Secondary | ICD-10-CM | POA: Diagnosis not present

## 2021-03-03 DIAGNOSIS — Z951 Presence of aortocoronary bypass graft: Secondary | ICD-10-CM | POA: Diagnosis not present

## 2021-03-03 DIAGNOSIS — I10 Essential (primary) hypertension: Secondary | ICD-10-CM | POA: Diagnosis not present

## 2021-03-03 DIAGNOSIS — I350 Nonrheumatic aortic (valve) stenosis: Secondary | ICD-10-CM | POA: Diagnosis not present

## 2021-03-03 DIAGNOSIS — Z955 Presence of coronary angioplasty implant and graft: Secondary | ICD-10-CM | POA: Diagnosis not present

## 2021-03-06 DIAGNOSIS — I350 Nonrheumatic aortic (valve) stenosis: Secondary | ICD-10-CM | POA: Diagnosis not present

## 2021-03-06 DIAGNOSIS — Z951 Presence of aortocoronary bypass graft: Secondary | ICD-10-CM | POA: Diagnosis not present

## 2021-03-06 DIAGNOSIS — I10 Essential (primary) hypertension: Secondary | ICD-10-CM | POA: Diagnosis not present

## 2021-03-06 DIAGNOSIS — E785 Hyperlipidemia, unspecified: Secondary | ICD-10-CM | POA: Diagnosis not present

## 2021-03-06 DIAGNOSIS — Z955 Presence of coronary angioplasty implant and graft: Secondary | ICD-10-CM | POA: Diagnosis not present

## 2021-03-08 DIAGNOSIS — Z951 Presence of aortocoronary bypass graft: Secondary | ICD-10-CM | POA: Diagnosis not present

## 2021-03-08 DIAGNOSIS — E785 Hyperlipidemia, unspecified: Secondary | ICD-10-CM | POA: Diagnosis not present

## 2021-03-08 DIAGNOSIS — Z955 Presence of coronary angioplasty implant and graft: Secondary | ICD-10-CM | POA: Diagnosis not present

## 2021-03-08 DIAGNOSIS — I10 Essential (primary) hypertension: Secondary | ICD-10-CM | POA: Diagnosis not present

## 2021-03-08 DIAGNOSIS — I350 Nonrheumatic aortic (valve) stenosis: Secondary | ICD-10-CM | POA: Diagnosis not present

## 2021-03-10 DIAGNOSIS — Z955 Presence of coronary angioplasty implant and graft: Secondary | ICD-10-CM | POA: Diagnosis not present

## 2021-03-10 DIAGNOSIS — I10 Essential (primary) hypertension: Secondary | ICD-10-CM | POA: Diagnosis not present

## 2021-03-10 DIAGNOSIS — I350 Nonrheumatic aortic (valve) stenosis: Secondary | ICD-10-CM | POA: Diagnosis not present

## 2021-03-10 DIAGNOSIS — Z951 Presence of aortocoronary bypass graft: Secondary | ICD-10-CM | POA: Diagnosis not present

## 2021-03-10 DIAGNOSIS — E785 Hyperlipidemia, unspecified: Secondary | ICD-10-CM | POA: Diagnosis not present

## 2021-03-13 DIAGNOSIS — Z955 Presence of coronary angioplasty implant and graft: Secondary | ICD-10-CM | POA: Diagnosis not present

## 2021-03-13 DIAGNOSIS — I10 Essential (primary) hypertension: Secondary | ICD-10-CM | POA: Diagnosis not present

## 2021-03-13 DIAGNOSIS — Z951 Presence of aortocoronary bypass graft: Secondary | ICD-10-CM | POA: Diagnosis not present

## 2021-03-13 DIAGNOSIS — E785 Hyperlipidemia, unspecified: Secondary | ICD-10-CM | POA: Diagnosis not present

## 2021-03-13 DIAGNOSIS — I350 Nonrheumatic aortic (valve) stenosis: Secondary | ICD-10-CM | POA: Diagnosis not present

## 2021-03-15 DIAGNOSIS — I350 Nonrheumatic aortic (valve) stenosis: Secondary | ICD-10-CM | POA: Diagnosis not present

## 2021-03-15 DIAGNOSIS — I10 Essential (primary) hypertension: Secondary | ICD-10-CM | POA: Diagnosis not present

## 2021-03-15 DIAGNOSIS — E785 Hyperlipidemia, unspecified: Secondary | ICD-10-CM | POA: Diagnosis not present

## 2021-03-15 DIAGNOSIS — Z955 Presence of coronary angioplasty implant and graft: Secondary | ICD-10-CM | POA: Diagnosis not present

## 2021-03-15 DIAGNOSIS — Z951 Presence of aortocoronary bypass graft: Secondary | ICD-10-CM | POA: Diagnosis not present

## 2021-03-17 DIAGNOSIS — Z955 Presence of coronary angioplasty implant and graft: Secondary | ICD-10-CM | POA: Diagnosis not present

## 2021-03-17 DIAGNOSIS — I350 Nonrheumatic aortic (valve) stenosis: Secondary | ICD-10-CM | POA: Diagnosis not present

## 2021-03-17 DIAGNOSIS — I10 Essential (primary) hypertension: Secondary | ICD-10-CM | POA: Diagnosis not present

## 2021-03-17 DIAGNOSIS — E785 Hyperlipidemia, unspecified: Secondary | ICD-10-CM | POA: Diagnosis not present

## 2021-03-17 DIAGNOSIS — Z951 Presence of aortocoronary bypass graft: Secondary | ICD-10-CM | POA: Diagnosis not present

## 2021-03-20 DIAGNOSIS — E785 Hyperlipidemia, unspecified: Secondary | ICD-10-CM | POA: Diagnosis not present

## 2021-03-20 DIAGNOSIS — I10 Essential (primary) hypertension: Secondary | ICD-10-CM | POA: Diagnosis not present

## 2021-03-20 DIAGNOSIS — I350 Nonrheumatic aortic (valve) stenosis: Secondary | ICD-10-CM | POA: Diagnosis not present

## 2021-03-20 DIAGNOSIS — Z951 Presence of aortocoronary bypass graft: Secondary | ICD-10-CM | POA: Diagnosis not present

## 2021-03-20 DIAGNOSIS — Z955 Presence of coronary angioplasty implant and graft: Secondary | ICD-10-CM | POA: Diagnosis not present

## 2021-03-22 DIAGNOSIS — Z951 Presence of aortocoronary bypass graft: Secondary | ICD-10-CM | POA: Diagnosis not present

## 2021-03-22 DIAGNOSIS — I350 Nonrheumatic aortic (valve) stenosis: Secondary | ICD-10-CM | POA: Diagnosis not present

## 2021-03-22 DIAGNOSIS — I10 Essential (primary) hypertension: Secondary | ICD-10-CM | POA: Diagnosis not present

## 2021-03-22 DIAGNOSIS — Z955 Presence of coronary angioplasty implant and graft: Secondary | ICD-10-CM | POA: Diagnosis not present

## 2021-03-22 DIAGNOSIS — E785 Hyperlipidemia, unspecified: Secondary | ICD-10-CM | POA: Diagnosis not present

## 2021-03-24 DIAGNOSIS — Z955 Presence of coronary angioplasty implant and graft: Secondary | ICD-10-CM | POA: Diagnosis not present

## 2021-03-24 DIAGNOSIS — I10 Essential (primary) hypertension: Secondary | ICD-10-CM | POA: Diagnosis not present

## 2021-03-24 DIAGNOSIS — I350 Nonrheumatic aortic (valve) stenosis: Secondary | ICD-10-CM | POA: Diagnosis not present

## 2021-03-24 DIAGNOSIS — E785 Hyperlipidemia, unspecified: Secondary | ICD-10-CM | POA: Diagnosis not present

## 2021-03-24 DIAGNOSIS — Z951 Presence of aortocoronary bypass graft: Secondary | ICD-10-CM | POA: Diagnosis not present

## 2021-03-27 DIAGNOSIS — I10 Essential (primary) hypertension: Secondary | ICD-10-CM | POA: Diagnosis not present

## 2021-03-27 DIAGNOSIS — E785 Hyperlipidemia, unspecified: Secondary | ICD-10-CM | POA: Diagnosis not present

## 2021-03-27 DIAGNOSIS — Z951 Presence of aortocoronary bypass graft: Secondary | ICD-10-CM | POA: Diagnosis not present

## 2021-03-27 DIAGNOSIS — I350 Nonrheumatic aortic (valve) stenosis: Secondary | ICD-10-CM | POA: Diagnosis not present

## 2021-03-27 DIAGNOSIS — Z955 Presence of coronary angioplasty implant and graft: Secondary | ICD-10-CM | POA: Diagnosis not present

## 2021-03-29 DIAGNOSIS — Z951 Presence of aortocoronary bypass graft: Secondary | ICD-10-CM | POA: Diagnosis not present

## 2021-03-29 DIAGNOSIS — Z955 Presence of coronary angioplasty implant and graft: Secondary | ICD-10-CM | POA: Diagnosis not present

## 2021-03-29 DIAGNOSIS — I350 Nonrheumatic aortic (valve) stenosis: Secondary | ICD-10-CM | POA: Diagnosis not present

## 2021-03-29 DIAGNOSIS — E785 Hyperlipidemia, unspecified: Secondary | ICD-10-CM | POA: Diagnosis not present

## 2021-03-29 DIAGNOSIS — I10 Essential (primary) hypertension: Secondary | ICD-10-CM | POA: Diagnosis not present

## 2021-03-30 DIAGNOSIS — E785 Hyperlipidemia, unspecified: Secondary | ICD-10-CM | POA: Diagnosis not present

## 2021-03-30 DIAGNOSIS — Z951 Presence of aortocoronary bypass graft: Secondary | ICD-10-CM | POA: Diagnosis not present

## 2021-03-30 DIAGNOSIS — Z955 Presence of coronary angioplasty implant and graft: Secondary | ICD-10-CM | POA: Diagnosis not present

## 2021-03-30 DIAGNOSIS — I10 Essential (primary) hypertension: Secondary | ICD-10-CM | POA: Diagnosis not present

## 2021-03-30 DIAGNOSIS — I350 Nonrheumatic aortic (valve) stenosis: Secondary | ICD-10-CM | POA: Diagnosis not present

## 2021-04-03 DIAGNOSIS — Z951 Presence of aortocoronary bypass graft: Secondary | ICD-10-CM | POA: Diagnosis not present

## 2021-04-04 DIAGNOSIS — L400 Psoriasis vulgaris: Secondary | ICD-10-CM | POA: Diagnosis not present

## 2021-04-04 DIAGNOSIS — Z951 Presence of aortocoronary bypass graft: Secondary | ICD-10-CM | POA: Diagnosis not present

## 2021-04-04 DIAGNOSIS — Z79899 Other long term (current) drug therapy: Secondary | ICD-10-CM | POA: Diagnosis not present

## 2021-04-04 DIAGNOSIS — Z85828 Personal history of other malignant neoplasm of skin: Secondary | ICD-10-CM | POA: Diagnosis not present

## 2021-04-05 DIAGNOSIS — Z951 Presence of aortocoronary bypass graft: Secondary | ICD-10-CM | POA: Diagnosis not present

## 2021-04-10 DIAGNOSIS — E1169 Type 2 diabetes mellitus with other specified complication: Secondary | ICD-10-CM | POA: Diagnosis not present

## 2021-04-10 DIAGNOSIS — Z79899 Other long term (current) drug therapy: Secondary | ICD-10-CM | POA: Diagnosis not present

## 2021-04-10 DIAGNOSIS — E782 Mixed hyperlipidemia: Secondary | ICD-10-CM | POA: Diagnosis not present

## 2021-04-10 DIAGNOSIS — Z951 Presence of aortocoronary bypass graft: Secondary | ICD-10-CM | POA: Diagnosis not present

## 2021-04-12 DIAGNOSIS — Z951 Presence of aortocoronary bypass graft: Secondary | ICD-10-CM | POA: Diagnosis not present

## 2021-04-13 DIAGNOSIS — E1169 Type 2 diabetes mellitus with other specified complication: Secondary | ICD-10-CM | POA: Diagnosis not present

## 2021-04-13 DIAGNOSIS — I10 Essential (primary) hypertension: Secondary | ICD-10-CM | POA: Diagnosis not present

## 2021-04-13 DIAGNOSIS — I2510 Atherosclerotic heart disease of native coronary artery without angina pectoris: Secondary | ICD-10-CM | POA: Diagnosis not present

## 2021-04-13 DIAGNOSIS — K219 Gastro-esophageal reflux disease without esophagitis: Secondary | ICD-10-CM | POA: Diagnosis not present

## 2021-04-13 DIAGNOSIS — M0600 Rheumatoid arthritis without rheumatoid factor, unspecified site: Secondary | ICD-10-CM | POA: Diagnosis not present

## 2021-04-13 DIAGNOSIS — E782 Mixed hyperlipidemia: Secondary | ICD-10-CM | POA: Diagnosis not present

## 2021-04-13 DIAGNOSIS — L409 Psoriasis, unspecified: Secondary | ICD-10-CM | POA: Diagnosis not present

## 2021-04-13 DIAGNOSIS — E785 Hyperlipidemia, unspecified: Secondary | ICD-10-CM | POA: Diagnosis not present

## 2021-04-14 ENCOUNTER — Telehealth: Payer: Self-pay | Admitting: *Deleted

## 2021-04-14 DIAGNOSIS — Z951 Presence of aortocoronary bypass graft: Secondary | ICD-10-CM | POA: Diagnosis not present

## 2021-04-14 NOTE — Telephone Encounter (Signed)
Patient contacted the office requesting antibiotics prior to dental cleaning. Advised patient to contact cardiologist for prescription. Patient verbalized understanding.  ?

## 2021-04-17 ENCOUNTER — Telehealth: Payer: Self-pay | Admitting: Cardiology

## 2021-04-17 DIAGNOSIS — Z951 Presence of aortocoronary bypass graft: Secondary | ICD-10-CM | POA: Diagnosis not present

## 2021-04-17 MED ORDER — AZITHROMYCIN 500 MG PO TABS: 500.0000 mg | ORAL_TABLET | Freq: Once | ORAL | 0 refills | Status: AC

## 2021-04-17 NOTE — Telephone Encounter (Signed)
Patient's daughter calling. She states the patient had a valve replacement and would like the recommendation of no pre medication to be faced to her. Fax: [phone] ? ? ?

## 2021-04-17 NOTE — Telephone Encounter (Signed)
Pt c/o medication issue: ? ?1. Name of Medication: does not know name of medication ? ?2. How are you currently taking this medication (dosage and times per day)? Not currently taking ? ?3. Are you having a reaction (difficulty breathing--STAT)? no ? ?4. What is your medication issue? Patient states she needs to be premedicated for her dentist appointment Thursday.  ?

## 2021-04-17 NOTE — Telephone Encounter (Signed)
Spoke with Dr. Garen Lah and he recommended that the patient take Azithromycin 500 MG X 1 tab 30-60 mins prior to dental procedure.  ? ?Called patients daughter and informed her of this. She was grateful for the call back. ?

## 2021-04-17 NOTE — Telephone Encounter (Signed)
Spoke with Dr. Garen Lah and he advised that the patient does not need to be pre-medicated for any dental procedure. Called patient and made her aware. Patient was very grateful for the call back. ?

## 2021-04-17 NOTE — Addendum Note (Signed)
Addended by: Kavin Leech on: 04/17/2021 03:23 PM ? ? Modules accepted: Orders ? ?

## 2021-04-18 DIAGNOSIS — H00021 Hordeolum internum right upper eyelid: Secondary | ICD-10-CM | POA: Diagnosis not present

## 2021-04-19 DIAGNOSIS — Z951 Presence of aortocoronary bypass graft: Secondary | ICD-10-CM | POA: Diagnosis not present

## 2021-04-21 DIAGNOSIS — Z951 Presence of aortocoronary bypass graft: Secondary | ICD-10-CM | POA: Diagnosis not present

## 2021-04-24 DIAGNOSIS — Z951 Presence of aortocoronary bypass graft: Secondary | ICD-10-CM | POA: Diagnosis not present

## 2021-04-26 DIAGNOSIS — Z951 Presence of aortocoronary bypass graft: Secondary | ICD-10-CM | POA: Diagnosis not present

## 2021-05-01 DIAGNOSIS — Z951 Presence of aortocoronary bypass graft: Secondary | ICD-10-CM | POA: Diagnosis not present

## 2021-05-03 DIAGNOSIS — Z951 Presence of aortocoronary bypass graft: Secondary | ICD-10-CM | POA: Diagnosis not present

## 2021-05-18 DIAGNOSIS — Z1231 Encounter for screening mammogram for malignant neoplasm of breast: Secondary | ICD-10-CM | POA: Diagnosis not present

## 2021-06-01 DIAGNOSIS — M8589 Other specified disorders of bone density and structure, multiple sites: Secondary | ICD-10-CM | POA: Diagnosis not present

## 2021-06-01 DIAGNOSIS — Z79899 Other long term (current) drug therapy: Secondary | ICD-10-CM | POA: Diagnosis not present

## 2021-06-01 DIAGNOSIS — M8580 Other specified disorders of bone density and structure, unspecified site: Secondary | ICD-10-CM | POA: Diagnosis not present

## 2021-06-01 DIAGNOSIS — M0600 Rheumatoid arthritis without rheumatoid factor, unspecified site: Secondary | ICD-10-CM | POA: Diagnosis not present

## 2021-06-01 DIAGNOSIS — M1990 Unspecified osteoarthritis, unspecified site: Secondary | ICD-10-CM | POA: Diagnosis not present

## 2021-06-01 DIAGNOSIS — L4050 Arthropathic psoriasis, unspecified: Secondary | ICD-10-CM | POA: Diagnosis not present

## 2021-06-16 ENCOUNTER — Other Ambulatory Visit: Payer: Self-pay

## 2021-06-16 MED ORDER — ATORVASTATIN CALCIUM 80 MG PO TABS
80.0000 mg | ORAL_TABLET | Freq: Every day | ORAL | 3 refills | Status: DC
Start: 2021-06-16 — End: 2022-02-16

## 2021-07-27 ENCOUNTER — Encounter: Payer: Self-pay | Admitting: *Deleted

## 2021-07-27 ENCOUNTER — Other Ambulatory Visit: Payer: Self-pay | Admitting: Nurse Practitioner

## 2021-08-03 ENCOUNTER — Encounter: Payer: Self-pay | Admitting: Cardiology

## 2021-08-03 ENCOUNTER — Ambulatory Visit: Payer: Medicare Other | Admitting: Cardiology

## 2021-08-03 VITALS — BP 128/80 | HR 71 | Ht 59.0 in | Wt 145.4 lb

## 2021-08-03 DIAGNOSIS — Z952 Presence of prosthetic heart valve: Secondary | ICD-10-CM

## 2021-08-03 DIAGNOSIS — E7800 Pure hypercholesterolemia, unspecified: Secondary | ICD-10-CM | POA: Diagnosis not present

## 2021-08-03 DIAGNOSIS — I10 Essential (primary) hypertension: Secondary | ICD-10-CM

## 2021-08-03 DIAGNOSIS — Z951 Presence of aortocoronary bypass graft: Secondary | ICD-10-CM | POA: Diagnosis not present

## 2021-08-03 NOTE — Progress Notes (Signed)
Cardiology Office Note:    Date:  08/03/2021   ID:  Shelley Berg, DOB Aug 05, 1945, MRN [id number]  PCP:  Leonides Sake, MD   [facility] Providers Cardiologist:  Kate Sable, MD     Referring MD: Leonides Sake, MD   Chief Complaint  Patient presents with   Follow-up    6 month F/U-"doing well." Medications reviewed by the patient's home medication list.     History of Present Illness:    Shelley Berg is a 76 y.o. female with a hx of CAD s/p CABG x 2 11/2020 (SVG-OM, SVG -PDA), severe AS s/p AVR bioprosthetic 11/2020, hypertension, hyperlipidemia presents for follow-up.    Being seen due to CAD and aortic valve replacement.  Has undergone cardiac rehab successfully, feels well, surgical scar is adequately healed.  Able to perform her activities of daily living without any symptoms.  Occasionally has swelling in her legs after she has been standing for too long.  She otherwise feels well, has no concerns at this time.  Tolerating medications including aspirin, Lipitor, Lopressor as prescribed.   Prior notes LHC 08/2020 proximal LAD 60%, mid LAD 40%, mid RCA 100%, mid left circumflex 85%. Echo 05/2020 EF 70 to 75%, moderate aortic stenosis, impaired relaxation  family history of MI in her dad age 50s, 2 brothers had bypass surgeries around age 30s.   Past Medical History:  Diagnosis Date   Aortic stenosis    a. 05/2020 Echo: EF 70-75%, GrI DD, mod-sev AS; b. 08/2020 Cath: AS peak grad 42mHg w/ AvA 1.00 cm^2; c. 11/2020 SAVR: s/p 237mEdwards INSPIRIS RESILIA pericardial valve.   Asthma    as a child, no problems as an adult, no inhalers   CAD (coronary artery disease)    a. 08/2020 Cath: LM nl, LAD 60p, 4080mCX 30ost/p, 76m92mA 100m 74m->R collats filling RPDA from LAD; b. 11/2020 CABG x 2 (w/ AVR): VG->OM, VG->RPDA.   Dyspnea    with exertion   GERD (gastroesophageal reflux disease)    Heart murmur    Hyperlipidemia    Hypertension    Obesity     Pre-diabetes    Rheumatoid arthritis (HCC) [place]     Past Surgical History:  Procedure Laterality Date   AORTIC VALVE REPLACEMENT N/A 11/11/2020   Procedure: AORTIC VALVE REPLACEMENT (AVR) WITH INSPIRIS RESILIA AORTIC VALVE SIZE 21MM;  Surgeon: BartlGaye Pollack  [facility] [place];  Laterality: N/A;   CORONARY ARTERY BYPASS GRAFT N/A 11/11/2020   Procedure: CORONARY ARTERY BYPASS GRAFTING (CABG) X  2 ON PUMP USING RIGHT ENDOSCOPIC GREATER SAPHENOUS VEIN CONDUITS;  Surgeon: BartlGaye Pollack  [place] [place];  Laterality: N/A;   ENDOVEIN HARVEST OF GREATER SAPHENOUS VEIN Right 11/11/2020   Procedure: ENDOVEIN HARVEST OF GREATER SAPHENOUS VEIN;  Surgeon: BartlGaye Pollack  [facility] [place];  Laterality: Right;   RIGHT/LEFT HEART CATH AND CORONARY ANGIOGRAPHY N/A 08/10/2020   Procedure: RIGHT/LEFT HEART CATH AND CORONARY ANGIOGRAPHY;  Surgeon: AridaWellington Hampshire  [facility] [place];  Service: Cardiovascular;  Laterality: N/A;   TEE WITHOUT CARDIOVERSION N/A 11/11/2020   Procedure: TRANSESOPHAGEAL ECHOCARDIOGRAM (TEE);  Surgeon: BartlGaye Pollack  [place] [place] [place] [facility];  Laterality: N/A;    Current Medications: Current Meds  Medication Sig   acetaminophen (TYLENOL) 500 MG tablet  Take 1,000 mg by mouth every 6 (six) hours as needed for moderate pain.   alendronate (FOSAMAX) 70 MG tablet 1 TABLET 30 MINUTES BEFORE THE FIRST FOOD, BEVERAGE OR MEDICINE OF THE DAY WITH PLAIN WATER ORALLY ONCE A WEEK 90 DAYS   aspirin 81 MG EC tablet Take 1 tablet (81 mg total) by mouth daily.   atorvastatin (LIPITOR) 80 MG tablet Take 1 tablet (80 mg total) by mouth daily.   Calcium Carb-Cholecalciferol (CALCIUM 600+D3 PO) Take 1 tablet by mouth in the morning.   Cholecalciferol (VITAMIN D3) 125 MCG (5000 UT) TABS Take 5,000 Units by mouth.   ezetimibe (ZETIA) 10  MG tablet TAKE 1 TABLET BY MOUTH DAILY   folic acid (FOLVITE) 1 MG tablet Take 1 mg by mouth in the morning.   hydroxychloroquine (PLAQUENIL) 200 MG tablet Take 400 mg by mouth in the morning.   loratadine (CLARITIN) 10 MG tablet Take 10 mg by mouth in the morning.   methotrexate (RHEUMATREX) 2.5 MG tablet Take 2.5 mg by mouth once a week.   metoprolol tartrate (LOPRESSOR) 25 MG tablet TAKE ONE-HALF TABLET BY MOUTH  TWICE DAILY   Omega-3 1000 MG CAPS Take 1,000 mg by mouth in the morning.   omeprazole (PRILOSEC) 20 MG capsule Take 20 mg by mouth in the morning.   OTEZLA 30 MG TABS Take 1 tablet by mouth 2 (two) times daily.     Allergies:   Penicillin g, Penicillin g sodium, and Penicillins   Social History   Socioeconomic History   Marital status: Married    Spouse name: Not on file   Number of children: Not on file   Years of education: Not on file   Highest education level: Not on file  Occupational History   Not on file  Tobacco Use   Smoking status: Never    Passive exposure: Never   Smokeless tobacco: Never  Vaping Use   Vaping Use: Never used  Substance and Sexual Activity   Alcohol use: Never   Drug use: Never   Sexual activity: Not on file  Other Topics Concern   Not on file  Social History Narrative   Not on file   Social Determinants of Health   Financial Resource Strain: Not on file  Food Insecurity: Not on file  Transportation Needs: Not on file  Physical Activity: Not on file  Stress: Not on file  Social Connections: Not on file     Family History: The patient's family history includes Heart attack in her father; Stroke in her father and mother.  ROS:   Please see the history of present illness.     All other systems reviewed and are negative.  EKGs/Labs/Other Studies Reviewed:    The following studies were reviewed today:   EKG:  EKG is ordered today.  EKG shows normal sinus rhythm, possible old inferior infarct.  Recent Labs: 11/09/2020:  ALT 22 11/12/2020: Magnesium 2.2 11/15/2020: BUN 11; Creatinine, Ser 0.71; Potassium 4.1; Sodium 135 12/07/2020: Hemoglobin 12.4; Platelets 390  Recent Lipid Panel    Component Value Date/Time   CHOL 163 08/16/2020 0728   TRIG 128 08/16/2020 0728   HDL 60 08/16/2020 0728   CHOLHDL 2.7 08/16/2020 0728   VLDL 26 08/16/2020 0728   LDLCALC 77 08/16/2020 0728     Risk Assessment/Calculations:        Physical Exam:    VS:  BP 128/80 (BP Location: Left Arm, Patient Position: Sitting, Cuff Size: Normal)   Pulse  71   Ht '4\' 11"'$  (1.499 m)   Wt 145 lb 6 oz (65.9 kg)   SpO2 98%   BMI 29.36 kg/m     Wt Readings from Last 3 Encounters:  08/03/21 145 lb 6 oz (65.9 kg)  02/03/21 149 lb (67.6 kg)  12/14/20 148 lb (67.1 kg)     GEN:  Well nourished, well developed in no acute distress HEENT: Normal NECK: No JVD; No carotid bruits CARDIAC: RRR, no murmurs RESPIRATORY:  Clear to auscultation without rales, wheezing or rhonchi  ABDOMEN: Soft, non-tender, non-distended MUSCULOSKELETAL:  No edema; No deformity  SKIN: Warm and dry NEUROLOGIC:  Alert and oriented x 3 PSYCHIATRIC:  Normal affect   ASSESSMENT:    1. Hx of CABG   2. S/P AVR (aortic valve replacement)   3. Primary hypertension   4. Pure hypercholesterolemia    PLAN:    In order of problems listed above:  CAD s/p CABG x2 SVG-OM, PDA.  LAD 60%.  Denies chest pain.  Continue aspirin to 81 mg,  Lipitor daily.  Aortic valve stenosis s/p AVR -bioprosthetic. last Echo with normal functioning prosthesis. Hypertension, BP controlled.  Continue Lopressor 12.5 mg twice daily Hyperlipidemia, cholesterol controlled, continue Lipitor 80 mg daily.  Follow-up yearly.   Medication Adjustments/Labs and Tests Ordered: Current medicines are reviewed at length with the patient today.  Concerns regarding medicines are outlined above.  Orders Placed This Encounter  Procedures   EKG 12-Lead     No orders of the defined types were  placed in this encounter.     Patient Instructions  Medication Instructions:   Your physician recommends that you continue on your current medications as directed. Please refer to the Current Medication list given to you today.   *If you need a refill on your cardiac medications before your next appointment, please call your pharmacy*    Follow-Up: At [facility], you and your health needs are our priority.  As part of our continuing mission to provide you with exceptional heart care, we have created designated Provider Care Teams.  These Care Teams include your primary Cardiologist (physician) and Advanced Practice Providers (APPs -  Physician Assistants and Nurse Practitioners) who all work together to provide you with the care you need, when you need it.  We recommend signing up for the patient portal called "MyChart".  Sign up information is provided on this After Visit Summary.  MyChart is used to connect with patients for Virtual Visits (Telemedicine).  Patients are able to view lab/test results, encounter notes, upcoming appointments, etc.  Non-urgent messages can be sent to your provider as well.   To learn more about what you can do with MyChart, go to [email or web address].    Your next appointment:   1 year(s)  The format for your next appointment:   In Person  Provider:   Kate Sable, MD    Other Instructions   Important Information About Sugar         Signed, Kate Sable, MD  08/03/2021 1:36 PM    [place]

## 2021-08-03 NOTE — Patient Instructions (Signed)
Medication Instructions:   Your physician recommends that you continue on your current medications as directed. Please refer to the Current Medication list given to you today.   *If you need a refill on your cardiac medications before your next appointment, please call your pharmacy*  Follow-Up: At [facility], you and your health needs are our priority.  As part of our continuing mission to provide you with exceptional heart care, we have created designated Provider Care Teams.  These Care Teams include your primary Cardiologist (physician) and Advanced Practice Providers (APPs -  Physician Assistants and Nurse Practitioners) who all work together to provide you with the care you need, when you need it.  We recommend signing up for the patient portal called "MyChart".  Sign up information is provided on this After Visit Summary.  MyChart is used to connect with patients for Virtual Visits (Telemedicine).  Patients are able to view lab/test results, encounter notes, upcoming appointments, etc.  Non-urgent messages can be sent to your provider as well.   To learn more about what you can do with MyChart, go to [email or web address].    Your next appointment:   1 year(s)  The format for your next appointment:   In Person  Provider:   Brian Agbor-Etang, MD    Other Instructions   Important Information About Sugar       

## 2021-08-10 DIAGNOSIS — M8580 Other specified disorders of bone density and structure, unspecified site: Secondary | ICD-10-CM | POA: Diagnosis not present

## 2021-08-10 DIAGNOSIS — M1990 Unspecified osteoarthritis, unspecified site: Secondary | ICD-10-CM | POA: Diagnosis not present

## 2021-08-10 DIAGNOSIS — Z79899 Other long term (current) drug therapy: Secondary | ICD-10-CM | POA: Diagnosis not present

## 2021-08-10 DIAGNOSIS — L4050 Arthropathic psoriasis, unspecified: Secondary | ICD-10-CM | POA: Diagnosis not present

## 2021-08-10 DIAGNOSIS — M0600 Rheumatoid arthritis without rheumatoid factor, unspecified site: Secondary | ICD-10-CM | POA: Diagnosis not present

## 2021-09-11 ENCOUNTER — Other Ambulatory Visit: Payer: Self-pay | Admitting: Nurse Practitioner

## 2021-09-28 DIAGNOSIS — H2513 Age-related nuclear cataract, bilateral: Secondary | ICD-10-CM | POA: Diagnosis not present

## 2021-09-28 DIAGNOSIS — E119 Type 2 diabetes mellitus without complications: Secondary | ICD-10-CM | POA: Diagnosis not present

## 2021-10-05 DIAGNOSIS — L570 Actinic keratosis: Secondary | ICD-10-CM | POA: Diagnosis not present

## 2021-10-05 DIAGNOSIS — Z85828 Personal history of other malignant neoplasm of skin: Secondary | ICD-10-CM | POA: Diagnosis not present

## 2021-10-05 DIAGNOSIS — Z79899 Other long term (current) drug therapy: Secondary | ICD-10-CM | POA: Diagnosis not present

## 2021-10-05 DIAGNOSIS — L400 Psoriasis vulgaris: Secondary | ICD-10-CM | POA: Diagnosis not present

## 2021-10-05 DIAGNOSIS — M1380 Other specified arthritis, unspecified site: Secondary | ICD-10-CM | POA: Diagnosis not present

## 2021-10-05 DIAGNOSIS — D485 Neoplasm of uncertain behavior of skin: Secondary | ICD-10-CM | POA: Diagnosis not present

## 2021-10-16 DIAGNOSIS — E1169 Type 2 diabetes mellitus with other specified complication: Secondary | ICD-10-CM | POA: Diagnosis not present

## 2021-10-16 DIAGNOSIS — Z79899 Other long term (current) drug therapy: Secondary | ICD-10-CM | POA: Diagnosis not present

## 2021-10-16 DIAGNOSIS — E782 Mixed hyperlipidemia: Secondary | ICD-10-CM | POA: Diagnosis not present

## 2021-10-19 DIAGNOSIS — E782 Mixed hyperlipidemia: Secondary | ICD-10-CM | POA: Diagnosis not present

## 2021-10-19 DIAGNOSIS — I10 Essential (primary) hypertension: Secondary | ICD-10-CM | POA: Diagnosis not present

## 2021-10-19 DIAGNOSIS — Z23 Encounter for immunization: Secondary | ICD-10-CM | POA: Diagnosis not present

## 2021-10-19 DIAGNOSIS — K219 Gastro-esophageal reflux disease without esophagitis: Secondary | ICD-10-CM | POA: Diagnosis not present

## 2021-10-19 DIAGNOSIS — E785 Hyperlipidemia, unspecified: Secondary | ICD-10-CM | POA: Diagnosis not present

## 2021-10-19 DIAGNOSIS — E1169 Type 2 diabetes mellitus with other specified complication: Secondary | ICD-10-CM | POA: Diagnosis not present

## 2021-12-18 DIAGNOSIS — Z79899 Other long term (current) drug therapy: Secondary | ICD-10-CM | POA: Diagnosis not present

## 2022-02-08 DIAGNOSIS — L400 Psoriasis vulgaris: Secondary | ICD-10-CM | POA: Diagnosis not present

## 2022-02-08 DIAGNOSIS — Z85828 Personal history of other malignant neoplasm of skin: Secondary | ICD-10-CM | POA: Diagnosis not present

## 2022-02-08 DIAGNOSIS — L820 Inflamed seborrheic keratosis: Secondary | ICD-10-CM | POA: Diagnosis not present

## 2022-02-08 DIAGNOSIS — D485 Neoplasm of uncertain behavior of skin: Secondary | ICD-10-CM | POA: Diagnosis not present

## 2022-02-08 DIAGNOSIS — L821 Other seborrheic keratosis: Secondary | ICD-10-CM | POA: Diagnosis not present

## 2022-02-08 DIAGNOSIS — L570 Actinic keratosis: Secondary | ICD-10-CM | POA: Diagnosis not present

## 2022-02-08 DIAGNOSIS — D1801 Hemangioma of skin and subcutaneous tissue: Secondary | ICD-10-CM | POA: Diagnosis not present

## 2022-02-08 DIAGNOSIS — C44319 Basal cell carcinoma of skin of other parts of face: Secondary | ICD-10-CM | POA: Diagnosis not present

## 2022-02-16 ENCOUNTER — Other Ambulatory Visit: Payer: Self-pay | Admitting: *Deleted

## 2022-02-16 MED ORDER — ATORVASTATIN CALCIUM 80 MG PO TABS
80.0000 mg | ORAL_TABLET | Freq: Every day | ORAL | 2 refills | Status: DC
Start: 2022-02-16 — End: 2022-08-02

## 2022-02-22 DIAGNOSIS — L4050 Arthropathic psoriasis, unspecified: Secondary | ICD-10-CM | POA: Diagnosis not present

## 2022-02-22 DIAGNOSIS — M0600 Rheumatoid arthritis without rheumatoid factor, unspecified site: Secondary | ICD-10-CM | POA: Diagnosis not present

## 2022-02-22 DIAGNOSIS — M1990 Unspecified osteoarthritis, unspecified site: Secondary | ICD-10-CM | POA: Diagnosis not present

## 2022-02-22 DIAGNOSIS — M8580 Other specified disorders of bone density and structure, unspecified site: Secondary | ICD-10-CM | POA: Diagnosis not present

## 2022-02-22 DIAGNOSIS — Z79899 Other long term (current) drug therapy: Secondary | ICD-10-CM | POA: Diagnosis not present

## 2022-04-12 DIAGNOSIS — C44319 Basal cell carcinoma of skin of other parts of face: Secondary | ICD-10-CM | POA: Diagnosis not present

## 2022-04-23 DIAGNOSIS — Z79899 Other long term (current) drug therapy: Secondary | ICD-10-CM | POA: Diagnosis not present

## 2022-04-23 DIAGNOSIS — E1169 Type 2 diabetes mellitus with other specified complication: Secondary | ICD-10-CM | POA: Diagnosis not present

## 2022-04-23 DIAGNOSIS — E782 Mixed hyperlipidemia: Secondary | ICD-10-CM | POA: Diagnosis not present

## 2022-04-26 DIAGNOSIS — E782 Mixed hyperlipidemia: Secondary | ICD-10-CM | POA: Diagnosis not present

## 2022-04-26 DIAGNOSIS — K219 Gastro-esophageal reflux disease without esophagitis: Secondary | ICD-10-CM | POA: Diagnosis not present

## 2022-04-26 DIAGNOSIS — M0600 Rheumatoid arthritis without rheumatoid factor, unspecified site: Secondary | ICD-10-CM | POA: Diagnosis not present

## 2022-04-26 DIAGNOSIS — M810 Age-related osteoporosis without current pathological fracture: Secondary | ICD-10-CM | POA: Diagnosis not present

## 2022-04-26 DIAGNOSIS — Z139 Encounter for screening, unspecified: Secondary | ICD-10-CM | POA: Diagnosis not present

## 2022-04-26 DIAGNOSIS — I10 Essential (primary) hypertension: Secondary | ICD-10-CM | POA: Diagnosis not present

## 2022-04-26 DIAGNOSIS — E785 Hyperlipidemia, unspecified: Secondary | ICD-10-CM | POA: Diagnosis not present

## 2022-04-26 DIAGNOSIS — Z9181 History of falling: Secondary | ICD-10-CM | POA: Diagnosis not present

## 2022-04-26 DIAGNOSIS — E1169 Type 2 diabetes mellitus with other specified complication: Secondary | ICD-10-CM | POA: Diagnosis not present

## 2022-05-24 DIAGNOSIS — M1990 Unspecified osteoarthritis, unspecified site: Secondary | ICD-10-CM | POA: Diagnosis not present

## 2022-05-24 DIAGNOSIS — M8580 Other specified disorders of bone density and structure, unspecified site: Secondary | ICD-10-CM | POA: Diagnosis not present

## 2022-05-24 DIAGNOSIS — M0600 Rheumatoid arthritis without rheumatoid factor, unspecified site: Secondary | ICD-10-CM | POA: Diagnosis not present

## 2022-05-24 DIAGNOSIS — Z79899 Other long term (current) drug therapy: Secondary | ICD-10-CM | POA: Diagnosis not present

## 2022-05-24 DIAGNOSIS — L4050 Arthropathic psoriasis, unspecified: Secondary | ICD-10-CM | POA: Diagnosis not present

## 2022-05-31 DIAGNOSIS — Z1231 Encounter for screening mammogram for malignant neoplasm of breast: Secondary | ICD-10-CM | POA: Diagnosis not present

## 2022-07-04 ENCOUNTER — Other Ambulatory Visit: Payer: Self-pay | Admitting: Nurse Practitioner

## 2022-08-02 ENCOUNTER — Other Ambulatory Visit: Payer: Self-pay

## 2022-08-02 MED ORDER — ATORVASTATIN CALCIUM 80 MG PO TABS: 80.0000 mg | ORAL_TABLET | Freq: Every day | ORAL | 0 refills | Status: DC

## 2022-08-06 ENCOUNTER — Encounter: Payer: Self-pay | Admitting: Cardiology

## 2022-08-06 ENCOUNTER — Ambulatory Visit: Payer: Medicare Other | Attending: Cardiology | Admitting: Cardiology

## 2022-08-06 VITALS — BP 122/78 | HR 64 | Ht 59.0 in | Wt 146.6 lb

## 2022-08-06 DIAGNOSIS — E7800 Pure hypercholesterolemia, unspecified: Secondary | ICD-10-CM | POA: Diagnosis not present

## 2022-08-06 DIAGNOSIS — I10 Essential (primary) hypertension: Secondary | ICD-10-CM

## 2022-08-06 DIAGNOSIS — Z951 Presence of aortocoronary bypass graft: Secondary | ICD-10-CM

## 2022-08-06 DIAGNOSIS — Z952 Presence of prosthetic heart valve: Secondary | ICD-10-CM | POA: Diagnosis not present

## 2022-08-06 NOTE — Progress Notes (Signed)
Cardiology Office Note:    Date:  08/06/2022   ID:  Shelley Berg, DOB 11-Aug-1945, MRN [id number]  PCP:  Ailene Ravel, MD   [facility] Providers Cardiologist:  Debbe Odea, MD     Referring MD: Ailene Ravel, MD   Chief Complaint  Patient presents with   Follow-up    12 mo f/u. Patient feels well. Meds reviewed.    History of Present Illness:    Shelley Berg is a 77 y.o. female with a hx of CAD s/p CABG x 2 11/2020 (SVG-OM, SVG -PDA), severe AS s/p AVR bioprosthetic 11/2020, hypertension, hyperlipidemia presents for follow-up.    Patient states feeling great, denies chest pain or shortness of breath.  Recently lost her husband 2 months ago.  Otherwise doing okay.  Compliant with medications as prescribed.  No concerns at this time.  Prior notes LHC 08/2020 proximal LAD 60%, mid LAD 40%, mid RCA 100%, mid left circumflex 85%. Echo 05/2020 EF 70 to 75%, moderate aortic stenosis, impaired relaxation  family history of MI in her dad age 56s, 2 brothers had bypass surgeries around age 13s.   Past Medical History:  Diagnosis Date   Aortic stenosis    a. 05/2020 Echo: EF 70-75%, GrI DD, mod-sev AS; b. 08/2020 Cath: AS peak grad w/ AvA 1.00 cm^2; c. 11/2020 SAVR: s/p 21mm Edwards INSPIRIS RESILIA pericardial valve.   Asthma    as a child, no problems as an adult, no inhalers   CAD (coronary artery disease)    a. 08/2020 Cath: LM nl, LAD 60p, 21m, LCX 30ost/p, 66m, RCA 158m w/ L->R collats filling RPDA from LAD; b. 11/2020 CABG x 2 (w/ AVR): VG->OM, VG->RPDA.   Dyspnea    with exertion   GERD (gastroesophageal reflux disease)    Heart murmur    Hyperlipidemia    Hypertension    Obesity    Pre-diabetes    Rheumatoid arthritis (HCC)    Seasonal allergies     Past Surgical History:  Procedure Laterality Date   AORTIC VALVE REPLACEMENT N/A 11/11/2020   Procedure: AORTIC VALVE REPLACEMENT (AVR) WITH INSPIRIS RESILIA AORTIC VALVE SIZE ;  Surgeon:  Alleen Borne, MD;  [facility];  Service: Open Heart Surgery;  Laterality: N/A;   CORONARY ARTERY BYPASS GRAFT N/A 11/11/2020   Procedure: CORONARY ARTERY BYPASS GRAFTING (CABG) X  2 ON PUMP USING RIGHT ENDOSCOPIC GREATER SAPHENOUS VEIN CONDUITS;  Surgeon: Alleen Borne, MD;  [facility];  Service: Open Heart Surgery;  Laterality: N/A;   ENDOVEIN HARVEST OF GREATER SAPHENOUS VEIN Right 11/11/2020   Procedure: ENDOVEIN HARVEST OF GREATER SAPHENOUS VEIN;  Surgeon: Alleen Borne, MD;  [facility];  Service: Open Heart Surgery;  Laterality: Right;   RIGHT/LEFT HEART CATH AND CORONARY ANGIOGRAPHY N/A 08/10/2020   Procedure: RIGHT/LEFT HEART CATH AND CORONARY ANGIOGRAPHY;  Surgeon: Iran Ouch, MD;  [facility];  Service: Cardiovascular;  Laterality: N/A;   TEE WITHOUT CARDIOVERSION N/A 11/11/2020   Procedure: TRANSESOPHAGEAL ECHOCARDIOGRAM (TEE);  Surgeon: Alleen Borne, MD;  [facility]: [facility];  Service: Open Heart Surgery;  Laterality: N/A;    Current Medications: Current Meds  Medication Sig   acetaminophen (TYLENOL) 500 MG tablet Take 1,000 mg by mouth every 6 (six) hours as needed for moderate pain.   alendronate (FOSAMAX) 70 MG tablet 1 TABLET 30 MINUTES BEFORE THE FIRST FOOD, BEVERAGE OR MEDICINE OF THE DAY WITH PLAIN WATER ORALLY ONCE  A WEEK 90 DAYS   aspirin 81 MG EC tablet Take 1 tablet (81 mg total) by mouth daily.   atorvastatin (LIPITOR) 80 MG tablet Take 1 tablet (80 mg total) by mouth daily.   Calcium Carb-Cholecalciferol (CALCIUM 600+D3 PO) Take 1 tablet by mouth in the morning.   Cholecalciferol (VITAMIN D3) 125 MCG (5000 UT) TABS Take 5,000 Units by mouth.   ezetimibe (ZETIA) 10 MG tablet TAKE 1 TABLET BY MOUTH DAILY   folic acid (FOLVITE) 1 MG tablet Take 1 mg by mouth in the morning.   hydroxychloroquine (PLAQUENIL) 200 MG tablet Take 400 mg by mouth in the morning.   loratadine (CLARITIN) 10 MG tablet Take 10 mg by mouth in the morning.    methotrexate (RHEUMATREX) 2.5 MG tablet Take 2.5 mg by mouth once a week.   metoprolol tartrate (LOPRESSOR) 25 MG tablet TAKE ONE-HALF TABLET BY MOUTH  TWICE DAILY   Omega-3 1000 MG CAPS Take 1,000 mg by mouth in the morning.   omeprazole (PRILOSEC) 20 MG capsule Take 20 mg by mouth in the morning.   OTEZLA 30 MG TABS Take 1 tablet by mouth 2 (two) times daily.     Allergies:   Penicillin g, Penicillin g sodium, and Penicillins   Social History   Socioeconomic History   Marital status: Married    Spouse name: Not on file   Number of children: Not on file   Years of education: Not on file   Highest education level: Not on file  Occupational History   Not on file  Tobacco Use   Smoking status: Never    Passive exposure: Never   Smokeless tobacco: Never  Vaping Use   Vaping status: Never Used  Substance and Sexual Activity   Alcohol use: Never   Drug use: Never   Sexual activity: Not on file  Other Topics Concern   Not on file  Social History Narrative   Not on file   Social Determinants of Health   Financial Resource Strain: Not on file  Food Insecurity: Not on file  Transportation Needs: Not on file  Physical Activity: Not on file  Stress: Not on file  Social Connections: Not on file     Family History: The patient's family history includes Heart attack in her father; Stroke in her father and mother.  ROS:   Please see the history of present illness.     All other systems reviewed and are negative.  EKGs/Labs/Other Studies Reviewed:    The following studies were reviewed today:   EKG Interpretation Date/Time:  Monday August 06 2022 10:25:39 EDT Ventricular Rate:  64 PR Interval:  142 QRS Duration:  88 QT Interval:  440 QTC Calculation: 453 R Axis:   21  Text Interpretation: Normal sinus rhythm Inferior infarct Confirmed by Debbe Odea ([id number]) on 08/06/2022 10:30:02 AM    Recent Labs: No results found for requested labs within last 365 days.   Recent Lipid Panel    Component Value Date/Time   CHOL 163 08/16/2020 0728   TRIG 128 08/16/2020 0728   HDL 60 08/16/2020 0728   CHOLHDL 2.7 08/16/2020 0728   VLDL 26 08/16/2020 0728   LDLCALC 77 08/16/2020 0728     Risk Assessment/Calculations:        Physical Exam:    VS:  BP 122/78 (BP Location: Left Arm, Patient Position: Sitting, Cuff Size: Normal)   Pulse 64   Ht 4\' 11"  (1.499 m)   Wt 146 lb 9.6  oz (66.5 kg)   SpO2 98%   BMI 29.61 kg/m     Wt Readings from Last 3 Encounters:  08/06/22 146 lb 9.6 oz (66.5 kg)  08/03/21 145 lb 6 oz (65.9 kg)  02/03/21 149 lb (67.6 kg)     GEN:  Well nourished, well developed in no acute distress HEENT: Normal NECK: No JVD; No carotid bruits CARDIAC: RRR, no murmurs RESPIRATORY:  Clear to auscultation without rales, wheezing or rhonchi  ABDOMEN: Soft, non-tender, non-distended MUSCULOSKELETAL:  No edema; No deformity  SKIN: Warm and dry NEUROLOGIC:  Alert and oriented x 3 PSYCHIATRIC:  Normal affect   ASSESSMENT:    1. Hx of CABG   2. S/P AVR (aortic valve replacement)   3. Primary hypertension   4. Pure hypercholesterolemia    PLAN:    In order of problems listed above:  CAD s/p CABG x2 in 11/22 (SVG-OM, SVG-PDA). LAD 60%.  Denies chest pain.  Continue aspirin to 81 mg,  Lipitor 80 mg daily.  Aortic valve stenosis s/p AVR -bioprosthetic (11/22). last Echo 12/2020 with normal functioning prosthesis.  Plan repeat echo. Hypertension, BP controlled.  Continue Lopressor 12.5 mg twice daily Hyperlipidemia, cholesterol controlled, continue Lipitor 80 mg daily.  Follow-up yearly.   Medication Adjustments/Labs and Tests Ordered: Current medicines are reviewed at length with the patient today.  Concerns regarding medicines are outlined above.  Orders Placed This Encounter  Procedures   EKG 12-Lead   ECHOCARDIOGRAM COMPLETE     No orders of the defined types were placed in this encounter.     Patient Instructions   Medication Instructions:   Your physician recommends that you continue on your current medications as directed. Please refer to the Current Medication list given to you today.  *If you need a refill on your cardiac medications before your next appointment, please call your pharmacy*   Lab Work:  None Ordered  If you have labs (blood work) drawn today and your tests are completely normal, you will receive your results only by: MyChart Message (if you have MyChart) OR A paper copy in the mail If you have any lab test that is abnormal or we need to change your treatment, we will call you to review the results.   Testing/Procedures:  Your physician has requested that you have an echocardiogram in 2 months. Echocardiography is a painless test that uses sound waves to create images of your heart. It provides your doctor with information about the size and shape of your heart and how well your heart's chambers and valves are working. This procedure takes approximately one hour. There are no restrictions for this procedure. Please do NOT wear cologne, perfume, aftershave, or lotions (deodorant is allowed). Please arrive 15 minutes prior to your appointment time.    Follow-Up: At [facility], you and your health needs are our priority.  As part of our continuing mission to provide you with exceptional heart care, we have created designated Provider Care Teams.  These Care Teams include your primary Cardiologist (physician) and Advanced Practice Providers (APPs -  Physician Assistants and Nurse Practitioners) who all work together to provide you with the care you need, when you need it.  We recommend signing up for the patient portal called "MyChart".  Sign up information is provided on this After Visit Summary.  MyChart is used to connect with patients for Virtual Visits (Telemedicine).  Patients are able to view lab/test results, encounter notes, upcoming appointments, etc.   Non-urgent  messages can be sent to your provider as well.   To learn more about what you can do with MyChart, go to [email or web address].    Your next appointment:   12 month(s)  Provider:   You may see Debbe Odea, MD or one of the following Advanced Practice Providers on your designated Care Team:   Nicolasa Ducking, NP Eula Listen, PA-C Cadence Fransico Michael, PA-C Charlsie Quest, NP    Signed, Debbe Odea, MD  08/06/2022 11:00 AM    [facility]

## 2022-08-06 NOTE — Patient Instructions (Signed)
Medication Instructions:   Your physician recommends that you continue on your current medications as directed. Please refer to the Current Medication list given to you today.  *If you need a refill on your cardiac medications before your next appointment, please call your pharmacy*   Lab Work:  None Ordered  If you have labs (blood work) drawn today and your tests are completely normal, you will receive your results only by: MyChart Message (if you have MyChart) OR A paper copy in the mail If you have any lab test that is abnormal or we need to change your treatment, we will call you to review the results.   Testing/Procedures:  Your physician has requested that you have an echocardiogram in 2 months. Echocardiography is a painless test that uses sound waves to create images of your heart. It provides your doctor with information about the size and shape of your heart and how well your heart's chambers and valves are working. This procedure takes approximately one hour. There are no restrictions for this procedure. Please do NOT wear cologne, perfume, aftershave, or lotions (deodorant is allowed). Please arrive 15 minutes prior to your appointment time.    Follow-Up: At [facility], you and your health needs are our priority.  As part of our continuing mission to provide you with exceptional heart care, we have created designated Provider Care Teams.  These Care Teams include your primary Cardiologist (physician) and Advanced Practice Providers (APPs -  Physician Assistants and Nurse Practitioners) who all work together to provide you with the care you need, when you need it.  We recommend signing up for the patient portal called "MyChart".  Sign up information is provided on this After Visit Summary.  MyChart is used to connect with patients for Virtual Visits (Telemedicine).  Patients are able to view lab/test results, encounter notes, upcoming appointments, etc.  Non-urgent  messages can be sent to your provider as well.   To learn more about what you can do with MyChart, go to [email or web address].    Your next appointment:   12 month(s)  Provider:   You may see Debbe Odea, MD or one of the following Advanced Practice Providers on your designated Care Team:   Nicolasa Ducking, NP Eula Listen, PA-C Cadence Fransico Michael, PA-C Charlsie Quest, NP

## 2022-09-11 ENCOUNTER — Other Ambulatory Visit: Payer: Self-pay

## 2022-09-11 MED ORDER — EZETIMIBE 10 MG PO TABS: 10.0000 mg | ORAL_TABLET | Freq: Every day | ORAL | 3 refills | Status: DC

## 2022-09-11 MED ORDER — METOPROLOL TARTRATE 25 MG PO TABS: 12.5000 mg | ORAL_TABLET | Freq: Two times a day (BID) | ORAL | 3 refills | Status: DC

## 2022-09-27 DIAGNOSIS — M0600 Rheumatoid arthritis without rheumatoid factor, unspecified site: Secondary | ICD-10-CM | POA: Diagnosis not present

## 2022-09-27 DIAGNOSIS — Z79899 Other long term (current) drug therapy: Secondary | ICD-10-CM | POA: Diagnosis not present

## 2022-09-27 DIAGNOSIS — M8580 Other specified disorders of bone density and structure, unspecified site: Secondary | ICD-10-CM | POA: Diagnosis not present

## 2022-09-27 DIAGNOSIS — M1990 Unspecified osteoarthritis, unspecified site: Secondary | ICD-10-CM | POA: Diagnosis not present

## 2022-09-27 DIAGNOSIS — L4050 Arthropathic psoriasis, unspecified: Secondary | ICD-10-CM | POA: Diagnosis not present

## 2022-10-01 ENCOUNTER — Other Ambulatory Visit: Payer: Self-pay

## 2022-10-01 MED ORDER — ATORVASTATIN CALCIUM 80 MG PO TABS: 80.0000 mg | ORAL_TABLET | Freq: Every day | ORAL | 2 refills | Status: DC

## 2022-10-01 NOTE — Telephone Encounter (Signed)
Requested Prescriptions   Signed Prescriptions Disp Refills   atorvastatin (LIPITOR) 80 MG tablet 90 tablet 2    Sig: Take 1 tablet (80 mg total) by mouth daily.    Authorizing Provider: Debbe Odea    Ordering User: Guerry Minors

## 2022-10-08 ENCOUNTER — Ambulatory Visit: Payer: Medicare Other | Attending: Cardiology

## 2022-10-08 DIAGNOSIS — Z952 Presence of prosthetic heart valve: Secondary | ICD-10-CM | POA: Diagnosis not present

## 2022-10-08 DIAGNOSIS — Z951 Presence of aortocoronary bypass graft: Secondary | ICD-10-CM

## 2022-10-09 LAB — ECHOCARDIOGRAM COMPLETE
AV Mean grad: 7.5 mm[Hg]
AV Peak grad: 14.1 mm[Hg]
Ao pk vel: 1.88 m/s
Area-P 1/2: 2.76 cm2
S' Lateral: 2.4 cm

## 2022-10-29 DIAGNOSIS — E782 Mixed hyperlipidemia: Secondary | ICD-10-CM | POA: Diagnosis not present

## 2022-10-29 DIAGNOSIS — Z79899 Other long term (current) drug therapy: Secondary | ICD-10-CM | POA: Diagnosis not present

## 2022-10-29 DIAGNOSIS — E1169 Type 2 diabetes mellitus with other specified complication: Secondary | ICD-10-CM | POA: Diagnosis not present

## 2022-11-01 DIAGNOSIS — Z23 Encounter for immunization: Secondary | ICD-10-CM | POA: Diagnosis not present

## 2022-11-01 DIAGNOSIS — E785 Hyperlipidemia, unspecified: Secondary | ICD-10-CM | POA: Diagnosis not present

## 2022-11-01 DIAGNOSIS — M810 Age-related osteoporosis without current pathological fracture: Secondary | ICD-10-CM | POA: Diagnosis not present

## 2022-11-01 DIAGNOSIS — E1169 Type 2 diabetes mellitus with other specified complication: Secondary | ICD-10-CM | POA: Diagnosis not present

## 2022-11-01 DIAGNOSIS — I10 Essential (primary) hypertension: Secondary | ICD-10-CM | POA: Diagnosis not present

## 2022-11-01 DIAGNOSIS — K219 Gastro-esophageal reflux disease without esophagitis: Secondary | ICD-10-CM | POA: Diagnosis not present

## 2022-11-01 DIAGNOSIS — E782 Mixed hyperlipidemia: Secondary | ICD-10-CM | POA: Diagnosis not present

## 2022-11-01 DIAGNOSIS — M0600 Rheumatoid arthritis without rheumatoid factor, unspecified site: Secondary | ICD-10-CM | POA: Diagnosis not present

## 2022-11-02 DIAGNOSIS — Z23 Encounter for immunization: Secondary | ICD-10-CM | POA: Diagnosis not present

## 2022-11-06 DIAGNOSIS — Z9181 History of falling: Secondary | ICD-10-CM | POA: Diagnosis not present

## 2022-11-06 DIAGNOSIS — Z139 Encounter for screening, unspecified: Secondary | ICD-10-CM | POA: Diagnosis not present

## 2022-11-06 DIAGNOSIS — Z0000 Encounter for general adult medical examination without abnormal findings: Secondary | ICD-10-CM | POA: Diagnosis not present

## 2022-11-09 DIAGNOSIS — J019 Acute sinusitis, unspecified: Secondary | ICD-10-CM | POA: Diagnosis not present

## 2022-11-09 DIAGNOSIS — J209 Acute bronchitis, unspecified: Secondary | ICD-10-CM | POA: Diagnosis not present

## 2022-11-20 DIAGNOSIS — Z79899 Other long term (current) drug therapy: Secondary | ICD-10-CM | POA: Diagnosis not present

## 2022-11-20 DIAGNOSIS — H2513 Age-related nuclear cataract, bilateral: Secondary | ICD-10-CM | POA: Diagnosis not present

## 2022-11-20 DIAGNOSIS — M1380 Other specified arthritis, unspecified site: Secondary | ICD-10-CM | POA: Diagnosis not present

## 2022-11-20 DIAGNOSIS — E119 Type 2 diabetes mellitus without complications: Secondary | ICD-10-CM | POA: Diagnosis not present

## 2023-01-29 DIAGNOSIS — M8580 Other specified disorders of bone density and structure, unspecified site: Secondary | ICD-10-CM | POA: Diagnosis not present

## 2023-01-29 DIAGNOSIS — M79641 Pain in right hand: Secondary | ICD-10-CM | POA: Diagnosis not present

## 2023-01-29 DIAGNOSIS — M0600 Rheumatoid arthritis without rheumatoid factor, unspecified site: Secondary | ICD-10-CM | POA: Diagnosis not present

## 2023-01-29 DIAGNOSIS — L4050 Arthropathic psoriasis, unspecified: Secondary | ICD-10-CM | POA: Diagnosis not present

## 2023-01-29 DIAGNOSIS — M25551 Pain in right hip: Secondary | ICD-10-CM | POA: Diagnosis not present

## 2023-01-29 DIAGNOSIS — Z79899 Other long term (current) drug therapy: Secondary | ICD-10-CM | POA: Diagnosis not present

## 2023-01-29 DIAGNOSIS — M79642 Pain in left hand: Secondary | ICD-10-CM | POA: Diagnosis not present

## 2023-01-29 DIAGNOSIS — M1990 Unspecified osteoarthritis, unspecified site: Secondary | ICD-10-CM | POA: Diagnosis not present

## 2023-01-29 DIAGNOSIS — M25552 Pain in left hip: Secondary | ICD-10-CM | POA: Diagnosis not present

## 2023-02-27 ENCOUNTER — Other Ambulatory Visit: Payer: Self-pay

## 2023-02-27 MED ORDER — ATORVASTATIN CALCIUM 80 MG PO TABS: 80.0000 mg | ORAL_TABLET | Freq: Every day | ORAL | 3 refills | Status: DC

## 2023-03-28 DIAGNOSIS — C44722 Squamous cell carcinoma of skin of right lower limb, including hip: Secondary | ICD-10-CM | POA: Diagnosis not present

## 2023-03-28 DIAGNOSIS — L814 Other melanin hyperpigmentation: Secondary | ICD-10-CM | POA: Diagnosis not present

## 2023-03-28 DIAGNOSIS — L821 Other seborrheic keratosis: Secondary | ICD-10-CM | POA: Diagnosis not present

## 2023-03-28 DIAGNOSIS — C44519 Basal cell carcinoma of skin of other part of trunk: Secondary | ICD-10-CM | POA: Diagnosis not present

## 2023-03-28 DIAGNOSIS — L820 Inflamed seborrheic keratosis: Secondary | ICD-10-CM | POA: Diagnosis not present

## 2023-03-28 DIAGNOSIS — L2089 Other atopic dermatitis: Secondary | ICD-10-CM | POA: Diagnosis not present

## 2023-03-28 DIAGNOSIS — Z85828 Personal history of other malignant neoplasm of skin: Secondary | ICD-10-CM | POA: Diagnosis not present

## 2023-03-28 DIAGNOSIS — L570 Actinic keratosis: Secondary | ICD-10-CM | POA: Diagnosis not present

## 2023-04-30 DIAGNOSIS — H2513 Age-related nuclear cataract, bilateral: Secondary | ICD-10-CM | POA: Diagnosis not present

## 2023-04-30 DIAGNOSIS — H35723 Serous detachment of retinal pigment epithelium, bilateral: Secondary | ICD-10-CM | POA: Diagnosis not present

## 2023-05-02 DIAGNOSIS — Z79899 Other long term (current) drug therapy: Secondary | ICD-10-CM | POA: Diagnosis not present

## 2023-05-02 DIAGNOSIS — E782 Mixed hyperlipidemia: Secondary | ICD-10-CM | POA: Diagnosis not present

## 2023-05-02 DIAGNOSIS — Z1159 Encounter for screening for other viral diseases: Secondary | ICD-10-CM | POA: Diagnosis not present

## 2023-05-02 DIAGNOSIS — E1169 Type 2 diabetes mellitus with other specified complication: Secondary | ICD-10-CM | POA: Diagnosis not present

## 2023-05-03 ENCOUNTER — Observation Stay (HOSPITAL_COMMUNITY)
Admission: EM | Admit: 2023-05-03 | Discharge: 2023-05-05 | Disposition: A | Discharge status: Home / Self Care | Attending: Internal Medicine | Admitting: Internal Medicine

## 2023-05-03 ENCOUNTER — Other Ambulatory Visit: Payer: Self-pay

## 2023-05-03 DIAGNOSIS — Z79899 Other long term (current) drug therapy: Secondary | ICD-10-CM | POA: Diagnosis not present

## 2023-05-03 DIAGNOSIS — K449 Diaphragmatic hernia without obstruction or gangrene: Secondary | ICD-10-CM | POA: Insufficient documentation

## 2023-05-03 DIAGNOSIS — K3189 Other diseases of stomach and duodenum: Secondary | ICD-10-CM | POA: Diagnosis not present

## 2023-05-03 DIAGNOSIS — Z951 Presence of aortocoronary bypass graft: Secondary | ICD-10-CM | POA: Diagnosis not present

## 2023-05-03 DIAGNOSIS — I10 Essential (primary) hypertension: Secondary | ICD-10-CM | POA: Insufficient documentation

## 2023-05-03 DIAGNOSIS — D649 Anemia, unspecified: Secondary | ICD-10-CM | POA: Diagnosis not present

## 2023-05-03 DIAGNOSIS — D509 Iron deficiency anemia, unspecified: Principal | ICD-10-CM

## 2023-05-03 DIAGNOSIS — R195 Other fecal abnormalities: Secondary | ICD-10-CM

## 2023-05-03 DIAGNOSIS — Z7982 Long term (current) use of aspirin: Secondary | ICD-10-CM | POA: Insufficient documentation

## 2023-05-03 DIAGNOSIS — D122 Benign neoplasm of ascending colon: Secondary | ICD-10-CM | POA: Diagnosis not present

## 2023-05-03 DIAGNOSIS — K5730 Diverticulosis of large intestine without perforation or abscess without bleeding: Secondary | ICD-10-CM | POA: Insufficient documentation

## 2023-05-03 DIAGNOSIS — E119 Type 2 diabetes mellitus without complications: Secondary | ICD-10-CM | POA: Diagnosis not present

## 2023-05-03 DIAGNOSIS — I2510 Atherosclerotic heart disease of native coronary artery without angina pectoris: Secondary | ICD-10-CM | POA: Diagnosis not present

## 2023-05-03 DIAGNOSIS — D123 Benign neoplasm of transverse colon: Secondary | ICD-10-CM | POA: Diagnosis not present

## 2023-05-03 DIAGNOSIS — D125 Benign neoplasm of sigmoid colon: Secondary | ICD-10-CM

## 2023-05-03 DIAGNOSIS — Z7984 Long term (current) use of oral hypoglycemic drugs: Secondary | ICD-10-CM | POA: Diagnosis not present

## 2023-05-03 DIAGNOSIS — D120 Benign neoplasm of cecum: Secondary | ICD-10-CM | POA: Diagnosis not present

## 2023-05-03 DIAGNOSIS — K222 Esophageal obstruction: Secondary | ICD-10-CM | POA: Insufficient documentation

## 2023-05-03 DIAGNOSIS — R0602 Shortness of breath: Secondary | ICD-10-CM | POA: Diagnosis not present

## 2023-05-03 DIAGNOSIS — K921 Melena: Secondary | ICD-10-CM | POA: Diagnosis present

## 2023-05-03 LAB — COMPREHENSIVE METABOLIC PANEL WITH GFR
ALT: 28 U/L (ref 0–44)
AST: 26 U/L (ref 15–41)
Albumin: 3.2 g/dL — ABNORMAL LOW (ref 3.5–5.0)
Alkaline Phosphatase: 42 U/L (ref 38–126)
Anion gap: 10 (ref 5–15)
BUN: 8 mg/dL (ref 8–23)
CO2: 21 mmol/L — ABNORMAL LOW (ref 22–32)
Calcium: 9.2 mg/dL (ref 8.9–10.3)
Chloride: 109 mmol/L (ref 98–111)
Creatinine, Ser: 0.73 mg/dL (ref 0.44–1.00)
GFR, Estimated: >60 mL/min (ref >60)
Glucose, Bld: 187 mg/dL — ABNORMAL HIGH (ref 70–99)
Potassium: 4.4 mmol/L (ref 3.5–5.1)
Sodium: 140 mmol/L (ref 135–145)
Total Bilirubin: 0.5 mg/dL (ref 0.0–1.2)
Total Protein: 5.7 g/dL — ABNORMAL LOW (ref 6.5–8.1)

## 2023-05-03 LAB — CBC
HCT: 21.8 % — ABNORMAL LOW (ref 36.0–46.0)
Hemoglobin: 6.1 g/dL — CL (ref 12.0–15.0)
MCH: 20.7 pg — ABNORMAL LOW (ref 26.0–34.0)
MCHC: 28 g/dL — ABNORMAL LOW (ref 30.0–36.0)
MCV: 73.9 fL — ABNORMAL LOW (ref 80.0–100.0)
Platelets: 365 10*3/uL (ref 150–400)
RBC: 2.95 MIL/uL — ABNORMAL LOW (ref 3.87–5.11)
RDW: 19.2 % — ABNORMAL HIGH (ref 11.5–15.5)
WBC: 10 10*3/uL (ref 4.0–10.5)
nRBC: 0.4 % — ABNORMAL HIGH (ref 0.0–0.2)

## 2023-05-03 LAB — PREPARE RBC (CROSSMATCH)

## 2023-05-03 LAB — POC OCCULT BLOOD, ED: Fecal Occult Bld: POSITIVE — AB

## 2023-05-03 LAB — HEMOGLOBIN AND HEMATOCRIT, BLOOD
HCT: 24.2 % — ABNORMAL LOW (ref 36.0–46.0)
Hemoglobin: 7.3 g/dL — ABNORMAL LOW (ref 12.0–15.0)

## 2023-05-03 MED ORDER — METOPROLOL TARTRATE 12.5 MG HALF TABLET
12.5000 mg | ORAL_TABLET | Freq: Two times a day (BID) | ORAL | Status: DC
  Administered 2023-05-03 – 2023-05-05 (×4): 12.5 mg via ORAL
  Filled 2023-05-03 (×4): qty 1

## 2023-05-03 MED ORDER — FOLIC ACID 1 MG PO TABS
1.0000 mg | ORAL_TABLET | Freq: Every morning | ORAL | Status: DC
  Administered 2023-05-04 – 2023-05-05 (×2): 1 mg via ORAL
  Filled 2023-05-03 (×2): qty 1

## 2023-05-03 MED ORDER — ACETAMINOPHEN 325 MG PO TABS: 650.0000 mg | ORAL_TABLET | Freq: Four times a day (QID) | ORAL | Status: DC | PRN

## 2023-05-03 MED ORDER — APREMILAST 30 MG PO TABS
30.0000 mg | ORAL_TABLET | Freq: Two times a day (BID) | ORAL | Status: DC
  Administered 2023-05-03 – 2023-05-04 (×3): 30 mg via ORAL
  Filled 2023-05-03 (×5): qty 1

## 2023-05-03 MED ORDER — PANTOPRAZOLE SODIUM 40 MG IV SOLR
40.0000 mg | Freq: Two times a day (BID) | INTRAVENOUS | Status: DC
  Administered 2023-05-03 – 2023-05-04 (×3): 40 mg via INTRAVENOUS
  Filled 2023-05-03 (×3): qty 10

## 2023-05-03 MED ORDER — HYDROXYCHLOROQUINE SULFATE 200 MG PO TABS
400.0000 mg | ORAL_TABLET | Freq: Every morning | ORAL | Status: DC
  Administered 2023-05-04 – 2023-05-05 (×2): 400 mg via ORAL
  Filled 2023-05-03 (×3): qty 2

## 2023-05-03 MED ORDER — EZETIMIBE 10 MG PO TABS
10.0000 mg | ORAL_TABLET | Freq: Every day | ORAL | Status: DC
  Administered 2023-05-04 – 2023-05-05 (×2): 10 mg via ORAL
  Filled 2023-05-03 (×2): qty 1

## 2023-05-03 MED ORDER — ACETAMINOPHEN 650 MG RE SUPP: 650.0000 mg | Freq: Four times a day (QID) | RECTAL | Status: DC | PRN

## 2023-05-03 MED ORDER — ATORVASTATIN CALCIUM 80 MG PO TABS
80.0000 mg | ORAL_TABLET | Freq: Every day | ORAL | Status: DC
  Administered 2023-05-04 – 2023-05-05 (×2): 80 mg via ORAL
  Filled 2023-05-03 (×2): qty 1

## 2023-05-03 MED ORDER — SODIUM CHLORIDE 0.9% IV SOLUTION: Freq: Once | INTRAVENOUS | Status: AC

## 2023-05-03 NOTE — H&P (Signed)
 Date: 05/03/2023               Patient Name:  Shelley Berg MRN: [id number]  DOB: 1945-07-28 Age / Sex: 78 y.o., female   PCP: Annette Barters, MD         Medical Service: Internal Medicine Teaching Service         Attending Physician: Dr. Priscella Brooms, DO      First Contact: Dr. Jose Ngo, MD     Second Contact: Dr. Lorelle Roll, DO         After Hours (After 5p/  First Contact Pager: [phone]  weekends / holidays): Second Contact Pager: [phone]   SUBJECTIVE   Chief Complaint: abnormal labs   History of Present Illness: Shelley Berg is a 78 yo female with HTN, HLD, GERD, rheumatoid arthritis, prediabetes, CAD s/p CABG and aortic stenosis s/p valve repair in 2022 who presents with abnormal blood work.  The patient went to her PCP yesterday because she had been feeling fatigued and short of breath for the last week or two. She attributed her SOB to her recent travels/walking around [place] . She was there on a trip for vacation and noticed her SOB worsened with exertion. However, her PCP called her today telling her to go to the ED due to low Hgb of 6.4. The patient denies any hematochezia, but does endorse melena intermittently. Her last episode of melena was about one week ago, although her last bowel movement was this morning and it was normal. She thought the dark stools were normal and were related to what she ate. She denies any fevers, chills, chest pain, abdominal pain, n/v/d, dysuria, hematuria. The patient has always had a history of positional vertigo and feels that that is stable - she also denies any falls or syncope.   She denies any history of GI bleeds in the past. She does not take NSAIDs and has not taken any new medications. The patient does not drink alcohol.  Her last cologuard was about two years ago and was normal.   ED Course:  BP stable WNL at 137/73. Saturating well at RA. Afebrile, mildly tachypneic (16-21).  Stool was brown but  hemoccult positive. No associated abdominal pain. She does have a murmur present and mild bilateral edema with lungs clear to auscultation. IMTS was paged for admission.    Meds:  Alendronate 70 mg once a week Aspirin  81 mg daily Lipitor  80 mg daily Calcium  Vitamin D3 Zetia  10 mg daily Folic acid  Hydroxychloroquine  400 mg at bedtime Claritin  Methotrexate 2.5 mg once a week Metoprolol  12.5 mg BID Metformin 500 mg bid Omeprazole 20 mg daily Otezla /Apremilast  30 mg BID  Current Meds  Medication Sig   acetaminophen  (TYLENOL ) 500 MG tablet Take 1,000 mg by mouth every 6 (six) hours as needed for moderate pain.   alendronate (FOSAMAX) 70 MG tablet Take 70 mg by mouth once a week. Monday   aspirin  81 MG EC tablet Take 1 tablet (81 mg total) by mouth daily.   atorvastatin  (LIPITOR ) 80 MG tablet Take 1 tablet (80 mg total) by mouth daily.   Calcium  Carb-Cholecalciferol (CALCIUM  600+D3 PO) Take 1 tablet by mouth in the morning.   Cholecalciferol (VITAMIN D3) 125 MCG (5000 UT) TABS Take 5,000 Units by mouth.   ezetimibe  (ZETIA ) 10 MG tablet Take 1 tablet (10 mg total) by mouth daily.   folic acid  (FOLVITE ) 1 MG tablet Take 1 mg by mouth in the morning.  hydroxychloroquine  (PLAQUENIL ) 200 MG tablet Take 400 mg by mouth in the morning.   loratadine  (CLARITIN ) 10 MG tablet Take 10 mg by mouth in the morning.   metFORMIN (GLUCOPHAGE-XR) 500 MG 24 hr tablet Take 500 mg by mouth daily with breakfast.   methotrexate (RHEUMATREX) 2.5 MG tablet Take 10 mg by mouth once a week. Mondays   metoprolol  tartrate (LOPRESSOR ) 25 MG tablet Take 0.5 tablets (12.5 mg total) by mouth 2 (two) times daily.   Omega-3 1000 MG CAPS Take 1,000 mg by mouth in the morning.   omeprazole (PRILOSEC) 20 MG capsule Take 20 mg by mouth in the morning.   OTEZLA  30 MG TABS Take 30 mg by mouth 2 (two) times daily.    Past Medical History  Past Surgical History:  Procedure Laterality Date   AORTIC VALVE REPLACEMENT N/A  11/11/2020   Procedure: AORTIC VALVE REPLACEMENT (AVR) WITH INSPIRIS RESILIA AORTIC VALVE SIZE ;  Surgeon: Bartley Lightning, MD;  Location: [facility] OR;  Service: Open Heart Surgery;  Laterality: N/A;   CORONARY ARTERY BYPASS GRAFT N/A 11/11/2020   Procedure: CORONARY ARTERY BYPASS GRAFTING (CABG) X  2 ON PUMP USING RIGHT ENDOSCOPIC GREATER SAPHENOUS VEIN CONDUITS;  Surgeon: Bartley Lightning, MD;  Location: [facility] OR;  Service: Open Heart Surgery;  Laterality: N/A;   ENDOVEIN HARVEST OF GREATER SAPHENOUS VEIN Right 11/11/2020   Procedure: ENDOVEIN HARVEST OF GREATER SAPHENOUS VEIN;  Surgeon: Bartley Lightning, MD;  Location: [facility] OR;  Service: Open Heart Surgery;  Laterality: Right;   RIGHT/LEFT HEART CATH AND CORONARY ANGIOGRAPHY N/A 08/10/2020   Procedure: RIGHT/LEFT HEART CATH AND CORONARY ANGIOGRAPHY;  Surgeon: Wenona Hamilton, MD;  Location: [facility] INVASIVE CV LAB;  Service: Cardiovascular;  Laterality: N/A;   TEE WITHOUT CARDIOVERSION N/A 11/11/2020   Procedure: TRANSESOPHAGEAL ECHOCARDIOGRAM (TEE);  Surgeon: Bartley Lightning, MD;  Location: [facility] OR;  Service: Open Heart Surgery;  Laterality: N/A;    Social:  Lives With: Alone Occupation: Works as a Conservation officer, nature at [employer]  Support: Daughter Level of Function: independent of ADLs, iADLs PCP: Hamrick, Orest Bio, MD [facility]) Substances: No alcohol. No tobacco use and no illicit substance use.   Family History: reviewed  Allergies: Allergies as of 05/03/2023 - Review Complete 05/03/2023  Allergen Reaction Noted   Penicillin g Other (See Comments) 06/01/2021   Penicillin g sodium Other (See Comments) 06/01/2021   Penicillins Rash 07/05/2020    Review of Systems: A complete ROS was negative except as per HPI.   OBJECTIVE:   Physical Exam: Blood pressure 137/73, pulse 90, temperature 98.3 F (36.8 C), resp. rate 16, SpO2 100%.  Constitutional: well-appearing elderly female sitting in bed, in no acute distress Cardiovascular: regular rate  and rhythm, systolic murmur appreciated Pulmonary/Chest: normal work of breathing on room air, lungs clear to auscultation bilaterally Abdominal: soft, non-tender, non-distended MSK: normal bulk and tone, slight swelling of RLE compared to LLE.  Neurological: alert & oriented x 3, no focal deficits Skin: erythematous ulcers on her bilateral extremities with hemorrhagic crust.  Psych: normal mood and affect  Labs: CBC    Component Value Date/Time   WBC 10.0 05/03/2023 1505   RBC 2.95 (L) 05/03/2023 1505   HGB 6.1 (LL) 05/03/2023 1505   HGB 12.4 12/07/2020 1011   HCT 21.8 (L) 05/03/2023 1505   HCT 36.3 12/07/2020 1011   PLT 365 05/03/2023 1505   PLT 390 12/07/2020 1011   MCV 73.9 (L) 05/03/2023 1505   MCV 89 12/07/2020  1011   MCH 20.7 (L) 05/03/2023 1505   MCHC 28.0 (L) 05/03/2023 1505   RDW 19.2 (H) 05/03/2023 1505   RDW 12.4 12/07/2020 1011   LYMPHSABS 2.3 12/07/2020 1011   EOSABS 0.6 (H) 12/07/2020 1011   BASOSABS 0.1 12/07/2020 1011     CMP     Component Value Date/Time   NA 140 05/03/2023 1505   NA 142 08/08/2020 1025   K 4.4 05/03/2023 1505   CL 109 05/03/2023 1505   CO2 21 (L) 05/03/2023 1505   GLUCOSE 187 (H) 05/03/2023 1505   BUN 8 05/03/2023 1505   BUN 10 08/08/2020 1025   CREATININE 0.73 05/03/2023 1505   CALCIUM  9.2 05/03/2023 1505   PROT 5.7 (L) 05/03/2023 1505   ALBUMIN  3.2 (L) 05/03/2023 1505   AST 26 05/03/2023 1505   ALT 28 05/03/2023 1505   ALKPHOS 42 05/03/2023 1505   BILITOT 0.5 05/03/2023 1505   GFRNONAA >60 05/03/2023 1505    Imaging:  EKG: NSR  ASSESSMENT & PLAN:   Assessment & Plan by Problem: Principal Problem:   Symptomatic anemia  Haylie B Suastegui is a 78 yo female with HTN, HLD, GERD, rheumatoid arthritis, prediabetes, CAD s/p CABG and aortic stenosis s/p valve repair in 2022 who presents with a hemoglobin of 6.1 and intermittent melena, and worsening SOB and fatigue admitted for symptomatic anemia.   Symptomatic  Anemia Patient reports intermittent melena that has been occurring for a long time.  She just thought that this was normal.  Her hemoglobin is 6.1 today after she wishes having a regular follow-up with her PCP.  She has been having shortness of breath and worsening fatigue for the last 1 or 2 weeks most notably when she recently went to [place] .  She denies any alcohol use, does drink a significant amount of tea, and has a history of GERD.  I suspect that she does have an upper GI bleed that is intermittent.  We will monitor for stability overnight.  Her abdominal exam is benign and DRE just showed brown stool.  She is hemodynamically stable at this time with a blood pressure within normal limits.  She denies having had a bleed in the past.  Recently got a Cologuard about 2 years ago that was normal.  Her MCV is decreased and RDW is increased she likely does have some iron deficiency as well.  She does not have any infectious symptoms.  White blood cell count is 10.  She has been afebrile and has not had any diarrhea.  Potassium is currently 4.4. Called GI- they will assess in the AM. She is on methotrexate with folic acid  supplementation for RA which may be contributing to anemia. T bili WNL.  -regular diet, NPO after midnight -Transfuse until Hgb >7  -post transfusion H/H  -Protonix  40mg  BID -orthostatics in the AM  -iron panel  Lower extremity edema R  Pitting edema on the right leg. Recent travel. No SOB, not tachycardic. Has hairless shiny legs with ulcerations which she attributes to recent 5-FU use  for Aks. Pulses weak bilaterally. Could be related to PAD. -US  venous doppler  CAD s/p CABG  Cw lipitor  80mg  daily amd zetia  10mg  daily. Will hold ASA 81mg  due to bleed.   T2DM  Takes metformin 500mg  BID, will hold. Hgb A1c was 6.2. Will monitor glucose in the AM tomorrow. Glucose is 187 tonight.  RA  Psoriasis  Hydroxychloroquine  400mg  at bedtime, methotrexate 2.5mg  once a week and folic  acid 1 mg daily. Otezla /Apremilast  30 mg BID. Will continue with same.   HTN  On metoprolol  12.5, will cw same given that BP is WNL.  -Monitor Vitals   Diet: Normal VTE: SCDs IVF: 1 pRBC Code: Full  Prior to Admission Living Arrangement: Home Anticipated Discharge Location: Home Barriers to Discharge: Medical work-up   Dispo: Admit patient to Observation with expected length of stay less than 2 midnights.  Signed: Jose Ngo, MD Internal Medicine Resident PGY-1 05/03/2023, 6:35 PM

## 2023-05-03 NOTE — ED Triage Notes (Signed)
 Patient arrives after being called by PCP today and told that her hgb drawn yesterday was 6.2. Denies obvious bleeding or complaints.

## 2023-05-03 NOTE — ED Notes (Signed)
 Lab called to report Hgb 6.1

## 2023-05-03 NOTE — ED Provider Notes (Addendum)
 [place] EMERGENCY DEPARTMENT AT [facility] Provider Note   CSN: [id number] Arrival date & time: 05/03/23  1422     History  Chief Complaint  Patient presents with   Abnormal Lab    Shelley Berg is a 78 y.o. female.  Patient is a 78 year old female with a history of hypertension, hyperlipidemia, rheumatoid arthritis, prediabetes, aortic stenosis status post valve repair in 2022 who presents with a low hemoglobin.  She was at her PCPs office yesterday and had blood work.  She was called today and advised to come to the emergency room due to low hemoglobin.  She has been feeling fatigued over the last week or so.  She has been having a little bit of shortness of breath on exertion but attributes the symptoms to walking around [place]  on one of her recent trips.  She has had some swelling in her legs over the last few weeks.  She denies any associated chest pain.  No fevers.  No cough or cold symptoms.  She does not report any melena or noticeable blood in her stool although she says occasionally they will be dark in color.  No history of GI bleeds in the past.       Home Medications Prior to Admission medications   Medication Sig Start Date End Date Taking? Authorizing Provider  acetaminophen  (TYLENOL ) 500 MG tablet Take 1,000 mg by mouth every 6 (six) hours as needed for moderate pain.    [provider]  alendronate (FOSAMAX) 70 MG tablet 1 TABLET 30 MINUTES BEFORE THE FIRST FOOD, BEVERAGE OR MEDICINE OF THE DAY WITH PLAIN WATER ORALLY ONCE A WEEK 90 DAYS 06/22/21   [provider]  aspirin  81 MG EC tablet Take 1 tablet (81 mg total) by mouth daily. 02/03/21   Constancia Delton, MD  atorvastatin  (LIPITOR ) 80 MG tablet Take 1 tablet (80 mg total) by mouth daily. 02/27/23 02/22/24  Constancia Delton, MD  Calcium  Carb-Cholecalciferol (CALCIUM  600+D3 PO) Take 1 tablet by mouth in the morning.    [provider]  Cholecalciferol (VITAMIN D3) 125 MCG  (5000 UT) TABS Take 5,000 Units by mouth.    [provider]  ezetimibe  (ZETIA ) 10 MG tablet Take 1 tablet (10 mg total) by mouth daily. 09/11/22   Constancia Delton, MD  ferrous sulfate  325 (65 FE) MG tablet Take 1 tablet (325 mg total) by mouth 2 (two) times daily with a meal. Patient not taking: Reported on 08/03/2021 11/16/20   Gold, Wayne E, PA-C  folic acid  (FOLVITE ) 1 MG tablet Take 1 mg by mouth in the morning. 12/24/19   [provider]  hydroxychloroquine  (PLAQUENIL ) 200 MG tablet Take 400 mg by mouth in the morning.    [provider]  loratadine  (CLARITIN ) 10 MG tablet Take 10 mg by mouth in the morning.    [provider]  methotrexate (RHEUMATREX) 2.5 MG tablet Take 2.5 mg by mouth once a week. 06/01/21   [provider]  metoprolol  tartrate (LOPRESSOR ) 25 MG tablet Take 0.5 tablets (12.5 mg total) by mouth 2 (two) times daily. 09/11/22   Constancia Delton, MD  Omega-3 1000 MG CAPS Take 1,000 mg by mouth in the morning.    [provider]  omeprazole (PRILOSEC) 20 MG capsule Take 20 mg by mouth in the morning. 02/12/20   [provider]  OTEZLA  30 MG TABS Take 1 tablet by mouth 2 (two) times daily. 05/10/21   [provider]  Allergies    Penicillin g, Penicillin g sodium, and Penicillins    Review of Systems   Review of Systems  Constitutional:  Positive for fatigue. Negative for chills, diaphoresis and fever.  HENT:  Negative for congestion, rhinorrhea and sneezing.   Eyes: Negative.   Respiratory:  Positive for shortness of breath. Negative for cough and chest tightness.   Cardiovascular:  Positive for leg swelling. Negative for chest pain.  Gastrointestinal:  Negative for abdominal pain, blood in stool, diarrhea, nausea and vomiting.  Genitourinary:  Negative for difficulty urinating, flank pain, frequency and hematuria.  Musculoskeletal:  Negative for arthralgias and back pain.  Skin:  Negative for  rash.  Neurological:  Negative for dizziness, speech difficulty, weakness, numbness and headaches.    Physical Exam Updated Vital Signs BP 137/73 (BP Location: Right Arm)   Pulse 90   Temp 98.3 F (36.8 C)   Resp 16   SpO2 100%  Physical Exam Constitutional:      Appearance: She is well-developed.  HENT:     Head: Normocephalic and atraumatic.  Eyes:     Pupils: Pupils are equal, round, and reactive to light.  Cardiovascular:     Rate and Rhythm: Normal rate and regular rhythm.     Heart sounds: Murmur heard.     Comments: Slight murmur Pulmonary:     Effort: Pulmonary effort is normal. No respiratory distress.     Breath sounds: Normal breath sounds. No wheezing or rales.  Chest:     Chest wall: No tenderness.  Abdominal:     General: Bowel sounds are normal.     Palpations: Abdomen is soft.     Tenderness: There is no abdominal tenderness. There is no guarding or rebound.  Musculoskeletal:        General: Normal range of motion.     Cervical back: Normal range of motion and neck supple.     Comments: Trace edema to lower extremities bilaterally, the right leg is slightly more swollen than the left ( she does report she had a valve removed from this leg)  Lymphadenopathy:     Cervical: No cervical adenopathy.  Skin:    General: Skin is warm and dry.     Findings: No rash.  Neurological:     Mental Status: She is alert and oriented to person, place, and time.     ED Results / Procedures / Treatments   Labs (all labs ordered are listed, but only abnormal results are displayed) Labs Reviewed  COMPREHENSIVE METABOLIC PANEL WITH GFR - Abnormal; Notable for the following components:      Result Value   CO2 21 (*)    Glucose, Bld 187 (*)    Total Protein 5.7 (*)    Albumin  3.2 (*)    All other components within normal limits  CBC - Abnormal; Notable for the following components:   RBC 2.95 (*)    Hemoglobin 6.1 (*)    HCT 21.8 (*)    MCV 73.9 (*)    MCH 20.7  (*)    MCHC 28.0 (*)    RDW 19.2 (*)    nRBC 0.4 (*)    All other components within normal limits  POC OCCULT BLOOD, ED - Abnormal; Notable for the following components:   Fecal Occult Bld POSITIVE (*)    All other components within normal limits  TYPE AND SCREEN  PREPARE RBC (CROSSMATCH)    EKG None  Radiology No results found.  Procedures Procedures  Medications Ordered in ED Medications  0.9 %  sodium chloride  infusion (Manually program via Guardrails IV Fluids) (has no administration in time range)    ED Course/ Medical Decision Making/ A&P                                 Medical Decision Making Amount and/or Complexity of Data Reviewed Labs: ordered.  Risk Prescription drug management. Decision regarding hospitalization.   Patient is a 78 year old who presents with some general fatigue and some mild exertional shortness of breath.  Her hemoglobin is 6.1.  She does not report any obvious GI bleed.  On exam, her stool is brown but it is Hemoccult positive.  She does not have any associated abdominal pain.  Her blood pressure is stable.  She has been typed and crossed for 1 unit of packed red cells.  I have discussed with the internal medicine teaching service who will admit the patient for further treatment.  Ultrasound was ordered of the right leg to rule out DVT given that patient does have some increased swelling of this leg as compared to the left although she does states she had a valve removed in this leg in the past but she cannot tell me that it is normally more swollen than the other side.  I was notified that the vascular ultrasound tech left at 5:00 this afternoon rather than 7 and it will be done in the morning.  I notified the internal medicine resident and they will follow up on this.  CRITICAL CARE Performed by: Hershel Los Total critical care time: 45 minutes Critical care time was exclusive of separately billable procedures and treating other  patients. Critical care was necessary to treat or prevent imminent or life-threatening deterioration. Critical care was time spent personally by me on the following activities: development of treatment plan with patient and/or surrogate as well as nursing, discussions with consultants, evaluation of patient's response to treatment, examination of patient, obtaining history from patient or surrogate, ordering and performing treatments and interventions, ordering and review of laboratory studies, ordering and review of radiographic studies, pulse oximetry and re-evaluation of patient's condition.   Final Clinical Impression(s) / ED Diagnoses Final diagnoses:  Symptomatic anemia    Rx / DC Orders ED Discharge Orders     None         Hershel Los, MD 05/03/23 1716    Hershel Los, MD 05/03/23 1752

## 2023-05-04 ENCOUNTER — Observation Stay (HOSPITAL_COMMUNITY)

## 2023-05-04 DIAGNOSIS — K3189 Other diseases of stomach and duodenum: Secondary | ICD-10-CM | POA: Diagnosis not present

## 2023-05-04 DIAGNOSIS — D120 Benign neoplasm of cecum: Secondary | ICD-10-CM | POA: Diagnosis not present

## 2023-05-04 DIAGNOSIS — D123 Benign neoplasm of transverse colon: Secondary | ICD-10-CM | POA: Diagnosis not present

## 2023-05-04 DIAGNOSIS — D509 Iron deficiency anemia, unspecified: Principal | ICD-10-CM

## 2023-05-04 DIAGNOSIS — K5730 Diverticulosis of large intestine without perforation or abscess without bleeding: Secondary | ICD-10-CM | POA: Diagnosis not present

## 2023-05-04 DIAGNOSIS — K2980 Duodenitis without bleeding: Secondary | ICD-10-CM | POA: Diagnosis not present

## 2023-05-04 DIAGNOSIS — D122 Benign neoplasm of ascending colon: Secondary | ICD-10-CM | POA: Diagnosis not present

## 2023-05-04 DIAGNOSIS — D125 Benign neoplasm of sigmoid colon: Secondary | ICD-10-CM | POA: Diagnosis not present

## 2023-05-04 DIAGNOSIS — R195 Other fecal abnormalities: Secondary | ICD-10-CM

## 2023-05-04 DIAGNOSIS — D649 Anemia, unspecified: Secondary | ICD-10-CM

## 2023-05-04 DIAGNOSIS — M7989 Other specified soft tissue disorders: Secondary | ICD-10-CM

## 2023-05-04 DIAGNOSIS — K222 Esophageal obstruction: Secondary | ICD-10-CM | POA: Diagnosis not present

## 2023-05-04 DIAGNOSIS — R600 Localized edema: Secondary | ICD-10-CM

## 2023-05-04 DIAGNOSIS — K219 Gastro-esophageal reflux disease without esophagitis: Secondary | ICD-10-CM | POA: Diagnosis not present

## 2023-05-04 LAB — CBC
HCT: 24.8 % — ABNORMAL LOW (ref 36.0–46.0)
HCT: 28.7 % — ABNORMAL LOW (ref 36.0–46.0)
Hemoglobin: 7.6 g/dL — ABNORMAL LOW (ref 12.0–15.0)
Hemoglobin: 8.5 g/dL — ABNORMAL LOW (ref 12.0–15.0)
MCH: 22.7 pg — ABNORMAL LOW (ref 26.0–34.0)
MCH: 22.3 pg — ABNORMAL LOW (ref 26.0–34.0)
MCHC: 30.6 g/dL (ref 30.0–36.0)
MCHC: 29.6 g/dL — ABNORMAL LOW (ref 30.0–36.0)
MCV: 74 fL — ABNORMAL LOW (ref 80.0–100.0)
MCV: 75.1 fL — ABNORMAL LOW (ref 80.0–100.0)
Platelets: 308 10*3/uL (ref 150–400)
Platelets: 353 10*3/uL (ref 150–400)
RBC: 3.35 MIL/uL — ABNORMAL LOW (ref 3.87–5.11)
RBC: 3.82 MIL/uL — ABNORMAL LOW (ref 3.87–5.11)
RDW: 19.7 % — ABNORMAL HIGH (ref 11.5–15.5)
RDW: 19.8 % — ABNORMAL HIGH (ref 11.5–15.5)
WBC: 9 10*3/uL (ref 4.0–10.5)
WBC: 15.1 10*3/uL — ABNORMAL HIGH (ref 4.0–10.5)
nRBC: 0.2 % (ref 0.0–0.2)
nRBC: 0.4 % — ABNORMAL HIGH (ref 0.0–0.2)

## 2023-05-04 LAB — BPAM RBC
Blood Product Expiration Date: 202505262359
ISSUE DATE / TIME: 202505021740
Unit Type and Rh: 6200

## 2023-05-04 LAB — BASIC METABOLIC PANEL WITH GFR
Anion gap: 9 (ref 5–15)
BUN: 9 mg/dL (ref 8–23)
CO2: 21 mmol/L — ABNORMAL LOW (ref 22–32)
Calcium: 8.6 mg/dL — ABNORMAL LOW (ref 8.9–10.3)
Chloride: 109 mmol/L (ref 98–111)
Creatinine, Ser: 0.68 mg/dL (ref 0.44–1.00)
GFR, Estimated: >60 mL/min (ref >60)
Glucose, Bld: 128 mg/dL — ABNORMAL HIGH (ref 70–99)
Potassium: 3.7 mmol/L (ref 3.5–5.1)
Sodium: 139 mmol/L (ref 135–145)

## 2023-05-04 LAB — TYPE AND SCREEN
ABO/RH(D): A POS
Antibody Screen: NEGATIVE
Unit division: 0

## 2023-05-04 LAB — FOLATE: Folate: 21.1 ng/mL (ref >5.9)

## 2023-05-04 LAB — IRON AND TIBC
Iron: 20 ug/dL — ABNORMAL LOW (ref 28–170)
Saturation Ratios: 4 % — ABNORMAL LOW (ref 10.4–31.8)
TIBC: 503 ug/dL — ABNORMAL HIGH (ref 250–450)
UIBC: 483 ug/dL

## 2023-05-04 LAB — FERRITIN: Ferritin: 5 ng/mL — ABNORMAL LOW (ref 11–307)

## 2023-05-04 LAB — GLUCOSE, CAPILLARY: Glucose-Capillary: 196 mg/dL — ABNORMAL HIGH (ref 70–99)

## 2023-05-04 MED ORDER — SODIUM CHLORIDE 0.9 % IV SOLN
500.0000 mg | Freq: Once | INTRAVENOUS | Status: AC
  Administered 2023-05-04: 500 mg via INTRAVENOUS
  Filled 2023-05-04: qty 500

## 2023-05-04 MED ORDER — SIMETHICONE 80 MG PO CHEW
240.0000 mg | CHEWABLE_TABLET | Freq: Once | ORAL | Status: AC
  Administered 2023-05-04: 240 mg via ORAL
  Filled 2023-05-04: qty 3

## 2023-05-04 MED ORDER — IRON SUCROSE 500 MG IVPB - SIMPLE MED
500.0000 mg | Freq: Once | INTRAVENOUS | Status: DC
  Filled 2023-05-04: qty 275

## 2023-05-04 MED ORDER — NA SULFATE-K SULFATE-MG SULF 17.5-3.13-1.6 GM/177ML PO SOLN
0.5000 | Freq: Once | ORAL | Status: AC
  Administered 2023-05-04: 177 mL via ORAL
  Filled 2023-05-04: qty 1

## 2023-05-04 MED ORDER — ONDANSETRON HCL 4 MG/2ML IJ SOLN
4.0000 mg | Freq: Three times a day (TID) | INTRAMUSCULAR | Status: DC | PRN
  Administered 2023-05-04: 4 mg via INTRAVENOUS
  Filled 2023-05-04: qty 2

## 2023-05-04 MED ORDER — NA SULFATE-K SULFATE-MG SULF 17.5-3.13-1.6 GM/177ML PO SOLN
0.5000 | Freq: Once | ORAL | Status: AC
  Administered 2023-05-04: 177 mL via ORAL

## 2023-05-04 NOTE — Consult Note (Signed)
 Consultation Note   Referring Provider:  Internal Medicine Teaching Service PCP: Annette Barters, MD Primary Gastroenterologist::    Unassigned      Reason for Consultation: Anemia and intermittent melena DOA: 05/03/2023         Hospital Day: 2   ASSESSMENT    78 y.o. year old female with a medical history including but not limited to chronic GERD, DM2 , hypertension, hyperlipidemia, rheumatoid arthritis, CAD status CABG /  bioprosthetic AVR  Severe iron deficiency anemia / FOBT positive Intermittent dark stools over the last 6 months.  Presenting hemoglobin 6.1 (baseline unknown but was 12.4 in 2022). Rule out erosive disease (ASA, Fosamax).  PUD?  AVMs?  Gastrointestinal neoplasm?  RLE edema.  US  doppler pending  Chronic GERD Symptoms usually controlled well on daily PPI (depending on diet)  Rheumatoid arthritis, on methotrexate Normal liver enzymes  Psoriasis on Plaquenil  and Otezla   See PMH for additional history  Principal Problem:   Symptomatic anemia    PLAN:   --Monitor H&H. Hemoglobin improved to 7.6 post 1 unit RBCs --check folic acid   -- IV iron infusion in progress --Schedule for a EGD and colonoscopy. The risks and benefits of colonoscopy with possible polypectomy / biopsies were discussed and the patient agrees to proceed.  --Clear liquids today, NPO after MN  HPI   Patient presented to the ED yesterday after receiving a call from her PCP about her hemoglobin being low.  Patient is on multiple medications at home but notably she takes a daily baby aspirin , Fosamax, and daily PPI.  She has been having occasional dark stools over the last 6 months.  She does not take iron.  No bismuth use.  She has no focal GI symptoms.  Specifically no abdominal pain, nausea, vomiting, bowel changes or weight loss.  She reports having had a negative Cologuard approximately 2 years ago.  She has not had lab work in at least a  couple years so baseline hemoglobin is unknown but in 2022 her hemoglobin was 12.4.  She has felt okay, actually went on vacation to [place]  recently and did extensive walking around.  No known history of celiac disease.  She has not had any vaginal bleeding, hematuria.  She does not donate blood  Pertinent labs: Hemoglobin 6.1 (baseline unknown but was 12.4 in 2022), MCV 74, platelets 365, ferritin 5 , TIBC 583 with 4% iron saturation . Albumin  3.2, LFTs otherwise normal.  FOBT positive, normal BUN and creatinine  After unit of blood hemoglobin has improved to 7.6    Previous GI Studies   Reports negative Cologuard 2 years ago  No prior history of colonoscopy  Labs and Imaging:  Recent Labs    05/03/23 1505 05/03/23 2159 05/04/23 0555  WBC 10.0  --  9.0  HGB 6.1* 7.3* 7.6*  HCT 21.8* 24.2* 24.8*  MCV 73.9*  --  74.0*  PLT 365  --  308   Recent Labs    05/04/23 0555  FERRITIN 5*  TIBC 503*  IRONPCTSAT 4*   Recent Labs    05/03/23 1505 05/04/23 0555  NA 140 139  K 4.4 3.7  CL 109 109  CO2 21* 21*  GLUCOSE 187* 128*  BUN 8 9  CREATININE 0.73 0.68  CALCIUM  9.2 8.6*   Recent Labs    05/03/23 1505  PROT 5.7*  ALBUMIN  3.2*  AST 26  ALT 28  ALKPHOS 42  BILITOT 0.5   No results for input(s): "INR" in the last 72 hours. No results for input(s): "AFPTUMOR" in the last 72 hours.  ECHOCARDIOGRAM COMPLETE    ECHOCARDIOGRAM REPORT       Patient Name:   Shelley Berg Date of Exam: 10/08/2022 Medical Rec #:  [id number]         Height:       59.0 in Accession #:    [id number]        Weight:       146.6 lb Date of Birth:  [birth year]          BSA:          1.616 m Patient Age:    77 years          BP:           122/78 mmHg Patient Gender: F                 HR:           62 bpm. Exam Location:    Procedure: 2D Echo, Cardiac Doppler, Color Doppler and Strain Analysis  Indications:    I25.110 Atherosclerotic heart disease of native coronary artery                  with unstable angina pectoris   History:        Patient has prior history of Echocardiogram examinations, most                 recent 12/22/2020. CAD, Prior CABG and Prior Cardiac Surgery,                 Aortic Valve Disease and 21 mm Inspiris Resilia,                 Signs/Symptoms:Murmur; Risk Factors:Hypertension, Dyslipidemia                 and Non-Smoker.                 Aortic Valve: bioprosthetic valve is present in the aortic                 position.   Sonographer:    Venson Ginger MHA, BS, RDCS Referring Phys: [id number] BRIAN AGBOR-ETANG  IMPRESSIONS   1. Left ventricular ejection fraction, by estimation, is 55 to 60%. The left ventricle has normal function. The left ventricle has no regional wall motion abnormalities. There is mild left ventricular hypertrophy. Left ventricular diastolic parameters  are consistent with Grade II diastolic dysfunction (pseudonormalization). The average left ventricular global longitudinal strain is -16.5 %. The global longitudinal strain is normal.  2. Right ventricular systolic function is normal. The right ventricular size is normal. Mildly increased right ventricular wall thickness.  3. Left atrial size was moderately dilated.  4. The mitral valve is degenerative. Moderate mitral valve regurgitation.  5. The aortic valve has been repaired/replaced. Aortic valve regurgitation is not visualized. There is a bioprosthetic valve present in the aortic position. Aortic valve mean gradient measures 7.5 mmHg.  6. Aortic dilatation noted. There is mild dilatation of the ascending aorta, measuring 43 mm.  7. The inferior vena cava is normal in size with greater than 50% respiratory variability, suggesting right atrial pressure of  3 mmHg.  FINDINGS  Left Ventricle: Left ventricular ejection fraction, by estimation, is 55 to 60%. The left ventricle has normal function. The left ventricle has no regional wall motion abnormalities. The average  left ventricular global longitudinal strain is -16.5 %.  The global longitudinal strain is normal. The left ventricular internal cavity size was normal in size. There is mild left ventricular hypertrophy. Left ventricular diastolic parameters are consistent with Grade II diastolic dysfunction  (pseudonormalization).  Right Ventricle: The right ventricular size is normal. Mildly increased right ventricular wall thickness. Right ventricular systolic function is normal.  Left Atrium: Left atrial size was moderately dilated.  Right Atrium: Right atrial size was normal in size.  Pericardium: There is no evidence of pericardial effusion.  Mitral Valve: The mitral valve is degenerative in appearance. There is moderate calcification of the mitral valve leaflet(s). Moderate mitral valve regurgitation.  Tricuspid Valve: The tricuspid valve is grossly normal. Tricuspid valve regurgitation is not demonstrated.  Aortic Valve: The aortic valve has been repaired/replaced. Aortic valve regurgitation is not visualized. Aortic valve mean gradient measures 7.5 mmHg. Aortic valve peak gradient measures 14.1 mmHg. There is a bioprosthetic valve present in the aortic  position.  Pulmonic Valve: The pulmonic valve was normal in structure. Pulmonic valve regurgitation is not visualized.  Aorta: Aortic dilatation noted. There is mild dilatation of the ascending aorta, measuring 43 mm.  Venous: The inferior vena cava is normal in size with greater than 50% respiratory variability, suggesting right atrial pressure of 3 mmHg.  IAS/Shunts: The interatrial septum was not well visualized.    LEFT VENTRICLE PLAX 2D LVIDd:         3.80 cm Diastology LVIDs:         2.40 cm LV e' medial:    4.57 cm/s LV PW:         1.30 cm LV E/e' medial:  21.3 LV IVS:        1.30 cm LV e' lateral:   9.14 cm/s                        LV E/e' lateral: 10.7                          2D Longitudinal Strain                        2D  Strain GLS Avg:     -16.5 %  RIGHT VENTRICLE RV S prime:     11.90 cm/s TAPSE (M-mode): 1.2 cm  LEFT ATRIUM             Index LA diam:        3.90 cm 2.41 cm/m LA Vol (A2C):   48.9 ml 30.25 ml/m LA Vol (A4C):   68.7 ml 42.51 ml/m LA Biplane Vol: 58.9 ml 36.44 ml/m  AORTIC VALVE AV Vmax:           188.00 cm/s AV Vmean:          126.000 cm/s AV VTI:            0.398 m AV Peak Grad:      14.1 mmHg AV Mean Grad:      7.5 mmHg LVOT Vmax:         114.50 cm/s LVOT Vmean:        74.950 cm/s LVOT VTI:  0.238 m LVOT/AV VTI ratio: 0.60   AORTA Ao Asc diam: 4.30 cm  MITRAL VALVE MV Area (PHT): 2.76 cm     SHUNTS MV Decel Time: 275 msec     Systemic VTI: 0.24 m MV E velocity: 97.50 cm/s MV A velocity: 110.00 cm/s MV E/A ratio:  0.89  Constancia Delton MD Electronically signed by Constancia Delton MD Signature Date/Time: 10/09/2022/8:28:59 AM      Final      Past Medical History:  Diagnosis Date   Aortic stenosis    a. 05/2020 Echo: EF 70-75%, GrI DD, mod-sev AS; b. 08/2020 Cath: AS peak grad w/ AvA 1.00 cm^2; c. 11/2020 SAVR: s/p 21mm Edwards INSPIRIS RESILIA pericardial valve.   Asthma    as a child, no problems as an adult, no inhalers   CAD (coronary artery disease)    a. 08/2020 Cath: LM nl, LAD 60p, 34m, LCX 30ost/p, 65m, RCA 161m w/ L->R collats filling RPDA from LAD; b. 11/2020 CABG x 2 (w/ AVR): VG->OM, VG->RPDA.   Dyspnea    with exertion   GERD (gastroesophageal reflux disease)    Heart murmur    Hyperlipidemia    Hypertension    Obesity    Pre-diabetes    Rheumatoid arthritis (HCC)    Seasonal allergies     Past Surgical History:  Procedure Laterality Date   AORTIC VALVE REPLACEMENT N/A 11/11/2020   Procedure: AORTIC VALVE REPLACEMENT (AVR) WITH INSPIRIS RESILIA AORTIC VALVE SIZE ;  Surgeon: Bartley Lightning, MD;  Location: [facility] OR;  Service: Open Heart Surgery;  Laterality: N/A;   CORONARY ARTERY BYPASS GRAFT N/A 11/11/2020    Procedure: CORONARY ARTERY BYPASS GRAFTING (CABG) X  2 ON PUMP USING RIGHT ENDOSCOPIC GREATER SAPHENOUS VEIN CONDUITS;  Surgeon: Bartley Lightning, MD;  Location: [facility] OR;  Service: Open Heart Surgery;  Laterality: N/A;   ENDOVEIN HARVEST OF GREATER SAPHENOUS VEIN Right 11/11/2020   Procedure: ENDOVEIN HARVEST OF GREATER SAPHENOUS VEIN;  Surgeon: Bartley Lightning, MD;  Location: [facility] OR;  Service: Open Heart Surgery;  Laterality: Right;   RIGHT/LEFT HEART CATH AND CORONARY ANGIOGRAPHY N/A 08/10/2020   Procedure: RIGHT/LEFT HEART CATH AND CORONARY ANGIOGRAPHY;  Surgeon: Wenona Hamilton, MD;  Location: [facility] INVASIVE CV LAB;  Service: Cardiovascular;  Laterality: N/A;   TEE WITHOUT CARDIOVERSION N/A 11/11/2020   Procedure: TRANSESOPHAGEAL ECHOCARDIOGRAM (TEE);  Surgeon: Bartley Lightning, MD;  Location[facility];  Service: Open Heart Surgery;  Laterality: N/A;    Family History  Problem Relation Age of Onset   Stroke Mother    Stroke Father    Heart attack Father     Prior to Admission medications   Medication Sig Start Date End Date Taking? Authorizing Provider  acetaminophen  (TYLENOL ) 500 MG tablet Take 1,000 mg by mouth every 6 (six) hours as needed for moderate pain.   Yes [provider]  alendronate (FOSAMAX) 70 MG tablet Take 70 mg by mouth once a week. Monday 06/22/21  Yes [provider]  aspirin  81 MG EC tablet Take 1 tablet (81 mg total) by mouth daily. 02/03/21  Yes Constancia Delton, MD  atorvastatin  (LIPITOR ) 80 MG tablet Take 1 tablet (80 mg total) by mouth daily. 02/27/23 02/22/24 Yes Agbor-Etang, Polly Brink, MD  Calcium  Carb-Cholecalciferol (CALCIUM  600+D3 PO) Take 1 tablet by mouth in the morning.   Yes [provider]  Cholecalciferol (VITAMIN D3) 125 MCG (5000 UT) TABS Take 5,000 Units by mouth.   Yes [provider]  ezetimibe  (ZETIA ) 10 MG tablet Take 1 tablet (10 mg total) by mouth daily. 09/11/22  Yes Agbor-Etang, Polly Brink, MD  folic acid  (FOLVITE ) 1 MG tablet  Take 1 mg by mouth in the morning. 12/24/19  Yes [provider]  hydroxychloroquine  (PLAQUENIL ) 200 MG tablet Take 400 mg by mouth in the morning.   Yes [provider]  loratadine  (CLARITIN ) 10 MG tablet Take 10 mg by mouth in the morning.   Yes [provider]  metFORMIN (GLUCOPHAGE-XR) 500 MG 24 hr tablet Take 500 mg by mouth daily with breakfast.   Yes [provider]  methotrexate (RHEUMATREX) 2.5 MG tablet Take 10 mg by mouth once a week. Mondays 06/01/21  Yes [provider]  metoprolol  tartrate (LOPRESSOR ) 25 MG tablet Take 0.5 tablets (12.5 mg total) by mouth 2 (two) times daily. 09/11/22  Yes Agbor-Etang, Polly Brink, MD  Omega-3 1000 MG CAPS Take 1,000 mg by mouth in the morning.   Yes [provider]  omeprazole (PRILOSEC) 20 MG capsule Take 20 mg by mouth in the morning. 02/12/20  Yes [provider]  OTEZLA  30 MG TABS Take 30 mg by mouth 2 (two) times daily. 05/10/21  Yes [provider]  ferrous sulfate  325 (65 FE) MG tablet Take 1 tablet (325 mg total) by mouth 2 (two) times daily with a meal. Patient not taking: Reported on 08/03/2021 11/16/20   Gold, Wayne E, PA-C    Current Facility-Administered Medications  Medication Dose Route Frequency Provider Last Rate Last Admin   acetaminophen  (TYLENOL ) tablet 650 mg  650 mg Oral Q6H PRN Atway, Rayann N, DO       Or   acetaminophen  (TYLENOL ) suppository 650 mg  650 mg Rectal Q6H PRN Atway, Rayann N, DO       Apremilast  TABS 30 mg  30 mg Oral BID Atway, Rayann N, DO   30 mg at 05/03/23 2210   atorvastatin  (LIPITOR ) tablet 80 mg  80 mg Oral Daily Atway, Rayann N, DO       ezetimibe  (ZETIA ) tablet 10 mg  10 mg Oral Daily Atway, Rayann N, DO       folic acid  (FOLVITE ) tablet 1 mg  1 mg Oral q AM Atway, Rayann N, DO       hydroxychloroquine  (PLAQUENIL ) tablet 400 mg  400 mg Oral q AM Atway, Rayann N, DO       iron sucrose (VENOFER) 500 mg in sodium chloride  0.9 % 250 mL IVPB  500  mg Intravenous Once Priscella Brooms, DO 67 mL/hr at 05/04/23 1034 500 mg at 05/04/23 1034   metoprolol  tartrate (LOPRESSOR ) tablet 12.5 mg  12.5 mg Oral BID Atway, Rayann N, DO   12.5 mg at 05/03/23 2210   pantoprazole  (PROTONIX ) injection 40 mg  40 mg Intravenous Q12H Atway, Rayann N, DO   40 mg at 05/03/23 2210    Allergies as of 05/03/2023 - Review Complete 05/03/2023  Allergen Reaction Noted   Penicillin g Other (See Comments) 06/01/2021   Penicillin g sodium Other (See Comments) 06/01/2021   Penicillins Rash 07/05/2020    Social History   Socioeconomic History   Marital status: Widowed    Spouse name: Not on file   Number of children: Not on file   Years of education: Not on file   Highest education level: Not on file  Occupational History   Not on file  Tobacco Use   Smoking status: Never    Passive exposure: Never  Smokeless tobacco: Never  Vaping Use   Vaping status: Never Used  Substance and Sexual Activity   Alcohol use: Never   Drug use: Never   Sexual activity: Not on file  Other Topics Concern   Not on file  Social History Narrative   Not on file   Social Drivers of Health   Financial Resource Strain: Not on file  Food Insecurity: No Food Insecurity (05/03/2023)   Hunger Vital Sign    Worried About Running Out of Food in the Last Year: Never true    Ran Out of Food in the Last Year: Never true  Transportation Needs: No Transportation Needs (05/03/2023)   PRAPARE - Administrator, Civil Service (Medical): No    Lack of Transportation (Non-Medical): No  Physical Activity: Not on file  Stress: Not on file  Social Connections: Not on file  Intimate Partner Violence: Not At Risk (05/03/2023)   Humiliation, Afraid, Rape, and Kick questionnaire    Fear of Current or Ex-Partner: No    Emotionally Abused: No    Physically Abused: No    Sexually Abused: No     Code Status   Code Status: Full Code  Review of Systems: All systems reviewed and  negative except where noted in HPI.  Physical Exam: Vital signs in last 24 hours: Temp:  [98.2 F (36.8 C)-99.1 F (37.3 C)] 99 F (37.2 C) (05/03 1047) Pulse Rate:  [62-90] 64 (05/03 1047) Resp:  [16-21] 16 (05/03 1047) BP: (125-146)/(59-76) 146/61 (05/03 1047) SpO2:  [98 %-100 %] 98 % (05/03 1047) Last BM Date : 05/02/23  General:  Pleasant female in NAD Psych:  Cooperative. Normal mood and affect Eyes: Pupils equal Ears:  Normal auditory acuity Nose: No deformity, discharge or lesions Neck:  Supple, no masses felt Lungs:  Clear to auscultation.  Heart:  Regular rate, regular rhythm.  Abdomen:  Soft, nondistended, nontender, active bowel sounds, no masses felt Rectal :  Deferred Msk: Symmetrical without gross deformities.  Neurologic:  Alert, oriented, grossly normal neurologically Extremities : No edema Skin:  Intact without significant lesions.    Intake/Output from previous day: 05/02 0701 - 05/03 0700 In: 512 [P.O.:300; Blood:212] Out: -  Intake/Output this shift:  No intake/output data recorded.   Mai Schwalbe, NP-C   05/04/2023, 11:50 AM

## 2023-05-04 NOTE — Plan of Care (Signed)

## 2023-05-04 NOTE — Progress Notes (Signed)
 RLE venous duplex has been completed.   Results can be found under chart review under CV PROC. 05/04/2023 2:11 PM Soham Hollett RVT, RDMS

## 2023-05-04 NOTE — Care Management Obs Status (Signed)
 MEDICARE OBSERVATION STATUS NOTIFICATION   Patient Details  Name: YARELIE BURKY MRN: [id number] Date of Birth: [birth year]   Medicare Observation Status Notification Given:  Yes    Omie Bickers, RN 05/04/2023, 11:30 AM

## 2023-05-04 NOTE — Progress Notes (Signed)
 HD#0 SUBJECTIVE:  Patient Summary:   Shelley Berg is a 78 yo female with HTN, HLD, GERD, rheumatoid arthritis, prediabetes, CAD s/p CABG and aortic stenosis s/p valve repair in 2022 who presents with a hemoglobin of 6.1 and intermittent melena, and worsening SOB and fatigue admitted for symptomatic anemia.   Overnight Events: NAEO  Interim History:  Feels better after having blood last night.   OBJECTIVE:  Vital Signs: Vitals:   05/04/23 0011 05/04/23 0411 05/04/23 1047 05/04/23 1732  BP: (!) 126/59 131/72 (!) 146/61 113/63  Pulse: 71 62 64 60  Resp: 16 16 16    Temp: 99.1 F (37.3 C) 98.2 F (36.8 C) 99 F (37.2 C) 98.5 F (36.9 C)  TempSrc: Oral Oral Oral Oral  SpO2: 99% 99% 98% 97%   Supplemental O2: Room Air SpO2: 97 %  There were no vitals filed for this visit.   Intake/Output Summary (Last 24 hours) at 05/04/2023 1927 Last data filed at 05/04/2023 0018 Gross per 24 hour  Intake 512 ml  Output --  Net 512 ml   Net IO Since Admission: 512 mL [05/04/23 1927]  Physical Exam:  Constitutional: well-appearing elderly female sitting in bed, in no acute distress Cardiovascular: regular rate and rhythm, systolic murmur appreciated Pulmonary/Chest: normal work of breathing on room air, lungs clear to auscultation bilaterally Abdominal: soft, non-tender, non-distended MSK: normal bulk and tone, slight swelling of RLE compared to LLE.  Neurological: alert & oriented x 3, no focal deficits Skin: erythematous ulcers on her bilateral extremities with hemorrhagic crust.  Psych: normal mood and affect  Patient Lines/Drains/Airways Status     Active Line/Drains/Airways     Name Placement date Placement time Site Days   Peripheral IV 05/03/23 20 G Anterior;Left;Upper Arm 05/03/23  1612  Arm  1   Incision (Closed) 11/11/20 Chest 11/11/20  0819  -- 904   Incision (Closed) 11/11/20 Leg Right 11/11/20  1124  -- 904             ASSESSMENT/PLAN:   Assessment: Principal Problem:   Symptomatic anemia Active Problems:   Iron deficiency anemia   Heme positive stool   Plan:  Symptomatic Anemia Feels better after having 2 units of pRBCs last night. Feels like she is stronger. She is iron deficient, T sat is 4 and ferritin is 5. She will likely benefit from venofer as well. She has a 883mg  deficit with a target hemoglobin of 9 which seems to be her baseline. Will monitor CBC with HH as it was 7.6 this AM. Intermittent bleed and fairly stable.    -GI will see her today should she need EGD inpatient or outpatient  -IV iron  -transfuse if Hgb <7  -Protonix  40mg  BID    Lower extremity edema R  Venous doppler pending. Edema improved, but with travel history I am concerned that she may have a clot. Likely venous insufficiency.  -US  venous doppler   CAD s/p CABG  Cw lipitor  80mg  daily amd zetia  10mg  daily. Will continue to hold ASA 81mg  due to bleed.    T2DM  Takes metformin 500mg  BID, will hold. Hgb A1c was 6.2. Glucose 128 this AM. Will CTM.   RA  Psoriasis  Hydroxychloroquine  400mg  at bedtime, methotrexate 2.5mg  once a week and folic acid  1 mg daily. Otezla /Apremilast  30 mg BID. Will continue with same.    HTN  On metoprolol  12.5, will cw same given that BP is WNL.  -Monitor Vitals given active bleed.  Signature: [facility]  Internal Medicine Resident, PGY-1 [facility]   On call pager  [phone].

## 2023-05-04 NOTE — H&P (View-Only) (Signed)
 Consultation Note   Referring Provider:  Internal Medicine Teaching Service PCP: Annette Barters, MD Primary Gastroenterologist::    Unassigned      Reason for Consultation: Anemia and intermittent melena DOA: 05/03/2023         Hospital Day: 2   ASSESSMENT    78 y.o. year old female with a medical history including but not limited to chronic GERD, DM2 , hypertension, hyperlipidemia, rheumatoid arthritis, CAD status CABG /  bioprosthetic AVR  Severe iron deficiency anemia / FOBT positive Intermittent dark stools over the last 6 months.  Presenting hemoglobin 6.1 (baseline unknown but was 12.4 in 2022). Rule out erosive disease (ASA, Fosamax).  PUD?  AVMs?  Gastrointestinal neoplasm?  RLE edema.  US  doppler pending  Chronic GERD Symptoms usually controlled well on daily PPI (depending on diet)  Rheumatoid arthritis, on methotrexate Normal liver enzymes  Psoriasis on Plaquenil  and Otezla   See PMH for additional history  Principal Problem:   Symptomatic anemia    PLAN:   --Monitor H&H. Hemoglobin improved to 7.6 post 1 unit RBCs --check folic acid   -- IV iron infusion in progress --Schedule for a EGD and colonoscopy. The risks and benefits of colonoscopy with possible polypectomy / biopsies were discussed and the patient agrees to proceed.  --Clear liquids today, NPO after MN  HPI   Patient presented to the ED yesterday after receiving a call from her PCP about her hemoglobin being low.  Patient is on multiple medications at home but notably she takes a daily baby aspirin , Fosamax, and daily PPI.  She has been having occasional dark stools over the last 6 months.  She does not take iron.  No bismuth use.  She has no focal GI symptoms.  Specifically no abdominal pain, nausea, vomiting, bowel changes or weight loss.  She reports having had a negative Cologuard approximately 2 years ago.  She has not had lab work in at least a  couple years so baseline hemoglobin is unknown but in 2022 her hemoglobin was 12.4.  She has felt okay, actually went on vacation to [place]  recently and did extensive walking around.  No known history of celiac disease.  She has not had any vaginal bleeding, hematuria.  She does not donate blood  Pertinent labs: Hemoglobin 6.1 (baseline unknown but was 12.4 in 2022), MCV 74, platelets 365, ferritin 5 , TIBC 583 with 4% iron saturation . Albumin  3.2, LFTs otherwise normal.  FOBT positive, normal BUN and creatinine  After unit of blood hemoglobin has improved to 7.6    Previous GI Studies   Reports negative Cologuard 2 years ago  No prior history of colonoscopy  Labs and Imaging:  Recent Labs    05/03/23 1505 05/03/23 2159 05/04/23 0555  WBC 10.0  --  9.0  HGB 6.1* 7.3* 7.6*  HCT 21.8* 24.2* 24.8*  MCV 73.9*  --  74.0*  PLT 365  --  308   Recent Labs    05/04/23 0555  FERRITIN 5*  TIBC 503*  IRONPCTSAT 4*   Recent Labs    05/03/23 1505 05/04/23 0555  NA 140 139  K 4.4 3.7  CL 109 109  CO2 21* 21*  GLUCOSE 187* 128*  BUN 8 9  CREATININE 0.73 0.68  CALCIUM  9.2 8.6*   Recent Labs    05/03/23 1505  PROT 5.7*  ALBUMIN  3.2*  AST 26  ALT 28  ALKPHOS 42  BILITOT 0.5   No results for input(s): "INR" in the last 72 hours. No results for input(s): "AFPTUMOR" in the last 72 hours.  ECHOCARDIOGRAM COMPLETE    ECHOCARDIOGRAM REPORT       Patient Name:   Shelley Berg Date of Exam: 10/08/2022 Medical Rec #:  [id number]         Height:       59.0 in Accession #:    [id number]        Weight:       146.6 lb Date of Birth:  [birth year]          BSA:          1.616 m Patient Age:    77 years          BP:           122/78 mmHg Patient Gender: F                 HR:           62 bpm. Exam Location:    Procedure: 2D Echo, Cardiac Doppler, Color Doppler and Strain Analysis  Indications:    I25.110 Atherosclerotic heart disease of native coronary artery                  with unstable angina pectoris   History:        Patient has prior history of Echocardiogram examinations, most                 recent 12/22/2020. CAD, Prior CABG and Prior Cardiac Surgery,                 Aortic Valve Disease and 21 mm Inspiris Resilia,                 Signs/Symptoms:Murmur; Risk Factors:Hypertension, Dyslipidemia                 and Non-Smoker.                 Aortic Valve: bioprosthetic valve is present in the aortic                 position.   Sonographer:    Venson Ginger MHA, BS, RDCS Referring Phys: [id number] BRIAN AGBOR-ETANG  IMPRESSIONS   1. Left ventricular ejection fraction, by estimation, is 55 to 60%. The left ventricle has normal function. The left ventricle has no regional wall motion abnormalities. There is mild left ventricular hypertrophy. Left ventricular diastolic parameters  are consistent with Grade II diastolic dysfunction (pseudonormalization). The average left ventricular global longitudinal strain is -16.5 %. The global longitudinal strain is normal.  2. Right ventricular systolic function is normal. The right ventricular size is normal. Mildly increased right ventricular wall thickness.  3. Left atrial size was moderately dilated.  4. The mitral valve is degenerative. Moderate mitral valve regurgitation.  5. The aortic valve has been repaired/replaced. Aortic valve regurgitation is not visualized. There is a bioprosthetic valve present in the aortic position. Aortic valve mean gradient measures 7.5 mmHg.  6. Aortic dilatation noted. There is mild dilatation of the ascending aorta, measuring 43 mm.  7. The inferior vena cava is normal in size with greater than 50% respiratory variability, suggesting right atrial pressure of  3 mmHg.  FINDINGS  Left Ventricle: Left ventricular ejection fraction, by estimation, is 55 to 60%. The left ventricle has normal function. The left ventricle has no regional wall motion abnormalities. The average  left ventricular global longitudinal strain is -16.5 %.  The global longitudinal strain is normal. The left ventricular internal cavity size was normal in size. There is mild left ventricular hypertrophy. Left ventricular diastolic parameters are consistent with Grade II diastolic dysfunction  (pseudonormalization).  Right Ventricle: The right ventricular size is normal. Mildly increased right ventricular wall thickness. Right ventricular systolic function is normal.  Left Atrium: Left atrial size was moderately dilated.  Right Atrium: Right atrial size was normal in size.  Pericardium: There is no evidence of pericardial effusion.  Mitral Valve: The mitral valve is degenerative in appearance. There is moderate calcification of the mitral valve leaflet(s). Moderate mitral valve regurgitation.  Tricuspid Valve: The tricuspid valve is grossly normal. Tricuspid valve regurgitation is not demonstrated.  Aortic Valve: The aortic valve has been repaired/replaced. Aortic valve regurgitation is not visualized. Aortic valve mean gradient measures 7.5 mmHg. Aortic valve peak gradient measures 14.1 mmHg. There is a bioprosthetic valve present in the aortic  position.  Pulmonic Valve: The pulmonic valve was normal in structure. Pulmonic valve regurgitation is not visualized.  Aorta: Aortic dilatation noted. There is mild dilatation of the ascending aorta, measuring 43 mm.  Venous: The inferior vena cava is normal in size with greater than 50% respiratory variability, suggesting right atrial pressure of 3 mmHg.  IAS/Shunts: The interatrial septum was not well visualized.    LEFT VENTRICLE PLAX 2D LVIDd:         3.80 cm Diastology LVIDs:         2.40 cm LV e' medial:    4.57 cm/s LV PW:         1.30 cm LV E/e' medial:  21.3 LV IVS:        1.30 cm LV e' lateral:   9.14 cm/s                        LV E/e' lateral: 10.7                          2D Longitudinal Strain                        2D  Strain GLS Avg:     -16.5 %  RIGHT VENTRICLE RV S prime:     11.90 cm/s TAPSE (M-mode): 1.2 cm  LEFT ATRIUM             Index LA diam:        3.90 cm 2.41 cm/m LA Vol (A2C):   48.9 ml 30.25 ml/m LA Vol (A4C):   68.7 ml 42.51 ml/m LA Biplane Vol: 58.9 ml 36.44 ml/m  AORTIC VALVE AV Vmax:           188.00 cm/s AV Vmean:          126.000 cm/s AV VTI:            0.398 m AV Peak Grad:      14.1 mmHg AV Mean Grad:      7.5 mmHg LVOT Vmax:         114.50 cm/s LVOT Vmean:        74.950 cm/s LVOT VTI:  0.238 m LVOT/AV VTI ratio: 0.60   AORTA Ao Asc diam: 4.30 cm  MITRAL VALVE MV Area (PHT): 2.76 cm     SHUNTS MV Decel Time: 275 msec     Systemic VTI: 0.24 m MV E velocity: 97.50 cm/s MV A velocity: 110.00 cm/s MV E/A ratio:  0.89  Constancia Delton MD Electronically signed by Constancia Delton MD Signature Date/Time: 10/09/2022/8:28:59 AM      Final      Past Medical History:  Diagnosis Date   Aortic stenosis    a. 05/2020 Echo: EF 70-75%, GrI DD, mod-sev AS; b. 08/2020 Cath: AS peak grad w/ AvA 1.00 cm^2; c. 11/2020 SAVR: s/p 21mm Edwards INSPIRIS RESILIA pericardial valve.   Asthma    as a child, no problems as an adult, no inhalers   CAD (coronary artery disease)    a. 08/2020 Cath: LM nl, LAD 60p, 34m, LCX 30ost/p, 65m, RCA 161m w/ L->R collats filling RPDA from LAD; b. 11/2020 CABG x 2 (w/ AVR): VG->OM, VG->RPDA.   Dyspnea    with exertion   GERD (gastroesophageal reflux disease)    Heart murmur    Hyperlipidemia    Hypertension    Obesity    Pre-diabetes    Rheumatoid arthritis (HCC)    Seasonal allergies     Past Surgical History:  Procedure Laterality Date   AORTIC VALVE REPLACEMENT N/A 11/11/2020   Procedure: AORTIC VALVE REPLACEMENT (AVR) WITH INSPIRIS RESILIA AORTIC VALVE SIZE ;  Surgeon: Bartley Lightning, MD;  Location: [facility] OR;  Service: Open Heart Surgery;  Laterality: N/A;   CORONARY ARTERY BYPASS GRAFT N/A 11/11/2020    Procedure: CORONARY ARTERY BYPASS GRAFTING (CABG) X  2 ON PUMP USING RIGHT ENDOSCOPIC GREATER SAPHENOUS VEIN CONDUITS;  Surgeon: Bartley Lightning, MD;  Location: [facility] OR;  Service: Open Heart Surgery;  Laterality: N/A;   ENDOVEIN HARVEST OF GREATER SAPHENOUS VEIN Right 11/11/2020   Procedure: ENDOVEIN HARVEST OF GREATER SAPHENOUS VEIN;  Surgeon: Bartley Lightning, MD;  Location: [facility] OR;  Service: Open Heart Surgery;  Laterality: Right;   RIGHT/LEFT HEART CATH AND CORONARY ANGIOGRAPHY N/A 08/10/2020   Procedure: RIGHT/LEFT HEART CATH AND CORONARY ANGIOGRAPHY;  Surgeon: Wenona Hamilton, MD;  Location: [facility] INVASIVE CV LAB;  Service: Cardiovascular;  Laterality: N/A;   TEE WITHOUT CARDIOVERSION N/A 11/11/2020   Procedure: TRANSESOPHAGEAL ECHOCARDIOGRAM (TEE);  Surgeon: Bartley Lightning, MD;  Location[facility];  Service: Open Heart Surgery;  Laterality: N/A;    Family History  Problem Relation Age of Onset   Stroke Mother    Stroke Father    Heart attack Father     Prior to Admission medications   Medication Sig Start Date End Date Taking? Authorizing Provider  acetaminophen  (TYLENOL ) 500 MG tablet Take 1,000 mg by mouth every 6 (six) hours as needed for moderate pain.   Yes [provider]  alendronate (FOSAMAX) 70 MG tablet Take 70 mg by mouth once a week. Monday 06/22/21  Yes [provider]  aspirin  81 MG EC tablet Take 1 tablet (81 mg total) by mouth daily. 02/03/21  Yes Constancia Delton, MD  atorvastatin  (LIPITOR ) 80 MG tablet Take 1 tablet (80 mg total) by mouth daily. 02/27/23 02/22/24 Yes Agbor-Etang, Polly Brink, MD  Calcium  Carb-Cholecalciferol (CALCIUM  600+D3 PO) Take 1 tablet by mouth in the morning.   Yes [provider]  Cholecalciferol (VITAMIN D3) 125 MCG (5000 UT) TABS Take 5,000 Units by mouth.   Yes [provider]  ezetimibe  (ZETIA ) 10 MG tablet Take 1 tablet (10 mg total) by mouth daily. 09/11/22  Yes Agbor-Etang, Polly Brink, MD  folic acid  (FOLVITE ) 1 MG tablet  Take 1 mg by mouth in the morning. 12/24/19  Yes [provider]  hydroxychloroquine  (PLAQUENIL ) 200 MG tablet Take 400 mg by mouth in the morning.   Yes [provider]  loratadine  (CLARITIN ) 10 MG tablet Take 10 mg by mouth in the morning.   Yes [provider]  metFORMIN (GLUCOPHAGE-XR) 500 MG 24 hr tablet Take 500 mg by mouth daily with breakfast.   Yes [provider]  methotrexate (RHEUMATREX) 2.5 MG tablet Take 10 mg by mouth once a week. Mondays 06/01/21  Yes [provider]  metoprolol  tartrate (LOPRESSOR ) 25 MG tablet Take 0.5 tablets (12.5 mg total) by mouth 2 (two) times daily. 09/11/22  Yes Agbor-Etang, Polly Brink, MD  Omega-3 1000 MG CAPS Take 1,000 mg by mouth in the morning.   Yes [provider]  omeprazole (PRILOSEC) 20 MG capsule Take 20 mg by mouth in the morning. 02/12/20  Yes [provider]  OTEZLA  30 MG TABS Take 30 mg by mouth 2 (two) times daily. 05/10/21  Yes [provider]  ferrous sulfate  325 (65 FE) MG tablet Take 1 tablet (325 mg total) by mouth 2 (two) times daily with a meal. Patient not taking: Reported on 08/03/2021 11/16/20   Gold, Wayne E, PA-C    Current Facility-Administered Medications  Medication Dose Route Frequency Provider Last Rate Last Admin   acetaminophen  (TYLENOL ) tablet 650 mg  650 mg Oral Q6H PRN Atway, Rayann N, DO       Or   acetaminophen  (TYLENOL ) suppository 650 mg  650 mg Rectal Q6H PRN Atway, Rayann N, DO       Apremilast  TABS 30 mg  30 mg Oral BID Atway, Rayann N, DO   30 mg at 05/03/23 2210   atorvastatin  (LIPITOR ) tablet 80 mg  80 mg Oral Daily Atway, Rayann N, DO       ezetimibe  (ZETIA ) tablet 10 mg  10 mg Oral Daily Atway, Rayann N, DO       folic acid  (FOLVITE ) tablet 1 mg  1 mg Oral q AM Atway, Rayann N, DO       hydroxychloroquine  (PLAQUENIL ) tablet 400 mg  400 mg Oral q AM Atway, Rayann N, DO       iron sucrose (VENOFER) 500 mg in sodium chloride  0.9 % 250 mL IVPB  500  mg Intravenous Once Priscella Brooms, DO 67 mL/hr at 05/04/23 1034 500 mg at 05/04/23 1034   metoprolol  tartrate (LOPRESSOR ) tablet 12.5 mg  12.5 mg Oral BID Atway, Rayann N, DO   12.5 mg at 05/03/23 2210   pantoprazole  (PROTONIX ) injection 40 mg  40 mg Intravenous Q12H Atway, Rayann N, DO   40 mg at 05/03/23 2210    Allergies as of 05/03/2023 - Review Complete 05/03/2023  Allergen Reaction Noted   Penicillin g Other (See Comments) 06/01/2021   Penicillin g sodium Other (See Comments) 06/01/2021   Penicillins Rash 07/05/2020    Social History   Socioeconomic History   Marital status: Widowed    Spouse name: Not on file   Number of children: Not on file   Years of education: Not on file   Highest education level: Not on file  Occupational History   Not on file  Tobacco Use   Smoking status: Never    Passive exposure: Never  Smokeless tobacco: Never  Vaping Use   Vaping status: Never Used  Substance and Sexual Activity   Alcohol use: Never   Drug use: Never   Sexual activity: Not on file  Other Topics Concern   Not on file  Social History Narrative   Not on file   Social Drivers of Health   Financial Resource Strain: Not on file  Food Insecurity: No Food Insecurity (05/03/2023)   Hunger Vital Sign    Worried About Running Out of Food in the Last Year: Never true    Ran Out of Food in the Last Year: Never true  Transportation Needs: No Transportation Needs (05/03/2023)   PRAPARE - Administrator, Civil Service (Medical): No    Lack of Transportation (Non-Medical): No  Physical Activity: Not on file  Stress: Not on file  Social Connections: Not on file  Intimate Partner Violence: Not At Risk (05/03/2023)   Humiliation, Afraid, Rape, and Kick questionnaire    Fear of Current or Ex-Partner: No    Emotionally Abused: No    Physically Abused: No    Sexually Abused: No     Code Status   Code Status: Full Code  Review of Systems: All systems reviewed and  negative except where noted in HPI.  Physical Exam: Vital signs in last 24 hours: Temp:  [98.2 F (36.8 C)-99.1 F (37.3 C)] 99 F (37.2 C) (05/03 1047) Pulse Rate:  [62-90] 64 (05/03 1047) Resp:  [16-21] 16 (05/03 1047) BP: (125-146)/(59-76) 146/61 (05/03 1047) SpO2:  [98 %-100 %] 98 % (05/03 1047) Last BM Date : 05/02/23  General:  Pleasant female in NAD Psych:  Cooperative. Normal mood and affect Eyes: Pupils equal Ears:  Normal auditory acuity Nose: No deformity, discharge or lesions Neck:  Supple, no masses felt Lungs:  Clear to auscultation.  Heart:  Regular rate, regular rhythm.  Abdomen:  Soft, nondistended, nontender, active bowel sounds, no masses felt Rectal :  Deferred Msk: Symmetrical without gross deformities.  Neurologic:  Alert, oriented, grossly normal neurologically Extremities : No edema Skin:  Intact without significant lesions.    Intake/Output from previous day: 05/02 0701 - 05/03 0700 In: 512 [P.O.:300; Blood:212] Out: -  Intake/Output this shift:  No intake/output data recorded.   Mai Schwalbe, NP-C   05/04/2023, 11:50 AM

## 2023-05-05 ENCOUNTER — Observation Stay (HOSPITAL_COMMUNITY)

## 2023-05-05 ENCOUNTER — Observation Stay (HOSPITAL_COMMUNITY): Admitting: Anesthesiology

## 2023-05-05 ENCOUNTER — Encounter (HOSPITAL_COMMUNITY)
Admission: EM | Disposition: A | Discharge status: Home / Self Care | Payer: Self-pay | Source: Home / Self Care | Attending: Emergency Medicine

## 2023-05-05 ENCOUNTER — Encounter (HOSPITAL_COMMUNITY): Payer: Self-pay | Admitting: Internal Medicine

## 2023-05-05 DIAGNOSIS — I2510 Atherosclerotic heart disease of native coronary artery without angina pectoris: Secondary | ICD-10-CM

## 2023-05-05 DIAGNOSIS — D123 Benign neoplasm of transverse colon: Secondary | ICD-10-CM | POA: Diagnosis not present

## 2023-05-05 DIAGNOSIS — K2980 Duodenitis without bleeding: Secondary | ICD-10-CM | POA: Diagnosis not present

## 2023-05-05 DIAGNOSIS — K222 Esophageal obstruction: Secondary | ICD-10-CM

## 2023-05-05 DIAGNOSIS — K635 Polyp of colon: Secondary | ICD-10-CM

## 2023-05-05 DIAGNOSIS — K5730 Diverticulosis of large intestine without perforation or abscess without bleeding: Secondary | ICD-10-CM | POA: Diagnosis not present

## 2023-05-05 DIAGNOSIS — K449 Diaphragmatic hernia without obstruction or gangrene: Secondary | ICD-10-CM | POA: Diagnosis not present

## 2023-05-05 DIAGNOSIS — D122 Benign neoplasm of ascending colon: Secondary | ICD-10-CM | POA: Diagnosis not present

## 2023-05-05 DIAGNOSIS — D125 Benign neoplasm of sigmoid colon: Secondary | ICD-10-CM

## 2023-05-05 DIAGNOSIS — D509 Iron deficiency anemia, unspecified: Secondary | ICD-10-CM | POA: Diagnosis not present

## 2023-05-05 DIAGNOSIS — K319 Disease of stomach and duodenum, unspecified: Secondary | ICD-10-CM

## 2023-05-05 DIAGNOSIS — I10 Essential (primary) hypertension: Secondary | ICD-10-CM

## 2023-05-05 DIAGNOSIS — D120 Benign neoplasm of cecum: Secondary | ICD-10-CM

## 2023-05-05 DIAGNOSIS — C170 Malignant neoplasm of duodenum: Secondary | ICD-10-CM | POA: Diagnosis not present

## 2023-05-05 DIAGNOSIS — R600 Localized edema: Secondary | ICD-10-CM | POA: Diagnosis not present

## 2023-05-05 DIAGNOSIS — K3189 Other diseases of stomach and duodenum: Secondary | ICD-10-CM

## 2023-05-05 DIAGNOSIS — D649 Anemia, unspecified: Secondary | ICD-10-CM | POA: Diagnosis not present

## 2023-05-05 DIAGNOSIS — K219 Gastro-esophageal reflux disease without esophagitis: Secondary | ICD-10-CM | POA: Diagnosis not present

## 2023-05-05 DIAGNOSIS — R195 Other fecal abnormalities: Secondary | ICD-10-CM | POA: Diagnosis not present

## 2023-05-05 HISTORY — PX: ESOPHAGOGASTRODUODENOSCOPY: SHX5428

## 2023-05-05 HISTORY — PX: POLYPECTOMY: SHX149

## 2023-05-05 HISTORY — PX: COLONOSCOPY: SHX5424

## 2023-05-05 HISTORY — PX: BONE BIOPSY: SHX375

## 2023-05-05 LAB — CBC
HCT: 30 % — ABNORMAL LOW (ref 36.0–46.0)
Hemoglobin: 8.8 g/dL — ABNORMAL LOW (ref 12.0–15.0)
MCH: 22.1 pg — ABNORMAL LOW (ref 26.0–34.0)
MCHC: 29.3 g/dL — ABNORMAL LOW (ref 30.0–36.0)
MCV: 75.2 fL — ABNORMAL LOW (ref 80.0–100.0)
Platelets: 327 10*3/uL (ref 150–400)
RBC: 3.99 MIL/uL (ref 3.87–5.11)
RDW: 20.2 % — ABNORMAL HIGH (ref 11.5–15.5)
WBC: 10.4 10*3/uL (ref 4.0–10.5)
nRBC: 0.5 % — ABNORMAL HIGH (ref 0.0–0.2)

## 2023-05-05 LAB — COMPREHENSIVE METABOLIC PANEL WITH GFR
ALT: 23 U/L (ref 0–44)
AST: 19 U/L (ref 15–41)
Albumin: 3.1 g/dL — ABNORMAL LOW (ref 3.5–5.0)
Alkaline Phosphatase: 40 U/L (ref 38–126)
Anion gap: 10 (ref 5–15)
BUN: 9 mg/dL (ref 8–23)
CO2: 23 mmol/L (ref 22–32)
Calcium: 8.7 mg/dL — ABNORMAL LOW (ref 8.9–10.3)
Chloride: 111 mmol/L (ref 98–111)
Creatinine, Ser: 0.67 mg/dL (ref 0.44–1.00)
GFR, Estimated: >60 mL/min (ref >60)
Glucose, Bld: 124 mg/dL — ABNORMAL HIGH (ref 70–99)
Potassium: 3.5 mmol/L (ref 3.5–5.1)
Sodium: 144 mmol/L (ref 135–145)
Total Bilirubin: 0.9 mg/dL (ref 0.0–1.2)
Total Protein: 6.1 g/dL — ABNORMAL LOW (ref 6.5–8.1)

## 2023-05-05 SURGERY — COLONOSCOPY
Anesthesia: Monitor Anesthesia Care

## 2023-05-05 MED ORDER — OXYCODONE HCL 5 MG PO TABS: 5.0000 mg | ORAL_TABLET | Freq: Once | ORAL | Status: DC | PRN

## 2023-05-05 MED ORDER — PROPOFOL 10 MG/ML IV BOLUS
INTRAVENOUS | Status: DC | PRN
  Administered 2023-05-05 (×2): 10 mg via INTRAVENOUS
  Administered 2023-05-05: 20 mg via INTRAVENOUS

## 2023-05-05 MED ORDER — LIDOCAINE 2% (20 MG/ML) 5 ML SYRINGE
INTRAMUSCULAR | Status: DC | PRN
  Administered 2023-05-05: 40 mg via INTRAVENOUS

## 2023-05-05 MED ORDER — OXYCODONE HCL 5 MG/5ML PO SOLN: 5.0000 mg | Freq: Once | ORAL | Status: DC | PRN

## 2023-05-05 MED ORDER — PANTOPRAZOLE SODIUM 40 MG PO TBEC
40.0000 mg | DELAYED_RELEASE_TABLET | Freq: Every day | ORAL | Status: DC
  Administered 2023-05-05: 40 mg via ORAL
  Filled 2023-05-05: qty 1

## 2023-05-05 MED ORDER — IOHEXOL 350 MG/ML SOLN
75.0000 mL | Freq: Once | INTRAVENOUS | Status: AC | PRN
  Administered 2023-05-05: 75 mL via INTRAVENOUS

## 2023-05-05 MED ORDER — PHENYLEPHRINE 80 MCG/ML (10ML) SYRINGE FOR IV PUSH (FOR BLOOD PRESSURE SUPPORT)
PREFILLED_SYRINGE | INTRAVENOUS | Status: DC | PRN
  Administered 2023-05-05 (×3): 80 ug via INTRAVENOUS

## 2023-05-05 MED ORDER — PROPOFOL 500 MG/50ML IV EMUL
INTRAVENOUS | Status: DC | PRN
  Administered 2023-05-05: 180 ug/kg/min via INTRAVENOUS

## 2023-05-05 MED ORDER — PANTOPRAZOLE SODIUM 40 MG PO TBEC
40.0000 mg | DELAYED_RELEASE_TABLET | Freq: Every day | ORAL | 0 refills | Status: DC
Start: 2023-05-05 — End: 2023-06-10

## 2023-05-05 MED ORDER — ONDANSETRON HCL 4 MG/2ML IJ SOLN: 4.0000 mg | Freq: Four times a day (QID) | INTRAMUSCULAR | Status: DC | PRN

## 2023-05-05 MED ORDER — SODIUM CHLORIDE 0.9 % IV SOLN
INTRAVENOUS | Status: DC | PRN
Start: 2023-05-05 — End: 2023-05-05

## 2023-05-05 MED ORDER — FENTANYL CITRATE (PF) 100 MCG/2ML IJ SOLN: 25.0000 ug | INTRAMUSCULAR | Status: DC | PRN

## 2023-05-05 NOTE — Hospital Course (Addendum)
 Symptomatic Anemia  Patient presented with recent increased dyspnea on exertion on a trip to [place]  and went into PCP for evaluation where she was found to have hemoglobin of 6.4. She has had somewhat chronic melena which she has never reported to physicians. She did have a normal colon cancer screening with Cologuard previously. She received 1u PRBC and IV iron and felt improved symptomatically. EGD/Colonscopy done while hospitalized and showed a submucosal mass type lesion in the distal first portion of duodenum that may have been intermittently bleeding but was not actively bleeding. Thought that it could be a GIST, thus CT abd/pelvis was obtained which showed slightly thickened mucosa in the duodenal bulb but a discrete measurable mass was not identified. In addition, no findings of lower pulmonary or abd/pelvic metastatic disease was seen. The patient will need outpatient follow up with [facility] and may need EUS depending on pathology results.   HTN  On metoprolol  12.5 mg bid.   PAD RLE edema DVT ruled out with ultrasound.   CAD s/p CABG  Cw lipitor  80mg  daily amd zetia  10mg  daily. Held aspirin  during admission bu resumed at discharge.   T2DM  Takes metformin 500mg  BID. A1c 6.2%   RA  Psoriasis  Hydroxychloroquine  400mg  at bedtime, methotrexate 2.5mg  once a week and folic acid  1 mg daily. Otezla /Apremilast  30 mg BID.

## 2023-05-05 NOTE — Procedures (Addendum)
 I spoke to the patient in PACU and then her daughter Shelley Berg by phone. We went over all the findings and next steps including CT scan abdomen pelvis for anticipated discharge.  Follow-up biopsy results and the possible need for EUS of the duodenal submucosal lesion/mass.  Time provided for questions and answers and she thanked me for the call

## 2023-05-05 NOTE — Interval H&P Note (Signed)
 History and Physical Interval Note: For upper endoscopy and colonoscopy today to evaluate significant iron deficiency anemia and heme positive stool The nature of the procedure, as well as the risks, benefits, and alternatives were carefully and thoroughly reviewed with the patient. Ample time for discussion and questions allowed. The patient understood, was satisfied, and agreed to proceed.      Latest Ref Rng & Units 05/05/2023    5:02 AM 05/04/2023    5:11 PM 05/04/2023    5:55 AM  CBC  WBC 4.0 - 10.5 K/uL 10.4  15.1  9.0   Hemoglobin 12.0 - 15.0 g/dL 8.8  8.5  7.6   Hematocrit 36.0 - 46.0 % 30.0  28.7  24.8   Platelets 150 - 400 K/uL 327  353  308      05/05/2023 7:22 AM  Shelley Berg  has presented today for surgery, with the diagnosis of Iron deficiency anemia, FOBT positive stool.  The various methods of treatment have been discussed with the patient and family. After consideration of risks, benefits and other options for treatment, the patient has consented to  Procedure(s): COLONOSCOPY (N/A) EGD (ESOPHAGOGASTRODUODENOSCOPY) (N/A) as a surgical intervention.  The patient's history has been reviewed, patient examined, no change in status, stable for surgery.  I have reviewed the patient's chart and labs.  Questions were answered to the patient's satisfaction.     Amber Bail Levis Nazir

## 2023-05-05 NOTE — Plan of Care (Signed)

## 2023-05-05 NOTE — Op Note (Signed)
 [facility] Patient Name: Shelley Berg Procedure Date : 05/05/2023 MRN: [id number] Attending MD: Nannette Babe , MD, [id number] Date of Birth: [birth year] CSN: [id number] Age: 78 Admit Type: Inpatient Procedure:                Upper GI endoscopy Indications:              Iron deficiency anemia, Heme positive stool Providers:                Amber Bail. Bridgett Camps, MD, Perla Bradford, RN, Nohemi Batters,                            Technician Referring MD:             [facility] Medicines:                Monitored Anesthesia Care Complications:            No immediate complications. Estimated Blood Loss:     Estimated blood loss was minimal. Procedure:                Pre-Anesthesia Assessment:                           - Prior to the procedure, a History and Physical                            was performed, and patient medications and                            allergies were reviewed. The patient's tolerance of                            previous anesthesia was also reviewed. The risks                            and benefits of the procedure and the sedation                            options and risks were discussed with the patient.                            All questions were answered, and informed consent                            was obtained. Prior Anticoagulants: The patient has                            taken no anticoagulant or antiplatelet agents. ASA                            Grade Assessment: III - A patient with severe                            systemic disease. After reviewing the risks and  benefits, the patient was deemed in satisfactory                            condition to undergo the procedure.                           After obtaining informed consent, the endoscope was                            passed under direct vision. Throughout the                            procedure, the patient's blood pressure, pulse, and                             oxygen saturations were monitored continuously. The                            GIF-H190 ([id number]) Olympus endoscope was introduced                            through the mouth, and advanced to the second part                            of duodenum. The upper GI endoscopy was                            accomplished without difficulty. The patient                            tolerated the procedure well. Scope In: Scope Out: Findings:      A low-grade of narrowing Schatzki ring was found at the gastroesophageal       junction.      A 3 cm hiatal hernia was present.      The gastroesophageal flap valve was visualized endoscopically and       classified as Hill Grade IV (no fold, wide open lumen, hiatal hernia       present).      The entire examined stomach was normal. Biopsies were taken with a cold       forceps for Helicobacter pylori testing.      A medium-sized (3 cm approx.) submucosal mass with no overt bleeding was       found in the distal duodenal bulb. Biopsies were taken with a cold       forceps for histology.      The second portion of the duodenum was normal. Biopsies for histology       were taken with a cold forceps for evaluation of celiac disease. Impression:               - Low-grade of narrowing Schatzki ring.                           - 3 cm hiatal hernia.                           -  Gastroesophageal flap valve classified as Hill                            Grade IV (no fold, wide open lumen, hiatal hernia                            present).                           - Normal stomach. Biopsied.                           - Duodenal submucosal mass. Semi-pedunculated. Not                            overtly bleeding. Biopsied.                           - Normal second portion of the duodenum. Biopsied. Moderate Sedation:      N/A Recommendation:           - Return patient to hospital ward for ongoing care.                           - Resume  previous diet.                           - Continue present medications.                           - Daily PPI.                           - Await pathology results.                           - CT abd/pelvis with IV contrast. Attention to                            proximal duodenum.                           - Probable need for EUS, depending on biopsy                            results.                           - See the other procedure note for documentation of                            additional recommendations. Procedure Code(s):        --- Professional ---                           [phone], Esophagogastroduodenoscopy, flexible,  transoral; with biopsy, single or multiple Diagnosis Code(s):        --- Professional ---                           K22.2, Esophageal obstruction                           K44.9, Diaphragmatic hernia without obstruction or                            gangrene                           K31.89, Other diseases of stomach and duodenum                           D50.9, Iron deficiency anemia, unspecified                           R19.5, Other fecal abnormalities CPT copyright 2022 American Medical Association. All rights reserved. The codes documented in this report are preliminary and upon coder review may  be revised to meet current compliance requirements. Nannette Babe, MD 05/05/2023 7:57:49 AM This report has been signed electronically. Number of Addenda: 0

## 2023-05-05 NOTE — Discharge Summary (Signed)
 Name: Shelley Berg MRN: [id number] DOB: 02-Jun-1945 78 y.o. PCP: Shelley Barters, MD  Date of Admission: 05/03/2023  2:35 PM Date of Discharge:  05/05/2023 Attending Physician: Dr. Bettejane Berg  DISCHARGE DIAGNOSIS:  Primary Problem: Symptomatic anemia   Hospital Problems: Principal Problem:   Symptomatic anemia Active Problems:   Iron deficiency anemia   Heme positive stool   Mass of duodenum   Benign neoplasm of cecum   Benign neoplasm of ascending colon   Benign neoplasm of transverse colon   Benign neoplasm of sigmoid colon    DISCHARGE MEDICATIONS:   Allergies as of 05/05/2023       Reactions   Penicillin G Other (See Comments)   Penicillin G Sodium Other (See Comments)   Penicillins Rash        Medication List     STOP taking these medications    omeprazole 20 MG capsule Commonly known as: PRILOSEC       TAKE these medications    acetaminophen  500 MG tablet Commonly known as: TYLENOL  Take 1,000 mg by mouth every 6 (six) hours as needed for moderate pain.   alendronate 70 MG tablet Commonly known as: FOSAMAX Take 70 mg by mouth once a week. Monday   aspirin  EC 81 MG tablet Take 1 tablet (81 mg total) by mouth daily.   atorvastatin  80 MG tablet Commonly known as: LIPITOR  Take 1 tablet (80 mg total) by mouth daily.   CALCIUM  600+D3 PO Take 1 tablet by mouth in the morning.   ezetimibe  10 MG tablet Commonly known as: ZETIA  Take 1 tablet (10 mg total) by mouth daily.   ferrous sulfate  325 (65 FE) MG tablet Take 1 tablet (325 mg total) by mouth 2 (two) times daily with a meal.   folic acid  1 MG tablet Commonly known as: FOLVITE  Take 1 mg by mouth in the morning.   hydroxychloroquine  200 MG tablet Commonly known as: PLAQUENIL  Take 400 mg by mouth in the morning.   loratadine  10 MG tablet Commonly known as: CLARITIN  Take 10 mg by mouth in the morning.   metFORMIN 500 MG 24 hr tablet Commonly known as: GLUCOPHAGE-XR Take 500 mg by  mouth daily with breakfast.   methotrexate 2.5 MG tablet Commonly known as: RHEUMATREX Take 10 mg by mouth once a week. Mondays   metoprolol  tartrate 25 MG tablet Commonly known as: LOPRESSOR  Take 0.5 tablets (12.5 mg total) by mouth 2 (two) times daily.   Omega-3 1000 MG Caps Take 1,000 mg by mouth in the morning.   Otezla  30 MG Tabs Generic drug: Apremilast  Take 30 mg by mouth 2 (two) times daily.   pantoprazole  40 MG tablet Commonly known as: PROTONIX  Take 1 tablet (40 mg total) by mouth daily before breakfast.   Vitamin D3 125 MCG (5000 UT) Tabs Take 5,000 Units by mouth.        DISPOSITION AND FOLLOW-UP:  ShelleyShelley Berg was discharged from [facility] in Stable condition. At the hospital follow up visit please address:  Symptomatic anemia: Received 1u PRBC and IV iron with improvement in symptoms. F/u pathology from EGD/Colonoscopy. May need to follow up with GI and consider EUS given submucosal mass that was seen near duodenum  Follow-up Recommendations: Consults: GI Labs: CBC Studies: None, but may need EUS based on EGD pathology Medications: Protonix  40 mg daily  Follow-up Appointments:  Follow-up Information     Hamrick, Orest Bio, MD. Call.   Specialty: Family Medicine Why:  make hospital follow up appt in 1-2 weeks Contact information: [address] [place] [address] [phone]         Pyrtle, Amber Bail, MD Follow up.   Specialty: Gastroenterology Contact information: [address] [place] [address] [phone]                 HOSPITAL COURSE:  Patient Summary: Symptomatic Anemia  Patient presented with recent increased dyspnea on exertion on a trip to [place]  and went into PCP for evaluation where she was found to have hemoglobin of 6.4. She has had somewhat chronic melena which she has never reported to physicians. She did have a normal colon cancer screening with Cologuard previously. She received  1u PRBC and IV iron and felt improved symptomatically. EGD/Colonscopy done while hospitalized and showed a submucosal mass type lesion in the distal first portion of duodenum that may have been intermittently bleeding but was not actively bleeding. Thought that it could be a GIST, thus CT abd/pelvis was obtained which showed slightly thickened mucosa in the duodenal bulb but a discrete measurable mass was not identified. In addition, no findings of lower pulmonary or abd/pelvic metastatic disease was seen. The patient will need outpatient follow up with [facility] and may need EUS depending on pathology results.   HTN  On metoprolol  12.5 mg bid.   PAD RLE edema DVT ruled out with ultrasound.   CAD s/p CABG  Cw lipitor  80mg  daily amd zetia  10mg  daily. Held aspirin  during admission bu resumed at discharge.   T2DM  Takes metformin 500mg  BID. A1c 6.2%   RA  Psoriasis  Hydroxychloroquine  400mg  at bedtime, methotrexate 2.5mg  once a week and folic acid  1 mg daily. Otezla /Apremilast  30 mg BID.   DISCHARGE INSTRUCTIONS:   Discharge Instructions     Call MD for:   Complete by: As directed    Dark, black, tarry stools or bright red blood per rectum   Call MD for:  extreme fatigue   Complete by: As directed    Call MD for:  persistant dizziness or light-headedness   Complete by: As directed    Call MD for:  persistant nausea and vomiting   Complete by: As directed    Call MD for:  temperature >100.4   Complete by: As directed    Diet general   Complete by: As directed    Discharge instructions   Complete by: As directed    Shelley Berg,  You were hospitalized because you were found to be anemic (low blood counts) which is why you felt short of breath. You required a blood transfusion while you were here and also received IV iron to help boost your iron levels.   The colonoscopy you had showed some polyps which were removed - the GI doctor will call you with the results of the  pathology from these. For the endoscopy you had, it did show a mass in part of your small intestine called the duodenum, but it was not actively bleeding. The GI doctor did take a biopsy of this and will call you with those results. You may need another endoscopic procedure (with the ultrasound) to evaluate that mass further like he discussed with you, but he will call you if that is the case. The CT scan of your stomach did not visualize this very well.   Start taking protonix  40 mg daily - this will replace your omeprazole that you were taking, but otherwise, there are no new  medications that you need to start.   Please see your primary care doctor on Thursday. I have also provided you with the [facility] (Dr. Bridgett Camps) phone number, as you likely will need to see them.   Take care, Dr. Verlene Glimpse   Increase activity slowly   Complete by: As directed        SUBJECTIVE:  Shelley Berg was seen after her EGD/colonoscopy. She symptomatically feels that her breathing is doing better and she was seen ambulating in her room. She has no acute concerns and would like to go home.   Discharge Vitals:   BP (!) 128/55 (BP Location: Right Arm)   Pulse 65   Temp 98.1 F (36.7 C) (Oral)   Resp 20   Ht 4\' 11"  (1.499 m)   Wt 68 kg   SpO2 100%   BMI 30.30 kg/m   OBJECTIVE:  General: Pleasant, well-appearing female walking in room. NAD CV: RRR. Systolic murmur Pulmonary: Normal work of breathing on room air Abdominal: Soft, nontender, nondistended abdomen Skin: Warm and dry.  Neuro: A&Ox3. Moves all extremities. No focal deficit. Psych: Normal mood and affect    Pertinent Labs, Studies, and Procedures:     Latest Ref Rng & Units 05/05/2023    5:02 AM 05/04/2023    5:11 PM 05/04/2023    5:55 AM  CBC  WBC 4.0 - 10.5 K/uL 10.4  15.1  9.0   Hemoglobin 12.0 - 15.0 g/dL 8.8  8.5  7.6   Hematocrit 36.0 - 46.0 % 30.0  28.7  24.8   Platelets 150 - 400 K/uL 327  353  308        Latest Ref Rng  & Units 05/05/2023    5:02 AM 05/04/2023    5:55 AM 05/03/2023    3:05 PM  CMP  Glucose 70 - 99 mg/dL 562  130  865   BUN 8 - 23 mg/dL 9  9  8    Creatinine 0.44 - 1.00 mg/dL 7.84  6.96  2.95   Sodium 135 - 145 mmol/L 144  139  140   Potassium 3.5 - 5.1 mmol/L 3.5  3.7  4.4   Chloride 98 - 111 mmol/L 111  109  109   CO2 22 - 32 mmol/L 23  21  21    Calcium  8.9 - 10.3 mg/dL 8.7  8.6  9.2   Total Protein 6.5 - 8.1 g/dL 6.1   5.7   Total Bilirubin 0.0 - 1.2 mg/dL 0.9   0.5   Alkaline Phos 38 - 126 U/L 40   42   AST 15 - 41 U/L 19   26   ALT 0 - 44 U/L 23   28     VAS US  LOWER EXTREMITY VENOUS (DVT) (7a-7p) Result Date: 05/04/2023  Lower Venous DVT Study Patient Name:  EDMEE STONEY  Date of Exam:   05/04/2023 Medical Rec #: [id number]          Accession #:    [id number] Date of Birth: 14-Sep-1945           Patient Gender: F Patient Age:   24 years Exam Location:  [facility] Procedure:      VAS US  LOWER EXTREMITY VENOUS (DVT) Referring Phys: MELANIE BELFI --------------------------------------------------------------------------------  Indications: Swelling.  Risk Factors: Hx of RLE GSV harvest 2022 (CABG), chronic swelling. Limitations: Poor ultrasound/tissue interface. Comparison Study: No previous exams Performing Technologist: Jody Hill RVT, RDMS  Examination Guidelines: A complete evaluation  includes B-mode imaging, spectral Doppler, color Doppler, and power Doppler as needed of all accessible portions of each vessel. Bilateral testing is considered an integral part of a complete examination. Limited examinations for reoccurring indications may be performed as noted. The reflux portion of the exam is performed with the patient in reverse Trendelenburg.  +---------+---------------+---------+-----------+----------+--------------+ RIGHT    CompressibilityPhasicitySpontaneityPropertiesThrombus Aging +---------+---------------+---------+-----------+----------+--------------+ CFV      Full            Yes      Yes                                 +---------+---------------+---------+-----------+----------+--------------+ SFJ      Full                                                        +---------+---------------+---------+-----------+----------+--------------+ FV Prox  Full           Yes      Yes                                 +---------+---------------+---------+-----------+----------+--------------+ FV Mid   Full           Yes      Yes                                 +---------+---------------+---------+-----------+----------+--------------+ FV DistalFull           Yes      Yes                                 +---------+---------------+---------+-----------+----------+--------------+ PFV      Full                                                        +---------+---------------+---------+-----------+----------+--------------+ POP      Full           Yes      Yes                                 +---------+---------------+---------+-----------+----------+--------------+ PTV      Full                                                        +---------+---------------+---------+-----------+----------+--------------+ PERO     Full                                                        +---------+---------------+---------+-----------+----------+--------------+   +----+---------------+---------+-----------+----------+--------------+ LEFTCompressibilityPhasicitySpontaneityPropertiesThrombus Aging +----+---------------+---------+-----------+----------+--------------+ CFV Full  Yes      Yes                                 +----+---------------+---------+-----------+----------+--------------+    Summary: RIGHT: - There is no evidence of deep vein thrombosis in the lower extremity.  - No cystic structure found in the popliteal fossa.  LEFT: - No evidence of common femoral vein obstruction.   *See table(s) above for measurements and  observations. Electronically signed by Delaney Fearing on 05/04/2023 at 7:30:56 PM.    Final      Signed: Lorelle Roll, D.O.  Internal Medicine Resident, PGY-3 [facility]  Pager: [phone] 1:40 PM, 05/05/2023

## 2023-05-05 NOTE — Op Note (Signed)
 [facility] Patient Name: Shelley Berg Procedure Date : 05/05/2023 MRN: [id number] Attending MD: Nannette Babe , MD, [id number] Date of Birth: [birth year] CSN: [id number] Age: 78 Admit Type: Inpatient Procedure:                Colonoscopy Indications:              This is the patient's first colonoscopy, Heme                            positive stool, Iron deficiency anemia, previously                            neg Cologuards Providers:                Amber Bail. Bridgett Camps, MD, Perla Bradford, RN, Nohemi Batters,                            Technician Referring MD:             [facility] Medicines:                Monitored Anesthesia Care Complications:            No immediate complications. Estimated Blood Loss:     Estimated blood loss was minimal. Procedure:                Pre-Anesthesia Assessment:                           - Prior to the procedure, a History and Physical                            was performed, and patient medications and                            allergies were reviewed. The patient's tolerance of                            previous anesthesia was also reviewed. The risks                            and benefits of the procedure and the sedation                            options and risks were discussed with the patient.                            All questions were answered, and informed consent                            was obtained. Prior Anticoagulants: The patient has                            taken no anticoagulant or antiplatelet agents. ASA  Grade Assessment: III - A patient with severe                            systemic disease. After reviewing the risks and                            benefits, the patient was deemed in satisfactory                            condition to undergo the procedure.                           After obtaining informed consent, the colonoscope                            was passed  under direct vision. Throughout the                            procedure, the patient's blood pressure, pulse, and                            oxygen saturations were monitored continuously. The                            PCF-190TL ([id number] Olympus colonoscope was                            introduced through the anus and advanced to the                            terminal ileum. The colonoscopy was performed                            without difficulty. The patient tolerated the                            procedure well. The quality of the bowel                            preparation was good. The terminal ileum, ileocecal                            valve, appendiceal orifice, and rectum were                            photographed. Scope In: 7:59:37 AM Scope Out: 8:12:03 AM Total Procedure Duration: 0 hours 12 minutes 26 seconds  Findings:      The digital rectal exam was normal.      The terminal ileum appeared normal.      A 5 mm polyp was found in the cecum. The polyp was sessile. The polyp       was removed with a cold snare. Resection and retrieval were complete.      A 4 mm polyp was found in the ascending colon. The polyp was sessile.  The polyp was removed with a cold snare. Resection and retrieval were       complete.      A 8 mm polyp was found in the transverse colon. The polyp was sessile.       The polyp was removed with a cold snare. Resection and retrieval were       complete.      A 2 mm polyp was found in the sigmoid colon. The polyp was sessile. The       polyp was removed with a cold snare. Resection and retrieval were       complete.      Multiple medium-mouthed and small-mouthed diverticula were found in the       sigmoid colon.      The retroflexed view of the distal rectum and anal verge was normal and       showed no anal or rectal abnormalities. Impression:               - The examined portion of the ileum was normal.                           - One 5  mm polyp in the cecum, removed with a cold                            snare. Resected and retrieved.                           - One 4 mm polyp in the ascending colon, removed                            with a cold snare. Resected and retrieved.                           - One 8 mm polyp in the transverse colon, removed                            with a cold snare. Resected and retrieved.                           - One 2 mm polyp in the sigmoid colon, removed with                            a cold snare. Resected and retrieved.                           - Moderate diverticulosis in the sigmoid colon. Moderate Sedation:      N/A Recommendation:           - Return patient to hospital ward for ongoing care.                           - Advance diet as tolerated.                           - Continue present medications.                           -  Await pathology results.                           - See the other procedure note for documentation of                            additional recommendations. Procedure Code(s):        --- Professional ---                           [phone], Colonoscopy, flexible; with removal of                            tumor(s), polyp(s), or other lesion(s) by snare                            technique Diagnosis Code(s):        --- Professional ---                           D12.0, Benign neoplasm of cecum                           D12.2, Benign neoplasm of ascending colon                           D12.3, Benign neoplasm of transverse colon (hepatic                            flexure or splenic flexure)                           D12.5, Benign neoplasm of sigmoid colon                           R19.5, Other fecal abnormalities                           D50.9, Iron deficiency anemia, unspecified                           K57.30, Diverticulosis of large intestine without                            perforation or abscess without bleeding CPT copyright 2022 American  Medical Association. All rights reserved. The codes documented in this report are preliminary and upon coder review may  be revised to meet current compliance requirements. Nannette Babe, MD 05/05/2023 8:20:14 AM This report has been signed electronically. Number of Addenda: 0

## 2023-05-05 NOTE — Progress Notes (Signed)
 PIV removed without complication, AVS with instructions given to patient and daughter, no further questions at this time verbalized.

## 2023-05-05 NOTE — Transfer of Care (Signed)
 Immediate Anesthesia Transfer of Care Note  Patient: Shelley Berg  Procedure(s) Performed: COLONOSCOPY EGD (ESOPHAGOGASTRODUODENOSCOPY) BIOPSY, GASTRIC POLYPECTOMY, INTESTINE  Patient Location: PACU  Anesthesia Type:MAC  Level of Consciousness: sedated  Airway & Oxygen Therapy: Patient Spontanous Breathing and Patient connected to nasal cannula oxygen  Post-op Assessment: Report given to RN and Post -op Vital signs reviewed and stable  Post vital signs: Reviewed and stable  Last Vitals:  Vitals Value Taken Time  BP    Temp    Pulse 66 05/05/23 0819  Resp 17 05/05/23 0819  SpO2 100 % 05/05/23 0819  Vitals shown include unfiled device data.  Last Pain:  Vitals:   05/05/23 0725  TempSrc: Temporal  PainSc: 0-No pain         Complications: No notable events documented.

## 2023-05-05 NOTE — Anesthesia Preprocedure Evaluation (Signed)
 Anesthesia Evaluation  Patient identified by MRN, date of birth, ID band Patient awake    Reviewed: Allergy & Precautions, H&P , NPO status , Patient's Chart, lab work & pertinent test results  Airway Mallampati: II   Neck ROM: full    Dental   Pulmonary shortness of breath, asthma    breath sounds clear to auscultation       Cardiovascular hypertension, + CAD and + CABG  + Valvular Problems/Murmurs  Rhythm:regular Rate:Normal  S/p AVR and CABG 2022.   Neuro/Psych    GI/Hepatic ,GERD  ,,GI bleeding   Endo/Other    Renal/GU      Musculoskeletal  (+) Arthritis ,    Abdominal   Peds  Hematology  (+) Blood dyscrasia, anemia Hemoglobin 8.8   Anesthesia Other Findings   Reproductive/Obstetrics                             Anesthesia Physical Anesthesia Plan  ASA: 3  Anesthesia Plan: MAC   Post-op Pain Management:    Induction: Intravenous  PONV Risk Score and Plan: 2 and Propofol  infusion and Treatment may vary due to age or medical condition  Airway Management Planned: Nasal Cannula  Additional Equipment:   Intra-op Plan:   Post-operative Plan:   Informed Consent: I have reviewed the patients History and Physical, chart, labs and discussed the procedure including the risks, benefits and alternatives for the proposed anesthesia with the patient or authorized representative who has indicated his/her understanding and acceptance.     Dental advisory given  Plan Discussed with: CRNA, Anesthesiologist and Surgeon  Anesthesia Plan Comments:        Anesthesia Quick Evaluation

## 2023-05-07 ENCOUNTER — Encounter (HOSPITAL_COMMUNITY): Payer: Self-pay | Admitting: Internal Medicine

## 2023-05-07 LAB — SURGICAL PATHOLOGY

## 2023-05-07 NOTE — Anesthesia Postprocedure Evaluation (Signed)
 Anesthesia Post Note  Patient: Shelley Berg  Procedure(Berg) Performed: COLONOSCOPY EGD (ESOPHAGOGASTRODUODENOSCOPY) BIOPSY, GASTRIC POLYPECTOMY, INTESTINE     Patient location during evaluation: Endoscopy Anesthesia Type: MAC Level of consciousness: awake and alert Pain management: pain level controlled Vital Signs Assessment: post-procedure vital signs reviewed and stable Respiratory status: spontaneous breathing, nonlabored ventilation, respiratory function stable and patient connected to nasal cannula oxygen Cardiovascular status: stable and blood pressure returned to baseline Postop Assessment: no apparent nausea or vomiting Anesthetic complications: no   No notable events documented.  Last Vitals:  Vitals:   05/05/23 0845 05/05/23 0934  BP: 131/68 (!) 128/55  Pulse: 69 65  Resp: 20   Temp: (!) 36.3 C 36.7 C  SpO2: 97% 100%    Last Pain:  Vitals:   05/05/23 0934  TempSrc: Oral  PainSc:                  Shelley Berg

## 2023-05-08 ENCOUNTER — Telehealth: Payer: Self-pay | Admitting: Internal Medicine

## 2023-05-08 ENCOUNTER — Other Ambulatory Visit: Payer: Self-pay

## 2023-05-08 DIAGNOSIS — K639 Disease of intestine, unspecified: Secondary | ICD-10-CM

## 2023-05-08 DIAGNOSIS — D509 Iron deficiency anemia, unspecified: Secondary | ICD-10-CM

## 2023-05-08 NOTE — Telephone Encounter (Signed)
 Dr Bridgett Camps, please review 05/05/23 pathology results and advise of recommendations.

## 2023-05-08 NOTE — Telephone Encounter (Signed)
 I have spoken to patient to advise of results and recommendations as per Dr Bridgett Camps. Discuss need for EUS to further characterize small bowel nodule. Patient is in agreement with this and has been made aware that Dr Marolyn Sis nurse will be reaching out to her shortly. Patient has also been advised of recommendation to move forward with capsule endoscopy for further evaluation of IDA. Patient has scheduled capsule on 05/24/23 and detailed prep instructions sent to her via mychart. Patient is advised of adenomatous polyps which would typically call for repeat procedure in 5 years. Discussed that we will place a reminder in our system to further discuss appropriateness of repeat colonoscopy in 5 years.

## 2023-05-08 NOTE — Telephone Encounter (Signed)
 Patient requesting a call to discuss if a follow up visit is needed after 5/4 procedure at [facility]. Please advise, thank you.

## 2023-05-08 NOTE — Telephone Encounter (Signed)
 I was just reviewing the case with Dr. Brice Campi  Please let her know that the biopsies from the stomach, small bowel and the submucosal mass/lesion in the duodenum are benign.  This is good news but I would like to proceed with EUS to see deeper into the nodule in her small bowel.  Dr. Brice Campi is in agreement.  We also should proceed with video capsule endoscopy as an outpatient to examine the remaining small bowel to rule out other causes for low iron anemia.  Dr. Brice Campi can arrange EUS in the outpatient hospital setting We can proceed with video capsule endoscopy  The colon polyps were adenomas, benign but precancerous polyps.  Recall colonoscopy would be 5 years but it would be based on overall health at that time as she will be 78 years old.  We can talk about the risk versus benefits of a repeat colonoscopy at that time.  We will need to pay close attention to her iron studies, she got IV iron while hospitalized Would recommend CBC, IBC plus ferritin in 8 to 12 weeks

## 2023-05-08 NOTE — Telephone Encounter (Signed)
 Dr Brice Campi do you want EUS EMR?

## 2023-05-08 NOTE — Telephone Encounter (Signed)
 EUS has been set up for 06/10/23 at 245 pm at [facility] with GM

## 2023-05-09 DIAGNOSIS — M0600 Rheumatoid arthritis without rheumatoid factor, unspecified site: Secondary | ICD-10-CM | POA: Diagnosis not present

## 2023-05-09 DIAGNOSIS — D500 Iron deficiency anemia secondary to blood loss (chronic): Secondary | ICD-10-CM | POA: Diagnosis not present

## 2023-05-09 DIAGNOSIS — K219 Gastro-esophageal reflux disease without esophagitis: Secondary | ICD-10-CM | POA: Diagnosis not present

## 2023-05-09 DIAGNOSIS — M810 Age-related osteoporosis without current pathological fracture: Secondary | ICD-10-CM | POA: Diagnosis not present

## 2023-05-09 DIAGNOSIS — E785 Hyperlipidemia, unspecified: Secondary | ICD-10-CM | POA: Diagnosis not present

## 2023-05-09 DIAGNOSIS — E1169 Type 2 diabetes mellitus with other specified complication: Secondary | ICD-10-CM | POA: Diagnosis not present

## 2023-05-09 DIAGNOSIS — I10 Essential (primary) hypertension: Secondary | ICD-10-CM | POA: Diagnosis not present

## 2023-05-09 DIAGNOSIS — E782 Mixed hyperlipidemia: Secondary | ICD-10-CM | POA: Diagnosis not present

## 2023-05-09 NOTE — Telephone Encounter (Signed)
Thanks Patty. GM 

## 2023-05-09 NOTE — Telephone Encounter (Signed)
 EUS scheduled, pt instructed and medications reviewed.  Patient instructions mailed to home.  Patient to call with any questions or concerns.

## 2023-05-16 DIAGNOSIS — M25551 Pain in right hip: Secondary | ICD-10-CM | POA: Diagnosis not present

## 2023-05-16 DIAGNOSIS — M0600 Rheumatoid arthritis without rheumatoid factor, unspecified site: Secondary | ICD-10-CM | POA: Diagnosis not present

## 2023-05-16 DIAGNOSIS — Z79899 Other long term (current) drug therapy: Secondary | ICD-10-CM | POA: Diagnosis not present

## 2023-05-16 DIAGNOSIS — M25511 Pain in right shoulder: Secondary | ICD-10-CM | POA: Diagnosis not present

## 2023-05-16 DIAGNOSIS — M25512 Pain in left shoulder: Secondary | ICD-10-CM | POA: Diagnosis not present

## 2023-05-16 DIAGNOSIS — L4050 Arthropathic psoriasis, unspecified: Secondary | ICD-10-CM | POA: Diagnosis not present

## 2023-05-24 ENCOUNTER — Ambulatory Visit: Admitting: Internal Medicine

## 2023-05-24 DIAGNOSIS — R195 Other fecal abnormalities: Secondary | ICD-10-CM | POA: Diagnosis not present

## 2023-05-24 DIAGNOSIS — D509 Iron deficiency anemia, unspecified: Secondary | ICD-10-CM | POA: Diagnosis not present

## 2023-05-24 DIAGNOSIS — K319 Disease of stomach and duodenum, unspecified: Secondary | ICD-10-CM | POA: Diagnosis not present

## 2023-05-24 DIAGNOSIS — K31819 Angiodysplasia of stomach and duodenum without bleeding: Secondary | ICD-10-CM | POA: Diagnosis not present

## 2023-05-24 NOTE — Patient Instructions (Signed)
 You may have clear liquids beginning at 10:30 am after ingesting the capsule.    You can have a light lunch at 12:30 pm; sandwich and half bowl of soup.  Return to the office at 4 pm to return the equipment.   Return to you normal diet at 5 pm.   Call [phone] and ask for Hilma Favors BSN RN if you have any questions.  You should pass the capsule in your stool 8-48 hours after ingestion. If you have not passed the capsule, after 72 hours, please contact the office at [phone].

## 2023-05-24 NOTE — Progress Notes (Signed)
 SN: [id number] Exp: 02/15/2024 LOT: [id number]  Patient arrived for VCE. Reported the prep went well. This RN explained capsule dietary restrictions for the next few hours. Pt advised to return at 4 pm to return capsule equipment.  Patient verbalized understanding. Opened capsule, ensured capsule was flashing prior to the patient swallowing the capsule. Patient swallowed capsule without difficulty.  Patient told to call the office with any questions and if capsule has not passed after 72 hours. No further questions by the conclusion of the visit.

## 2023-06-04 ENCOUNTER — Encounter (HOSPITAL_COMMUNITY): Payer: Self-pay | Admitting: Gastroenterology

## 2023-06-04 ENCOUNTER — Telehealth: Payer: Self-pay

## 2023-06-04 NOTE — Telephone Encounter (Signed)
 Procedure:ultrasound Procedure date: 6/9 Procedure location: wl Arrival Time: 1215pm Spoke with the patient Y/N: y Any prep concerns? n  Has the patient obtained the prep from the pharmacy ? n Do you have a care partner and transportation: y Any additional concerns? n

## 2023-06-04 NOTE — Progress Notes (Signed)
 Attempted to obtain medical history for pre op call via telephone, unable to reach at this time. HIPAA compliant voicemail message left requesting return call to pre surgical testing department.

## 2023-06-05 ENCOUNTER — Telehealth: Payer: Self-pay | Admitting: *Deleted

## 2023-06-05 DIAGNOSIS — K639 Disease of intestine, unspecified: Secondary | ICD-10-CM

## 2023-06-05 DIAGNOSIS — D509 Iron deficiency anemia, unspecified: Secondary | ICD-10-CM

## 2023-06-05 NOTE — Telephone Encounter (Signed)
 Capsule endoscopy report 05/24/23   summary and recommendations:   Complete capsule endoscopy done for iron  deficiency anemia.   Previously noted duodenal submucosal mass lesion is seen and one tiny nonbleeding AVM.  Otherwise negative study.  Repeat CBC.  Patient is scheduled for EUS of duodenal lesion on 06/10/22

## 2023-06-05 NOTE — Telephone Encounter (Signed)
 I have contacted patient to advise of study results and recommendations to keep scheduled EUS. She verbalizes understanding.  Dr Bridgett Camps, when would you like patient to have a repeat CBC?

## 2023-06-06 DIAGNOSIS — Z1231 Encounter for screening mammogram for malignant neoplasm of breast: Secondary | ICD-10-CM | POA: Diagnosis not present

## 2023-06-06 NOTE — Addendum Note (Signed)
 Addended by: Glennette Lanius on: 06/06/2023 08:12 AM   Modules accepted: Orders

## 2023-06-10 ENCOUNTER — Ambulatory Visit (HOSPITAL_COMMUNITY): Admitting: Certified Registered Nurse Anesthetist

## 2023-06-10 ENCOUNTER — Other Ambulatory Visit: Payer: Self-pay

## 2023-06-10 ENCOUNTER — Encounter (HOSPITAL_COMMUNITY)
Admission: RE | Disposition: A | Discharge status: Home / Self Care | Payer: Self-pay | Source: Home / Self Care | Attending: Gastroenterology

## 2023-06-10 ENCOUNTER — Encounter (HOSPITAL_COMMUNITY): Payer: Self-pay | Admitting: Gastroenterology

## 2023-06-10 ENCOUNTER — Ambulatory Visit (HOSPITAL_COMMUNITY)
Admission: RE | Admit: 2023-06-10 | Discharge: 2023-06-10 | Disposition: A | Discharge status: Home / Self Care | Attending: Gastroenterology | Admitting: Gastroenterology

## 2023-06-10 DIAGNOSIS — I10 Essential (primary) hypertension: Secondary | ICD-10-CM | POA: Insufficient documentation

## 2023-06-10 DIAGNOSIS — K449 Diaphragmatic hernia without obstruction or gangrene: Secondary | ICD-10-CM | POA: Diagnosis not present

## 2023-06-10 DIAGNOSIS — K639 Disease of intestine, unspecified: Secondary | ICD-10-CM

## 2023-06-10 DIAGNOSIS — Z79899 Other long term (current) drug therapy: Secondary | ICD-10-CM | POA: Diagnosis not present

## 2023-06-10 DIAGNOSIS — K222 Esophageal obstruction: Secondary | ICD-10-CM

## 2023-06-10 DIAGNOSIS — K56699 Other intestinal obstruction unspecified as to partial versus complete obstruction: Secondary | ICD-10-CM | POA: Diagnosis not present

## 2023-06-10 DIAGNOSIS — I2510 Atherosclerotic heart disease of native coronary artery without angina pectoris: Secondary | ICD-10-CM | POA: Diagnosis not present

## 2023-06-10 DIAGNOSIS — I350 Nonrheumatic aortic (valve) stenosis: Secondary | ICD-10-CM | POA: Diagnosis not present

## 2023-06-10 DIAGNOSIS — K259 Gastric ulcer, unspecified as acute or chronic, without hemorrhage or perforation: Secondary | ICD-10-CM | POA: Diagnosis not present

## 2023-06-10 DIAGNOSIS — K2960 Other gastritis without bleeding: Secondary | ICD-10-CM

## 2023-06-10 DIAGNOSIS — K319 Disease of stomach and duodenum, unspecified: Secondary | ICD-10-CM

## 2023-06-10 DIAGNOSIS — K317 Polyp of stomach and duodenum: Secondary | ICD-10-CM | POA: Insufficient documentation

## 2023-06-10 DIAGNOSIS — K3189 Other diseases of stomach and duodenum: Secondary | ICD-10-CM | POA: Insufficient documentation

## 2023-06-10 DIAGNOSIS — K219 Gastro-esophageal reflux disease without esophagitis: Secondary | ICD-10-CM | POA: Diagnosis not present

## 2023-06-10 DIAGNOSIS — Z951 Presence of aortocoronary bypass graft: Secondary | ICD-10-CM | POA: Diagnosis not present

## 2023-06-10 DIAGNOSIS — K2289 Other specified disease of esophagus: Secondary | ICD-10-CM | POA: Diagnosis not present

## 2023-06-10 DIAGNOSIS — I899 Noninfective disorder of lymphatic vessels and lymph nodes, unspecified: Secondary | ICD-10-CM

## 2023-06-10 DIAGNOSIS — J45909 Unspecified asthma, uncomplicated: Secondary | ICD-10-CM | POA: Diagnosis not present

## 2023-06-10 DIAGNOSIS — K2970 Gastritis, unspecified, without bleeding: Secondary | ICD-10-CM | POA: Insufficient documentation

## 2023-06-10 HISTORY — PX: EUS: SHX5427

## 2023-06-10 HISTORY — PX: ESOPHAGOGASTRODUODENOSCOPY: SHX5428

## 2023-06-10 LAB — GLUCOSE, CAPILLARY: Glucose-Capillary: 86 mg/dL (ref 70–99)

## 2023-06-10 SURGERY — EGD (ESOPHAGOGASTRODUODENOSCOPY)
Anesthesia: Monitor Anesthesia Care

## 2023-06-10 MED ORDER — PROPOFOL 500 MG/50ML IV EMUL
INTRAVENOUS | Status: DC | PRN
  Administered 2023-06-10: 150 ug/kg/min via INTRAVENOUS

## 2023-06-10 MED ORDER — SUCRALFATE 1 G PO TABS
1.0000 g | ORAL_TABLET | Freq: Two times a day (BID) | ORAL | 0 refills | Status: AC
Start: 2023-06-10 — End: 2023-09-26

## 2023-06-10 MED ORDER — PANTOPRAZOLE SODIUM 40 MG PO TBEC: 40.0000 mg | DELAYED_RELEASE_TABLET | Freq: Two times a day (BID) | ORAL | 12 refills | Status: AC

## 2023-06-10 MED ORDER — DEXMEDETOMIDINE HCL IN NACL 80 MCG/20ML IV SOLN
INTRAVENOUS | Status: DC | PRN
  Administered 2023-06-10: 8 ug via INTRAVENOUS
  Administered 2023-06-10: 4 ug via INTRAVENOUS

## 2023-06-10 MED ORDER — METOPROLOL TARTRATE 12.5 MG HALF TABLET
12.5000 mg | ORAL_TABLET | ORAL | Status: AC
  Administered 2023-06-10: 12.5 mg via ORAL
  Filled 2023-06-10: qty 1

## 2023-06-10 MED ORDER — SODIUM CHLORIDE 0.9 % IV SOLN: INTRAVENOUS | Status: DC

## 2023-06-10 MED ORDER — PROPOFOL 10 MG/ML IV BOLUS
INTRAVENOUS | Status: DC | PRN
  Administered 2023-06-10: 40 mg via INTRAVENOUS

## 2023-06-10 MED ORDER — PHENYLEPHRINE 80 MCG/ML (10ML) SYRINGE FOR IV PUSH (FOR BLOOD PRESSURE SUPPORT)
PREFILLED_SYRINGE | INTRAVENOUS | Status: DC | PRN
  Administered 2023-06-10 (×2): 80 ug via INTRAVENOUS

## 2023-06-10 NOTE — Discharge Instructions (Signed)

## 2023-06-10 NOTE — H&P (Signed)
 GASTROENTEROLOGY PROCEDURE H&P NOTE   Primary Care Physician: Annette Barters, MD  HPI: Shelley Berg is a 78 y.o. female who presents for EGD/EUS to evaluate a duodenal SEL/Nodule.  Past Medical History:  Diagnosis Date   Aortic stenosis    a. 05/2020 Echo: EF 70-75%, GrI DD, mod-sev AS; b. 08/2020 Cath: AS peak grad w/ AvA 1.00 cm^2; c. 11/2020 SAVR: s/p 21mm Edwards INSPIRIS RESILIA pericardial valve.   Asthma    as a child, no problems as an adult, no inhalers   CAD (coronary artery disease)    a. 08/2020 Cath: LM nl, LAD 60p, 74m, LCX 30ost/p, 51m, RCA 13m w/ L->R collats filling RPDA from LAD; b. 11/2020 CABG x 2 (w/ AVR): VG->OM, VG->RPDA.   Dyspnea    with exertion   GERD (gastroesophageal reflux disease)    Heart murmur    Hyperlipidemia    Hypertension    Obesity    Pre-diabetes    Rheumatoid arthritis (HCC)    Seasonal allergies    Past Surgical History:  Procedure Laterality Date   AORTIC VALVE REPLACEMENT N/A 11/11/2020   Procedure: AORTIC VALVE REPLACEMENT (AVR) WITH INSPIRIS RESILIA AORTIC VALVE SIZE ;  Surgeon: Bartley Lightning, MD;  Location: [facility] OR;  Service: Open Heart Surgery;  Laterality: N/A;   BONE BIOPSY  05/05/2023   Procedure: BIOPSY, GASTRIC;  Surgeon: Nannette Babe, MD;  Location: [facility] ENDOSCOPY;  Service: Gastroenterology;;   COLONOSCOPY N/A 05/05/2023   Procedure: COLONOSCOPY;  Surgeon: Nannette Babe, MD;  Location: [facility] ENDOSCOPY;  Service: Gastroenterology;  Laterality: N/A;   CORONARY ARTERY BYPASS GRAFT N/A 11/11/2020   Procedure: CORONARY ARTERY BYPASS GRAFTING (CABG) X  2 ON PUMP USING RIGHT ENDOSCOPIC GREATER SAPHENOUS VEIN CONDUITS;  Surgeon: Bartley Lightning, MD;  Location: [facility] OR;  Service: Open Heart Surgery;  Laterality: N/A;   ENDOVEIN HARVEST OF GREATER SAPHENOUS VEIN Right 11/11/2020   Procedure: ENDOVEIN HARVEST OF GREATER SAPHENOUS VEIN;  Surgeon: Bartley Lightning, MD;  Location: [facility] OR;  Service: Open Heart Surgery;   Laterality: Right;   ESOPHAGOGASTRODUODENOSCOPY N/A 05/05/2023   Procedure: EGD (ESOPHAGOGASTRODUODENOSCOPY);  Surgeon: Nannette Babe, MD;  Location[facility] ENDOSCOPY;  Service: Gastroenterology;  Laterality: N/A;   POLYPECTOMY  05/05/2023   Procedure: POLYPECTOMY, INTESTINE;  Surgeon: Nannette Babe, MD;  Location: [facility] ENDOSCOPY;  Service: Gastroenterology;;   RIGHT/LEFT HEART CATH AND CORONARY ANGIOGRAPHY N/A 08/10/2020   Procedure: RIGHT/LEFT HEART CATH AND CORONARY ANGIOGRAPHY;  Surgeon: Wenona Hamilton, MD;  Location: [facility] INVASIVE CV LAB;  Service: Cardiovascular;  Laterality: N/A;   TEE WITHOUT CARDIOVERSION N/A 11/11/2020   Procedure: TRANSESOPHAGEAL ECHOCARDIOGRAM (TEE);  Surgeon: Bartley Lightning, MD;  Location: [facility] OR;  Service: Open Heart Surgery;  Laterality: N/A;   Current Facility-Administered Medications  Medication Dose Route Frequency Provider Last Rate Last Admin   0.9 %  sodium chloride  infusion   Intravenous Continuous Mansouraty, Mayo Faulk Jr., MD        Current Facility-Administered Medications:    0.9 %  sodium chloride  infusion, , Intravenous, Continuous, Mansouraty, Albino Alu., MD Allergies  Allergen Reactions   Penicillin G Other (See Comments)   Penicillin G Sodium Other (See Comments)   Penicillins Rash   Family History  Problem Relation Age of Onset   Stroke Mother    Stroke Father    Heart attack Father    Social History   Socioeconomic History   Marital status: Widowed    Spouse name:  Not on file   Number of children: Not on file   Years of education: Not on file   Highest education level: Not on file  Occupational History   Not on file  Tobacco Use   Smoking status: Never    Passive exposure: Never   Smokeless tobacco: Never  Vaping Use   Vaping status: Never Used  Substance and Sexual Activity   Alcohol use: Never   Drug use: Never   Sexual activity: Not on file  Other Topics Concern   Not on file  Social History Narrative   Not on file    Social Drivers of Health   Financial Resource Strain: Not on file  Food Insecurity: No Food Insecurity (05/03/2023)   Hunger Vital Sign    Worried About Running Out of Food in the Last Year: Never true    Ran Out of Food in the Last Year: Never true  Transportation Needs: No Transportation Needs (05/03/2023)   PRAPARE - Administrator, Civil Service (Medical): No    Lack of Transportation (Non-Medical): No  Physical Activity: Not on file  Stress: Not on file  Social Connections: Socially Integrated (05/04/2023)   Social Connection and Isolation Panel [NHANES]    Frequency of Communication with Friends and Family: Twice a week    Frequency of Social Gatherings with Friends and Family: Twice a week    Attends Religious Services: 1 to 4 times per year    Active Member of [employer] or Organizations: Yes    Attends Banker Meetings: 1 to 4 times per year    Marital Status: Married  Catering manager Violence: Not At Risk (05/03/2023)   Humiliation, Afraid, Rape, and Kick questionnaire    Fear of Current or Ex-Partner: No    Emotionally Abused: No    Physically Abused: No    Sexually Abused: No    Physical Exam: Today's Vitals   06/04/23 1205  Weight: 68 kg   Body mass index is 30.28 kg/m. GEN: NAD EYE: Sclerae anicteric ENT: MMM CV: Non-tachycardic GI: Soft, NT/ND NEURO:  Alert & Oriented x 3  Lab Results: No results for input(s): "WBC", "HGB", "HCT", "PLT" in the last 72 hours. BMET No results for input(s): "NA", "K", "CL", "CO2", "GLUCOSE", "BUN", "CREATININE", "CALCIUM " in the last 72 hours. LFT No results for input(s): "PROT", "ALBUMIN ", "AST", "ALT", "ALKPHOS", "BILITOT", "BILIDIR", "IBILI" in the last 72 hours. PT/INR No results for input(s): "LABPROT", "INR" in the last 72 hours.   Impression / Plan: This is a 78 y.o.female who presents for EGD/EUS to evaluate a duodenal SEL/Nodule.  The risks of an EUS including intestinal perforation,  bleeding, infection, aspiration, and medication effects were discussed as was the possibility it may not give a definitive diagnosis if a biopsy is performed.  When a biopsy of the pancreas is done as part of the EUS, there is an additional risk of pancreatitis at the rate of about 1-2%.  It was explained that procedure related pancreatitis is typically mild, although it can be severe and even life threatening, which is why we do not perform random pancreatic biopsies and only biopsy a lesion/area we feel is concerning enough to warrant the risk.  The risks and benefits of endoscopic evaluation/treatment were discussed with the patient and/or family; these include but are not limited to the risk of perforation, infection, bleeding, missed lesions, lack of diagnosis, severe illness requiring hospitalization, as well as anesthesia and sedation related illnesses.  The  patient's history has been reviewed, patient examined, no change in status, and deemed stable for procedure.  The patient and/or family is agreeable to proceed.    Yong Henle, MD [facility] Office # [phone]

## 2023-06-10 NOTE — Transfer of Care (Signed)
 Immediate Anesthesia Transfer of Care Note  Patient: Shelley Berg  Procedure(s) Performed: EGD (ESOPHAGOGASTRODUODENOSCOPY) ULTRASOUND, UPPER GI TRACT, ENDOSCOPIC  Patient Location: PACU  Anesthesia Type:General  Level of Consciousness: awake, alert , and oriented  Airway & Oxygen Therapy: Patient Spontanous Breathing  Post-op Assessment: Report given to RN and Post -op Vital signs reviewed and stable  Post vital signs: Reviewed and stable  Last Vitals:  Vitals Value Taken Time  BP 111/45 06/10/23 1358  Temp    Pulse 55 06/10/23 1401  Resp 25 06/10/23 1401  SpO2 98 % 06/10/23 1401  Vitals shown include unfiled device data.  Last Pain:  Vitals:   06/10/23 1241  TempSrc: Oral  PainSc: 0-No pain      Patients Stated Pain Goal: 0 (06/10/23 1241)  Complications: No notable events documented.

## 2023-06-10 NOTE — Anesthesia Postprocedure Evaluation (Signed)
 Anesthesia Post Note  Patient: Shelley Berg  Procedure(s) Performed: EGD (ESOPHAGOGASTRODUODENOSCOPY) ULTRASOUND, UPPER GI TRACT, ENDOSCOPIC     Patient location during evaluation: Endoscopy Anesthesia Type: MAC Level of consciousness: awake and alert Pain management: pain level controlled Vital Signs Assessment: post-procedure vital signs reviewed and stable Respiratory status: spontaneous breathing, nonlabored ventilation and respiratory function stable Cardiovascular status: stable and blood pressure returned to baseline Postop Assessment: no apparent nausea or vomiting Anesthetic complications: no  No notable events documented.  Last Vitals:  Vitals:   06/10/23 1410 06/10/23 1420  BP: 119/61 119/67  Pulse: (!) 53 (!) 55  Resp: 18 16  Temp:    SpO2: 97% 97%    Last Pain:  Vitals:   06/10/23 1420  TempSrc:   PainSc: 0-No pain                 Kemarion Abbey,W. EDMOND

## 2023-06-10 NOTE — Op Note (Signed)
 [facility] Patient Name: Shelley Berg Procedure Date: 06/10/2023 MRN: [id number] Attending MD: Yong Henle , MD, [id number] Date of Birth: 1945/11/30 CSN: [id number] Age: 78 Admit Type: Outpatient Procedure:                Upper EUS Indications:              Duodenal deformity on endoscopy/Subepithelial tumor                            vs. extrinsic compression Providers:                Yong Henle, MD, Ambrose Junk, RN, Judith Novak,                            Technician Referring MD:              Medicines:                Monitored Anesthesia Care Complications:            No immediate complications. Estimated Blood Loss:     Estimated blood loss was minimal. Procedure:                Pre-Anesthesia Assessment:                           - Prior to the procedure, a History and Physical                            was performed, and patient medications and                            allergies were reviewed. The patient's tolerance of                            previous anesthesia was also reviewed. The risks                            and benefits of the procedure and the sedation                            options and risks were discussed with the patient.                            All questions were answered, and informed consent                            was obtained. Prior Anticoagulants: The patient has                            taken no anticoagulant or antiplatelet agents                            except for aspirin . ASA Grade Assessment: III - A                            patient  with severe systemic disease. After                            reviewing the risks and benefits, the patient was                            deemed in satisfactory condition to undergo the                            procedure.                           After obtaining informed consent, the endoscope was                            passed under direct vision. Throughout the                             procedure, the patient's blood pressure, pulse, and                            oxygen saturations were monitored continuously. The                            [id number]-[id number] ([id number]) Olympus endoscope was introduced                            through the mouth, and advanced to the second part                            of duodenum. The GF-UE190-[id number] ([id number] Olympus                            radial ultrasound scope was introduced through the                            mouth, and advanced to the duodenum for ultrasound                            examination from the stomach and duodenum. The                            upper EUS was accomplished without difficulty. The                            patient tolerated the procedure. Scope In: Scope Out: Findings:      ENDOSCOPIC FINDING: :      No gross lesions were noted in the entire esophagus.      The Z-line was irregular and was found 35 cm from the incisors.      A non-obstructing Schatzki ring was found at the gastroesophageal       junction.      A 3 cm hiatal hernia was present.      Segmental moderate mucosal changes characterized by congestion,  inflammation, altered texture and ulceration were found on the posterior       wall of the stomach. Biopsies were taken with a cold forceps for       histology.      A moderate extrinsic deformity was found in the duodenal bulb.      On top of this deformity, a single 20 mm semi-sessile polypoid lesion       with no bleeding was found in the duodenal bulb. After the EUS was       completed, the polyp was removed with a cold snare. Resection and       retrieval were complete. To prevent bleeding post-intervention, two       hemostatic clips were successfully placed (MR conditional). Clip       manufacturer: [employer]. There was no bleeding at the end of the       procedure.      No other gross lesions were noted in the visualized duodenal bulb, in        the first portion of the duodenum and in the second portion of the       duodenum.      ENDOSONOGRAPHIC FINDING: :      Localized wall thickening was visualized endosonographically in the       duodenal bulb. This appeared to primarily be due to thickening within       the luminal interface/superficial mucosa (Layer 1) and deep mucosa       (Layer 2). The thickness of the abnormal layers. The duodenal wall       measured up to 5 mm in thickness and measured 18 mm in length.      There was extrinsic compression in the duodenal bulb. Endosonographic       examination showed this to be due to a normal-appearing gallbladder.      Endosonographic imaging in the visualized portion of the liver showed no       mass.      No malignant-appearing lymph nodes were visualized in the celiac region       (level 20), perigastric region and porta hepatis region.      The celiac region was visualized. Impression:               EGD impression:                           - No gross lesions in the entire esophagus. Z-line                            irregular, 35 cm from the incisors.                           - Non-obstructing Schatzki ring.                           - 3 cm hiatal hernia.                           - Congested, inflamed, texture changed and                            ulcerated mucosa in the posterior wall of the  stomach. Biopsied.                           - Duodenal extrinsic impression deformity in the                            bulb. On top of this was a single duodenal polypoid                            lesion. Resected and retrieved. Clips (MR                            conditional) were placed. Clip manufacturer: [employer].                           - No other visualized gross lesions in the duodenal                            bulb, in the first portion of the duodenum and in                            the second portion of  the duodenum.                           EUS impression:                           - Wall thickening was seen in the duodenal bulb. It                            appeared to primarily be within the luminal                            interface/superficial mucosa (Layer 1) and deep                            mucosa (Layer 2).                           - Extrinsic impression/compression was noted in the                            duodenal bulb due to a normal-appearing gallbladder.                           - No malignant-appearing lymph nodes were                            visualized in the celiac region (level 20),                            perigastric region and porta hepatis region. Moderate  Sedation:      Not Applicable - Patient had care per Anesthesia. Recommendation:           - The patient will be observed post-procedure,                            until all discharge criteria are met.                           - Discharge patient to home.                           - Patient has a contact number available for                            emergencies. The signs and symptoms of potential                            delayed complications were discussed with the                            patient. Return to normal activities tomorrow.                            Written discharge instructions were provided to the                            patient.                           - Full liquid diet today.                           - Observe patient's clinical course.                           - Increase PPI to twice daily.                           - Carafate 1 g twice daily for 2 weeks. Then may                            stop.                           - Await path results.                           - If there is evidence of dysplastic changes, then                            follow-up endoscopy will be recommended. Otherwise,                            if this ends up being inflammatory in  nature no  additional follow-up will be recommended                            necessarily. However, expect the chance that this                            could recur in the future if this is inflammatory                            in nature.                           - The findings and recommendations were discussed                            with the patient.                           - The findings and recommendations were discussed                            with the designated responsible adult. Procedure Code(s):        --- Professional ---                           [phone], Esophagogastroduodenoscopy, flexible,                            transoral; with removal of tumor(s), polyp(s), or                            other lesion(s) by snare technique                           43237, Esophagogastroduodenoscopy, flexible,                            transoral; with endoscopic ultrasound examination                            limited to the esophagus, stomach or duodenum, and                            adjacent structures                           43239, 59, Esophagogastroduodenoscopy, flexible,                            transoral; with biopsy, single or multiple Diagnosis Code(s):        --- Professional ---                           K22.89, Other specified disease of esophagus                           K22.2, Esophageal obstruction  K44.9, Diaphragmatic hernia without obstruction or                            gangrene                           K29.70, Gastritis, unspecified, without bleeding                           K31.89, Other diseases of stomach and duodenum                           K25.9, Gastric ulcer, unspecified as acute or                            chronic, without hemorrhage or perforation                           K31.7, Polyp of stomach and duodenum                           K56.699, Other intestinal obstruction  unspecified                            as to partial versus complete obstruction                           I89.9, Noninfective disorder of lymphatic vessels                            and lymph nodes, unspecified CPT copyright 2022 [employer]. All rights reserved. The codes documented in this report are preliminary and upon coder review may  be revised to meet current compliance requirements. Yong Henle, MD 06/10/2023 2:11:45 PM Number of Addenda: 0

## 2023-06-10 NOTE — Anesthesia Preprocedure Evaluation (Addendum)
 Anesthesia Evaluation  Patient identified by MRN, date of birth, ID band Patient awake    Reviewed: Allergy & Precautions, H&P , NPO status , Patient's Chart, lab work & pertinent test results, reviewed documented beta blocker date and time   Airway Mallampati: II  TM Distance: >3 FB Neck ROM: Full    Dental no notable dental hx. (+) Teeth Intact, Dental Advisory Given   Pulmonary asthma    Pulmonary exam normal breath sounds clear to auscultation       Cardiovascular hypertension, Pt. on medications and Pt. on home beta blockers + CAD and + CABG  + Valvular Problems/Murmurs MR  Rhythm:Regular Rate:Normal     Neuro/Psych negative neurological ROS  negative psych ROS   GI/Hepatic Neg liver ROS,GERD  Medicated,,  Endo/Other  negative endocrine ROS    Renal/GU negative Renal ROS  negative genitourinary   Musculoskeletal  (+) Arthritis , Osteoarthritis,    Abdominal   Peds  Hematology  (+) Blood dyscrasia, anemia   Anesthesia Other Findings   Reproductive/Obstetrics negative OB ROS                             Anesthesia Physical Anesthesia Plan  ASA: 3  Anesthesia Plan: MAC   Post-op Pain Management: Minimal or no pain anticipated   Induction: Intravenous  PONV Risk Score and Plan: 2 and Propofol  infusion  Airway Management Planned: Natural Airway and Simple Face Mask  Additional Equipment:   Intra-op Plan:   Post-operative Plan:   Informed Consent: I have reviewed the patients History and Physical, chart, labs and discussed the procedure including the risks, benefits and alternatives for the proposed anesthesia with the patient or authorized representative who has indicated his/her understanding and acceptance.     Dental advisory given  Plan Discussed with: CRNA  Anesthesia Plan Comments:        Anesthesia Quick Evaluation

## 2023-06-12 ENCOUNTER — Encounter: Payer: Self-pay | Admitting: Pathology

## 2023-06-12 LAB — SURGICAL PATHOLOGY

## 2023-06-13 ENCOUNTER — Encounter (HOSPITAL_COMMUNITY): Payer: Self-pay | Admitting: Gastroenterology

## 2023-06-13 ENCOUNTER — Encounter: Payer: Self-pay | Admitting: Internal Medicine

## 2023-06-13 ENCOUNTER — Telehealth: Payer: Self-pay | Admitting: Internal Medicine

## 2023-06-13 NOTE — Telephone Encounter (Signed)
 Inbound call from patient had an EUS with Dr.Mansouraty on 06/10/2023 patient states she needs a root canal and her dentist would like to know if its okay for her to take and antibiotic.  Dentist name is Dr.Vance office phone number is [phone]   Please advise

## 2023-06-13 NOTE — Telephone Encounter (Signed)
 See note below and advise.

## 2023-06-14 NOTE — Telephone Encounter (Signed)
Ok for abx?

## 2023-06-14 NOTE — Telephone Encounter (Signed)
 Dr Park Bolk office aware.

## 2023-06-15 ENCOUNTER — Ambulatory Visit: Payer: Self-pay | Admitting: Gastroenterology

## 2023-06-28 NOTE — Telephone Encounter (Signed)
 Inbound call from patient inquiring if she needs to have a f/u? Please advise.   Thank you

## 2023-08-05 NOTE — Telephone Encounter (Signed)
 Left message on patient's voicemail reminding her that she is due for repeat labwork this week.

## 2023-08-08 ENCOUNTER — Other Ambulatory Visit

## 2023-08-08 DIAGNOSIS — K639 Disease of intestine, unspecified: Secondary | ICD-10-CM | POA: Diagnosis not present

## 2023-08-08 DIAGNOSIS — D509 Iron deficiency anemia, unspecified: Secondary | ICD-10-CM

## 2023-08-08 LAB — IBC + FERRITIN
Ferritin: 51.1 ng/mL (ref 10.0–291.0)
Iron: 34 ug/dL — ABNORMAL LOW (ref 42–145)
Saturation Ratios: 9.1 % — ABNORMAL LOW (ref 20.0–50.0)
TIBC: 373.8 ug/dL (ref 250.0–450.0)
Transferrin: 267 mg/dL (ref 212.0–360.0)

## 2023-08-08 LAB — CBC WITH DIFFERENTIAL/PLATELET
Basophils Absolute: 0.1 K/uL (ref 0.0–0.1)
Basophils Relative: 0.8 % (ref 0.0–3.0)
Eosinophils Absolute: 0.3 K/uL (ref 0.0–0.7)
Eosinophils Relative: 3.3 % (ref 0.0–5.0)
HCT: 39.1 % (ref 36.0–46.0)
Hemoglobin: 12.8 g/dL (ref 12.0–15.0)
Lymphocytes Relative: 22 % (ref 12.0–46.0)
Lymphs Abs: 2 K/uL (ref 0.7–4.0)
MCHC: 32.7 g/dL (ref 30.0–36.0)
MCV: 89.3 fl (ref 78.0–100.0)
Monocytes Absolute: 0.6 K/uL (ref 0.1–1.0)
Monocytes Relative: 6.4 % (ref 3.0–12.0)
Neutro Abs: 6 K/uL (ref 1.4–7.7)
Neutrophils Relative %: 67.5 % (ref 43.0–77.0)
Platelets: 316 K/uL (ref 150.0–400.0)
RBC: 4.38 Mil/uL (ref 3.87–5.11)
RDW: 15.4 % (ref 11.5–15.5)
WBC: 8.9 K/uL (ref 4.0–10.5)

## 2023-08-09 ENCOUNTER — Ambulatory Visit: Payer: Self-pay | Admitting: Internal Medicine

## 2023-08-09 DIAGNOSIS — D509 Iron deficiency anemia, unspecified: Secondary | ICD-10-CM

## 2023-08-14 IMAGING — DX DG CHEST 2V
2 series · 2 of 2 positions shown · non-contrast
Comparison: Previous studies including the examination of
11/16/2020

CLINICAL DATA: Previous coronary bypass surgery

EXAM:
CHEST - 2 VIEW

[dg chest 2 view (1 of 2)]
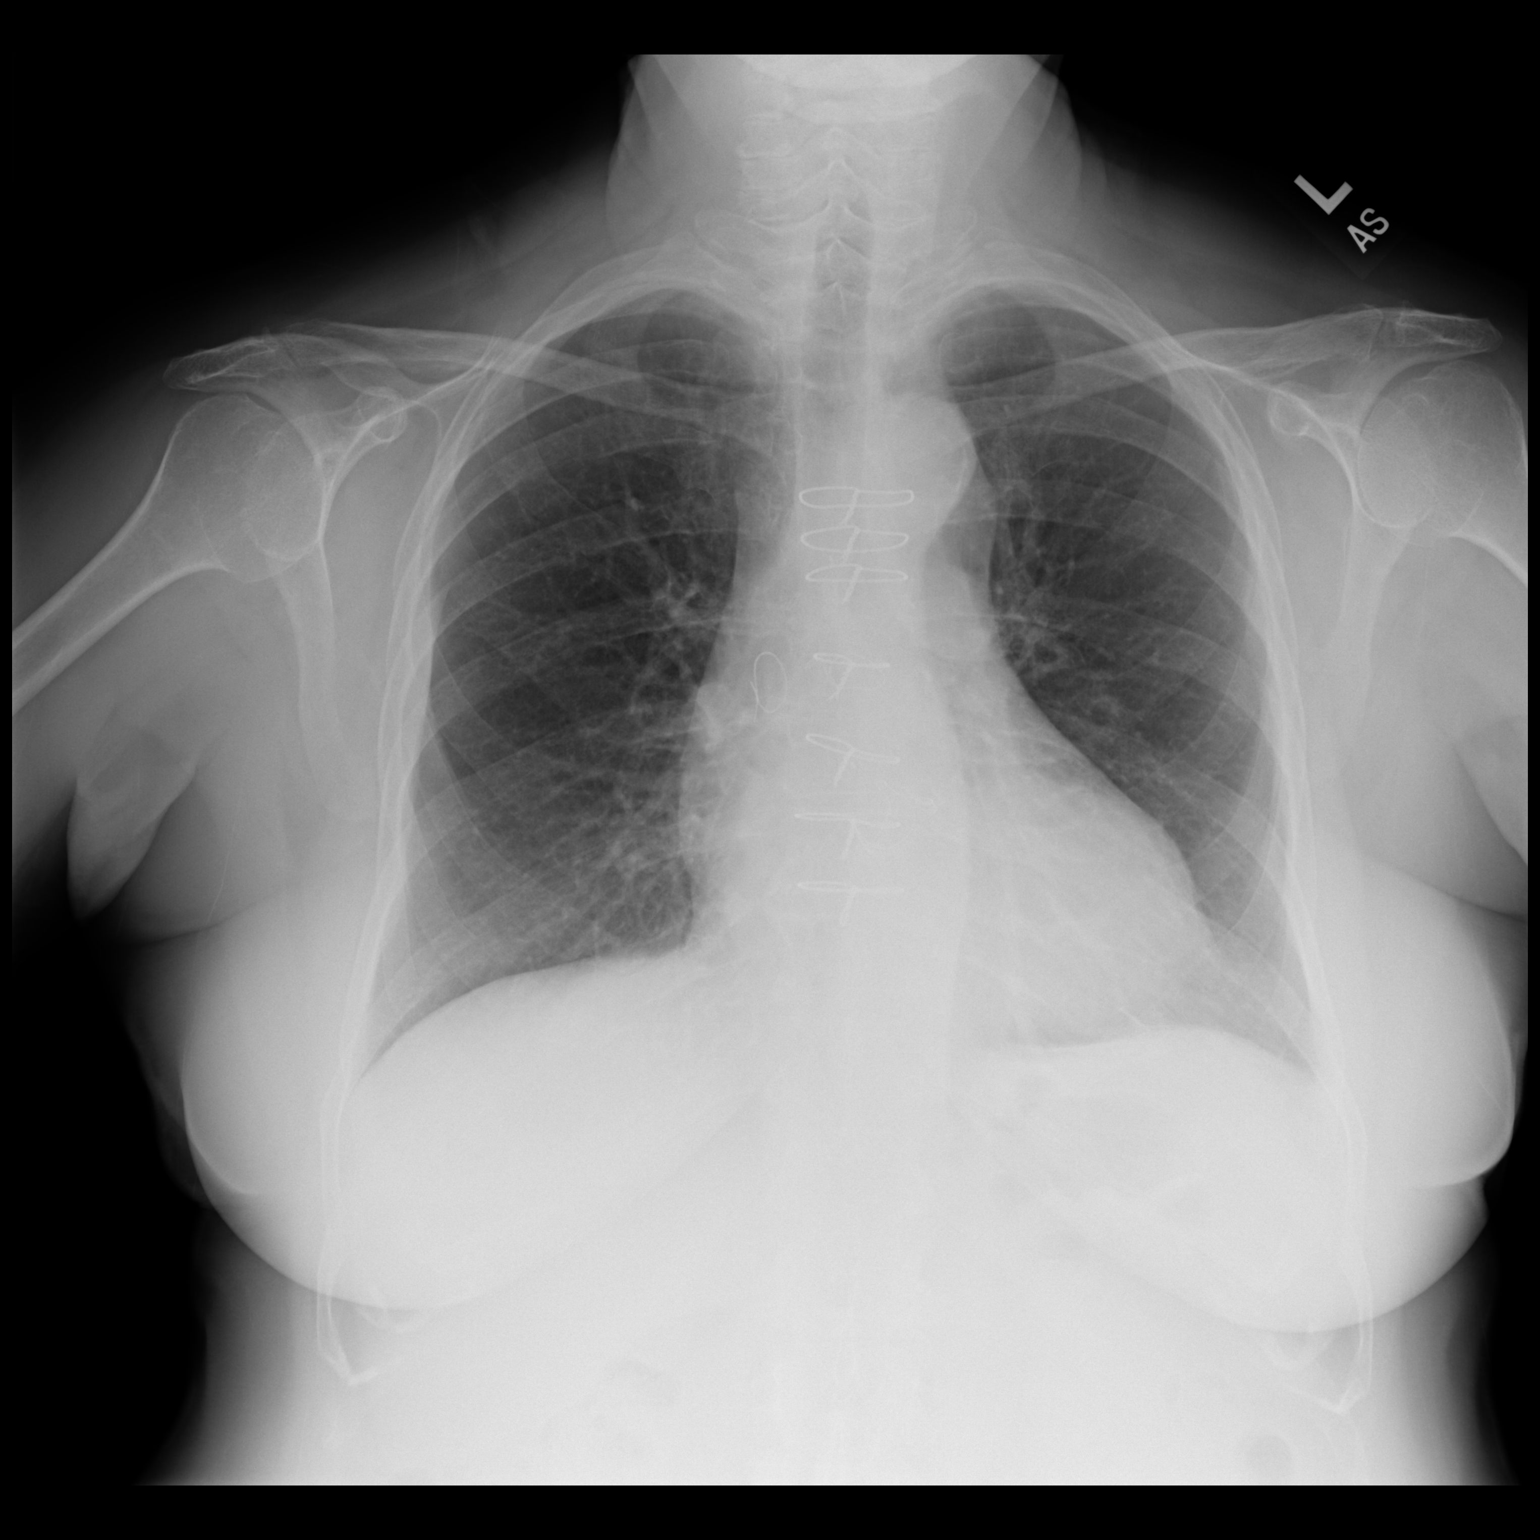

[dg chest 2 view (2 of 2)]
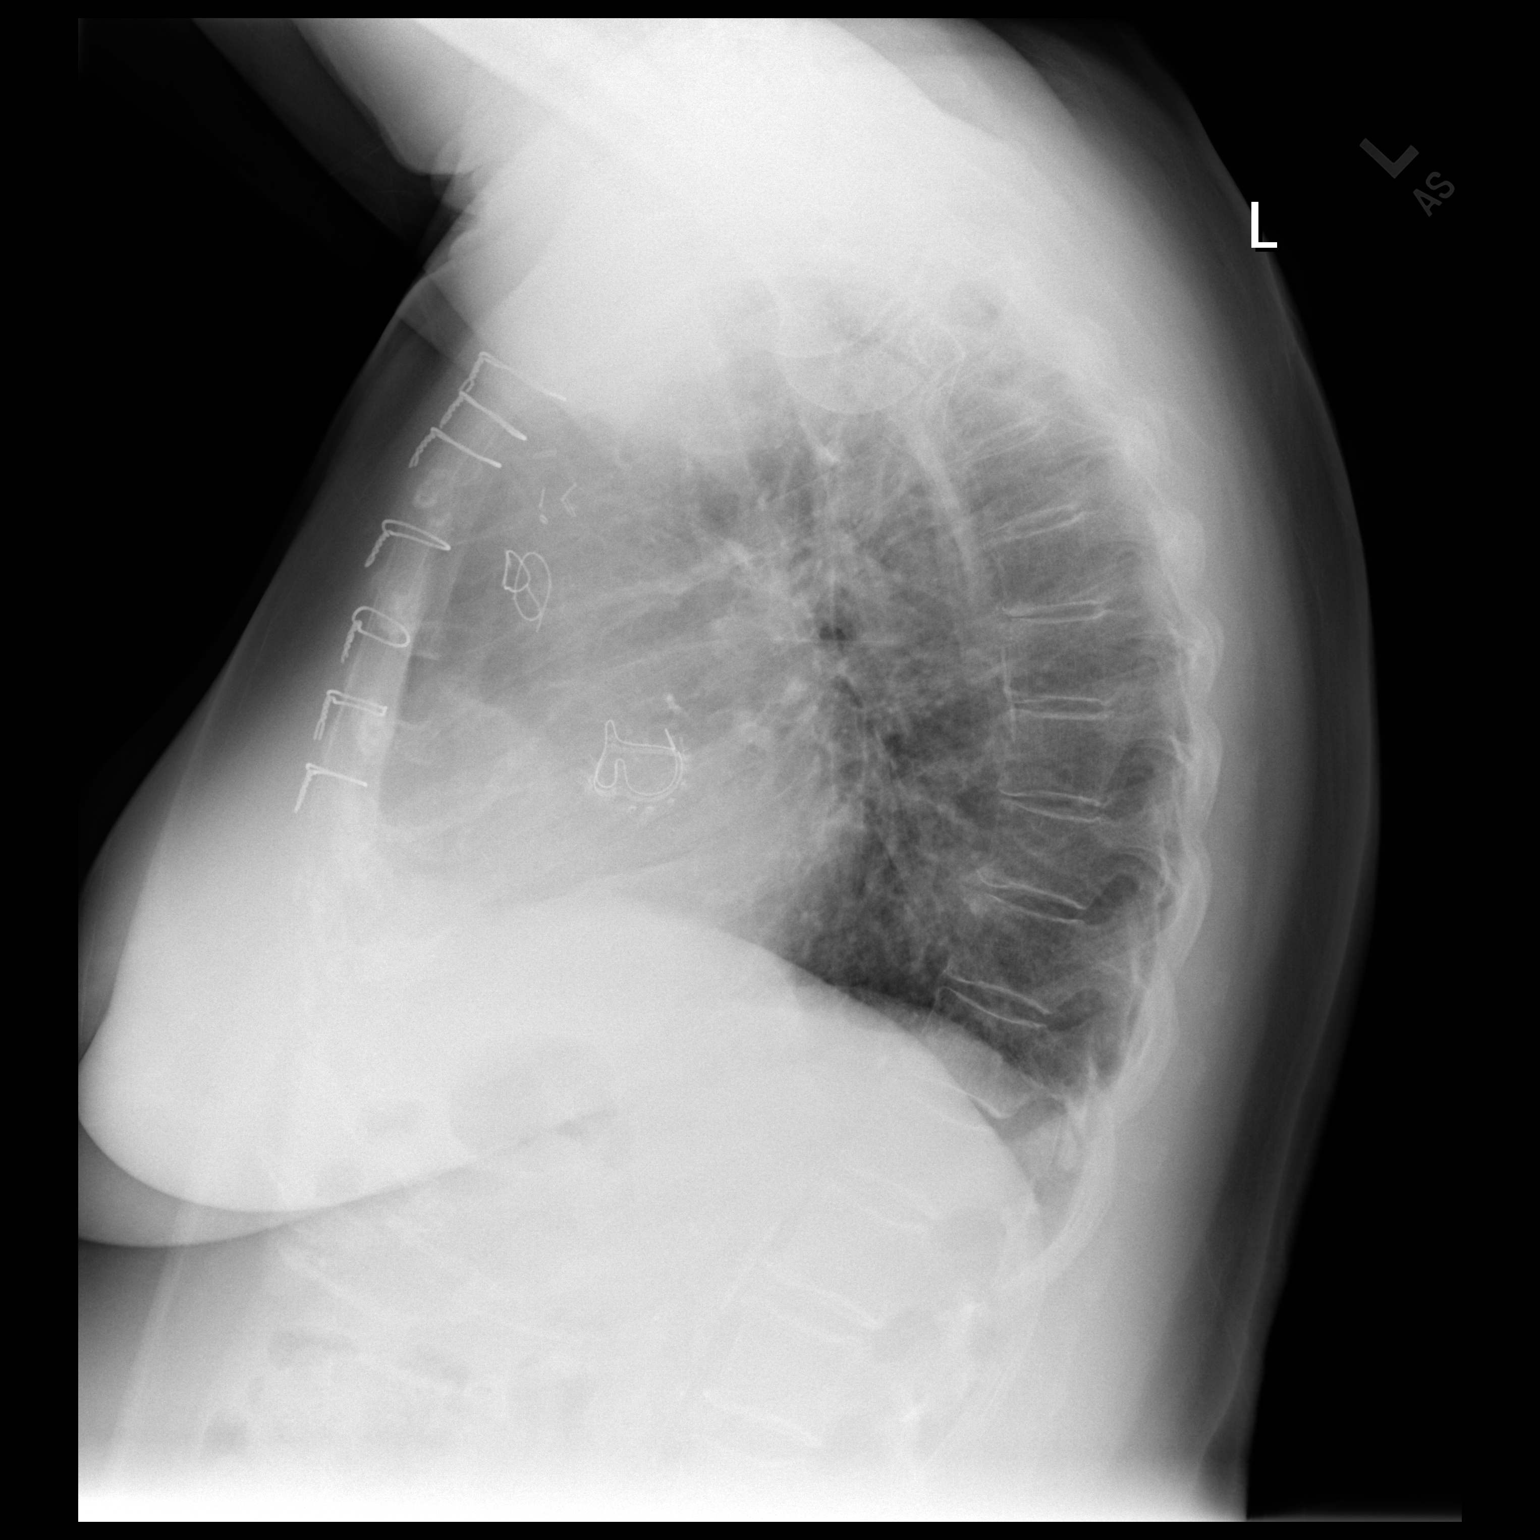

[2 of 2 positions shown; findings below may reference images not displayed]

FINDINGS: Transverse diameter of heart is within normal limits. Thoracic aorta
is tortuous. There are no signs of pulmonary edema or focal
pulmonary consolidation. There is interval resolution of infiltrate
in the medial left lower lung fields. There are air-filled
structures of varying sizes in the medial left upper lung fields
with thick wall. There is evidence of previous coronary bypass
surgery and prosthetic cardiac valve placement.
IMPRESSION: There is previous coronary bypass surgery and placement of
prosthetic intracardiac valve. There are no signs of pulmonary edema
or focal pulmonary consolidation. There are thick-walled air-filled
structures along the medial aspect of left upper lung fields. Please
correlate for possible bronchiectasis or some other infectious
process in the lung fields. If clinically warranted, follow-up CT
may be considered.

## 2023-08-15 DIAGNOSIS — H35723 Serous detachment of retinal pigment epithelium, bilateral: Secondary | ICD-10-CM | POA: Diagnosis not present

## 2023-09-19 DIAGNOSIS — L4050 Arthropathic psoriasis, unspecified: Secondary | ICD-10-CM | POA: Diagnosis not present

## 2023-09-19 DIAGNOSIS — M1990 Unspecified osteoarthritis, unspecified site: Secondary | ICD-10-CM | POA: Diagnosis not present

## 2023-09-19 DIAGNOSIS — M810 Age-related osteoporosis without current pathological fracture: Secondary | ICD-10-CM | POA: Diagnosis not present

## 2023-09-19 DIAGNOSIS — E559 Vitamin D deficiency, unspecified: Secondary | ICD-10-CM | POA: Diagnosis not present

## 2023-09-19 DIAGNOSIS — Z79899 Other long term (current) drug therapy: Secondary | ICD-10-CM | POA: Diagnosis not present

## 2023-09-25 NOTE — Progress Notes (Unsigned)
 Cardiology Office Note    Date:  09/26/2023   ID:  Shelley Berg, DOB 21-Jun-1945, MRN [id number]  PCP:  Stephanie Charlene CROME, MD  Cardiologist:  Redell Cave, MD  Electrophysiologist:  None   Chief Complaint: Follow up  History of Present Illness:   Shelley Berg is a 78 y.o. female with history of CAD s/p CABG x 2 11/2020 (SVG-OM, SVG -PDA), severe AS s/p AVR bioprosthetic 11/2020, hypertension, and hyperlipidemia who presents for follow up on CAD and valvular disease.    Patient was initially evaluated by Dr. Cave after being referred by her PCP for heart murmur with echo 05/2020 showing EF 70-75%, G1DD, mild MR, and moderate to severe AS. At that time she reported strong family history of heart disease with her father having MI in his 42s and brothers with bypass in their 24s. She also reports chest pain consistent with angina. Lexiscan  myoview  07/2020 showed moderate sized region of ischemia in the inferolateral wall with mild hypokinesis in the inferolateral region. Three vessel coronary calcification noted on CT attenuation correction images. She was subsequently sent for [facility] 08/2020 which revealed significant calcified three vessel CAD with chornicall occluding mid RCA with collaterals and borderline LAD disease. RHC showed normal right and left sided filling pressures, mild pulmonary hypertension, and normal cardiac output. Moderate to severe aortic stenosis was noted with peak gradient 42 mmHg and valve area 1.00 cm^2. She was referred to the structural heart clinic and underwent CTA of the chest which showed a probable bicuspid aortic valve with severely reduced cusp separation and severely thickened and calcified valve cusps. She was referred to cardiothoracic surgery and decision was made to pursue SAVR with CABG x2 which was performed 11/2020 with vein graft to the OM and vein graft to the RPDA. Postoperatively, she was anemic and required PRBCs. Repeat echo 12/2020  showed normal functioning aortic valve prosthesis, EF 50-55%. Most recent echo 10/2022 showed EF 55-60% and normally functioning aortic valve prosthesis.    Patient was most recently seen in our clinic 08/2022 and overall doing well from a cardiac perspective. No further testing or medication changes were indicated at that time.   She had a recent hospital admission 05/2023 for symptomatic anemia with hgb 6.4. She received 1 unit PRBCs and IV iron  with symptomatic improvement EGD/colonoscopy showed a submucosal mass type lesion in the duodenum. She has been following with GI as an outpatient for ongoing management.   Patient presents to clinic today overall doing well from a cardiac perspective.  She reports that she is feeling much better since her recent hospitalization and hemoglobin is stabilized.  She continues to take iron .  She denies recent bleeding.  She is without symptoms of angina and cardiac decompensation.  She continues to work 3 days a week at the [employer] and stays busy.  She denies chest pain, shortness of breath, lightheadedness, dizziness, and lower extremity swelling.  Labs independently reviewed: 09/2023- Hgb 11.5, Hct 36.2, platelets 325, Cr 0.64, BUN 13, Na 143, K 4.1, normal LFTs 05/2023- TC 97, TG 78, HDL 56, LDL 25  Objective   Past Medical History:  Diagnosis Date   Aortic stenosis    a. 05/2020 Echo: EF 70-75%, GrI DD, mod-sev AS; b. 08/2020 Cath: AS peak grad w/ AvA 1.00 cm^2; c. 11/2020 SAVR: s/p 21mm Edwards INSPIRIS RESILIA pericardial valve.   Asthma    as a child, no problems as an adult, no inhalers   CAD (coronary  artery disease)    a. 08/2020 Cath: LM nl, LAD 60p, 43m, LCX 30ost/p, 34m, RCA 151m w/ L->R collats filling RPDA from LAD; b. 11/2020 CABG x 2 (w/ AVR): VG->OM, VG->RPDA.   Dyspnea    with exertion   GERD (gastroesophageal reflux disease)    Heart murmur    Hyperlipidemia    Hypertension    Obesity    Pre-diabetes    Rheumatoid arthritis  (HCC)    Seasonal allergies     Current Medications: Current Meds  Medication Sig   acetaminophen  (TYLENOL ) 500 MG tablet Take 1,000 mg by mouth every 6 (six) hours as needed for moderate pain.   alendronate (FOSAMAX) 70 MG tablet Take 70 mg by mouth once a week. Monday   aspirin  81 MG EC tablet Take 1 tablet (81 mg total) by mouth daily.   atorvastatin  (LIPITOR ) 80 MG tablet Take 1 tablet (80 mg total) by mouth daily.   Calcium  Carb-Cholecalciferol (CALCIUM  600+D3 PO) Take 1 tablet by mouth in the morning.   Cholecalciferol (VITAMIN D3) 125 MCG (5000 UT) TABS Take 5,000 Units by mouth.   clobetasol ointment (TEMOVATE) 0.05 % Apply 1 Application topically 2 (two) times daily.   ezetimibe  (ZETIA ) 10 MG tablet Take 1 tablet (10 mg total) by mouth daily.   ferrous sulfate  325 (65 FE) MG tablet Take 1 tablet (325 mg total) by mouth 2 (two) times daily with a meal.   folic acid  (FOLVITE ) 1 MG tablet Take 1 mg by mouth in the morning.   hydroxychloroquine  (PLAQUENIL ) 200 MG tablet Take 400 mg by mouth in the morning.   loratadine  (CLARITIN ) 10 MG tablet Take 10 mg by mouth in the morning.   metFORMIN (GLUCOPHAGE-XR) 500 MG 24 hr tablet Take 500 mg by mouth daily with breakfast.   methotrexate (RHEUMATREX) 2.5 MG tablet Take 10 mg by mouth once a week. Mondays   metoprolol  tartrate (LOPRESSOR ) 25 MG tablet Take 0.5 tablets (12.5 mg total) by mouth 2 (two) times daily.   Omega-3 1000 MG CAPS Take 1,000 mg by mouth in the morning.   OTEZLA  30 MG TABS Take 30 mg by mouth 2 (two) times daily.   pantoprazole  (PROTONIX ) 40 MG tablet Take 1 tablet (40 mg total) by mouth 2 (two) times daily before a meal.   sucralfate  (CARAFATE ) 1 g tablet Take 1 tablet (1 g total) by mouth 2 (two) times daily for 14 days.    Allergies:   Penicillin g, Penicillin g sodium, and Penicillins   Social History   Socioeconomic History   Marital status: Widowed    Spouse name: Not on file   Number of children: Not on  file   Years of education: Not on file   Highest education level: Not on file  Occupational History   Not on file  Tobacco Use   Smoking status: Never    Passive exposure: Never   Smokeless tobacco: Never  Vaping Use   Vaping status: Never Used  Substance and Sexual Activity   Alcohol use: Never   Drug use: Never   Sexual activity: Not on file  Other Topics Concern   Not on file  Social History Narrative   Not on file   Social Drivers of Health   Financial Resource Strain: Not on file  Food Insecurity: No Food Insecurity (05/03/2023)   Hunger Vital Sign    Worried About Running Out of Food in the Last Year: Never true    Ran Out of  Food in the Last Year: Never true  Transportation Needs: No Transportation Needs (05/03/2023)   PRAPARE - Administrator, Civil Service (Medical): No    Lack of Transportation (Non-Medical): No  Physical Activity: Not on file  Stress: Not on file  Social Connections: Socially Integrated (05/04/2023)   Social Connection and Isolation Panel    Frequency of Communication with Friends and Family: Twice a week    Frequency of Social Gatherings with Friends and Family: Twice a week    Attends Religious Services: 1 to 4 times per year    Active Member of [employer] or Organizations: Yes    Attends Banker Meetings: 1 to 4 times per year    Marital Status: Married     Family History:  The patient's family history includes Heart attack in her father; Stroke in her father and mother.  ROS:   12-point review of systems is negative unless otherwise noted in the HPI.  EKGs/Other Studies Reviewed:    Studies reviewed were summarized above. The additional studies were reviewed today:  10/2022 Echo complete 1. Left ventricular ejection fraction, by estimation, is 55 to 60%. The  left ventricle has normal function. The left ventricle has no regional  wall motion abnormalities. There is mild left ventricular hypertrophy.  Left  ventricular diastolic parameters  are consistent with Grade II diastolic dysfunction (pseudonormalization).  The average left ventricular global longitudinal strain is -16.5 %. The  global longitudinal strain is normal.   2. Right ventricular systolic function is normal. The right ventricular  size is normal. Mildly increased right ventricular wall thickness.   3. Left atrial size was moderately dilated.   4. The mitral valve is degenerative. Moderate mitral valve regurgitation.   5. The aortic valve has been repaired/replaced. Aortic valve  regurgitation is not visualized. There is a bioprosthetic valve present in  the aortic position. Aortic valve mean gradient measures 7.5 mmHg.   6. Aortic dilatation noted. There is mild dilatation of the ascending  aorta, measuring 43 mm.   7. The inferior vena cava is normal in size with greater than 50%  respiratory variability, suggesting right atrial pressure of 3 mmHg.   EKG:  Reviewed most recent echo from ED visit 05/2023 which showed normal sinus rhythm with incomplete RBBB, unchanged from prior tracings.  PHYSICAL EXAM:    VS:  BP 108/60 (BP Location: Left Arm, Patient Position: Sitting, Cuff Size: Normal)   Pulse 67   Ht 4' 11 (1.499 m)   Wt 139 lb 8 oz (63.3 kg)   SpO2 99%   BMI 28.18 kg/m   BMI: Body mass index is 28.18 kg/m.  GEN: Well nourished, well developed in no acute distress NECK: No JVD; No carotid bruits CARDIAC: RRR, I/VI systolic murmur,  no rubs or gallops RESPIRATORY:  Clear to auscultation without rales, wheezing or rhonchi  ABDOMEN: Soft, non-tender, non-distended EXTREMITIES: No edema; No deformity  Wt Readings from Last 3 Encounters:  09/26/23 139 lb 8 oz (63.3 kg)  06/10/23 143 lb (64.9 kg)  05/05/23 150 lb (68 kg)        ASSESSMENT & PLAN:   Coronary artery disease - S/p CABG x 2 in 11/2020. Most recent echo with EF 55-60%. No symptoms of angina or cardiac decompensation. She is continued on ASA 81  mg daily, atorvastatin  80 mg daily, and metoprolol  tartrate 12.5 mg twice daily.   Aortic valve stenosis - S/p bioprosthetic aortic valve replacement 11/2020. Most recent  echo 10/2022 with stable normal functioning aortic valve prosthesis.   Hypertension - BP well controlled on metoprolol  tartrate.   Hyperlipidemia - Most recent lipid panel 05/2023 with LDL 25. Discontinue Zetia . Continue atorvastatin  as above. She has an appointment with her PCP and will plan to have repeat lipid panel at that time.   Moderate mitral regurgitation - Mild MR initially noted on echo in 2022. Most recent echo 10/2022 with moderate MR. Patient denies dyspnea. Plan for repeat echo at follow up.      Disposition: F/u with Dr. Darliss or an APP in 1 year.   Medication Adjustments/Labs and Tests Ordered: Current medicines are reviewed at length with the patient today.  Concerns regarding medicines are outlined above. Medication changes, Labs and Tests ordered today are summarized above and listed in the Patient Instructions accessible in Encounters.   Bonney Lesley Maffucci, PA-C 09/26/2023 1:48 PM     [facility] [place] [address] [place], [place] [address] [phone]

## 2023-09-26 ENCOUNTER — Encounter: Payer: Self-pay | Admitting: Nurse Practitioner

## 2023-09-26 ENCOUNTER — Ambulatory Visit: Attending: Nurse Practitioner | Admitting: Physician Assistant

## 2023-09-26 VITALS — BP 108/60 | HR 67 | Ht 59.0 in | Wt 139.5 lb

## 2023-09-26 DIAGNOSIS — I340 Nonrheumatic mitral (valve) insufficiency: Secondary | ICD-10-CM | POA: Diagnosis not present

## 2023-09-26 DIAGNOSIS — E785 Hyperlipidemia, unspecified: Secondary | ICD-10-CM

## 2023-09-26 DIAGNOSIS — Z951 Presence of aortocoronary bypass graft: Secondary | ICD-10-CM | POA: Diagnosis not present

## 2023-09-26 DIAGNOSIS — I10 Essential (primary) hypertension: Secondary | ICD-10-CM | POA: Diagnosis not present

## 2023-09-26 DIAGNOSIS — Z952 Presence of prosthetic heart valve: Secondary | ICD-10-CM

## 2023-09-26 DIAGNOSIS — I2510 Atherosclerotic heart disease of native coronary artery without angina pectoris: Secondary | ICD-10-CM

## 2023-09-26 NOTE — Patient Instructions (Addendum)
 Medication Instructions:   Your physician recommends the following medication changes.  STOP TAKING: Zetia   *If you need a refill on your cardiac medications before your next appointment, please call your pharmacy*  Lab Work: No labs ordered today  If you have labs (blood work) drawn today and your tests are completely normal, you will receive your results only by: MyChart Message (if you have MyChart) OR A paper copy in the mail If you have any lab test that is abnormal or we need to change your treatment, we will call you to review the results.  Testing/Procedures: Need to Schedule an Echocardiogram in 1 year.  Follow-Up: At [facility], you and your health needs are our priority.  As part of our continuing mission to provide you with exceptional heart care, our providers are all part of one team.  This team includes your primary Cardiologist (physician) and Advanced Practice Providers or APPs (Physician Assistants and Nurse Practitioners) who all work together to provide you with the care you need, when you need it.  Your next appointment:   12 month(s)  Provider:   You may see Redell Cave, MD or one of the following Advanced Practice Providers on your designated Care Team:   Lonni Meager, NP Lesley Maffucci, PA-C   We recommend signing up for the patient portal called MyChart.  Sign up information is provided on this After Visit Summary.  MyChart is used to connect with patients for Virtual Visits (Telemedicine).  Patients are able to view lab/test results, encounter notes, upcoming appointments, etc.  Non-urgent messages can be sent to your provider as well.   To learn more about what you can do with MyChart, go to [email or web address].

## 2023-09-27 ENCOUNTER — Encounter: Payer: Self-pay | Admitting: *Deleted

## 2023-09-27 ENCOUNTER — Telehealth: Payer: Self-pay | Admitting: Physician Assistant

## 2023-09-27 NOTE — Telephone Encounter (Signed)
 Pt received AVS yesterday. Part of it was hers and part of it belonged to another pt. Pt would like a call back

## 2023-09-27 NOTE — Telephone Encounter (Signed)
 Returned pt's call.  I was the nurse that checked the patient out and gave the AVS without realizing until after she had left that I had mixed up the paperwork.  The information in pt's MyChart and medical record is correct, I had a note to call her this morning, but she had called prior to me calling.  Pt states she received extra papers that was not hers. Both pt's last names started with Johnita and due to printer jam, I inadvertently mixed the papers up when stapling pt's AVS.  I apologized and pt states that she would destroy the papers.  Pt requested that her AVS be mailed to her because she keeps her medical information.I reassured her that I would mail her a copy of the correct AVS.

## 2023-09-28 ENCOUNTER — Other Ambulatory Visit: Payer: Self-pay | Admitting: Cardiology

## 2023-11-14 ENCOUNTER — Encounter: Payer: Self-pay | Admitting: Cardiology

## 2023-11-18 ENCOUNTER — Other Ambulatory Visit: Payer: Self-pay | Admitting: Cardiology

## 2024-02-03 ENCOUNTER — Encounter: Payer: Self-pay | Admitting: *Deleted
